# Patient Record
Sex: Female | Born: 1970
Health system: Southern US, Community
[De-identification: ages and names within clinical notes are randomized; demographics above are authoritative.]

## PROBLEM LIST (undated history)

## (undated) DIAGNOSIS — K219 Gastro-esophageal reflux disease without esophagitis: Secondary | ICD-10-CM

## (undated) DIAGNOSIS — Z87898 Personal history of other specified conditions: Secondary | ICD-10-CM

## (undated) DIAGNOSIS — Z973 Presence of spectacles and contact lenses: Secondary | ICD-10-CM

## (undated) DIAGNOSIS — N200 Calculus of kidney: Secondary | ICD-10-CM

## (undated) DIAGNOSIS — N1330 Unspecified hydronephrosis: Secondary | ICD-10-CM

## (undated) DIAGNOSIS — Z87442 Personal history of urinary calculi: Secondary | ICD-10-CM

## (undated) DIAGNOSIS — E785 Hyperlipidemia, unspecified: Secondary | ICD-10-CM

## (undated) DIAGNOSIS — I10 Essential (primary) hypertension: Secondary | ICD-10-CM

## (undated) DIAGNOSIS — D3501 Benign neoplasm of right adrenal gland: Secondary | ICD-10-CM

## (undated) DIAGNOSIS — I700 Atherosclerosis of aorta: Secondary | ICD-10-CM

## (undated) DIAGNOSIS — Z8669 Personal history of other diseases of the nervous system and sense organs: Secondary | ICD-10-CM

## (undated) DIAGNOSIS — R002 Palpitations: Secondary | ICD-10-CM

## (undated) DIAGNOSIS — E7800 Pure hypercholesterolemia, unspecified: Secondary | ICD-10-CM

## (undated) DIAGNOSIS — E119 Type 2 diabetes mellitus without complications: Secondary | ICD-10-CM

## (undated) DIAGNOSIS — K760 Fatty (change of) liver, not elsewhere classified: Secondary | ICD-10-CM

## (undated) DIAGNOSIS — Z8719 Personal history of other diseases of the digestive system: Secondary | ICD-10-CM

## (undated) DIAGNOSIS — I444 Left anterior fascicular block: Secondary | ICD-10-CM

## (undated) DIAGNOSIS — R102 Pelvic and perineal pain: Secondary | ICD-10-CM

## (undated) DIAGNOSIS — N2889 Other specified disorders of kidney and ureter: Secondary | ICD-10-CM

## (undated) DIAGNOSIS — F419 Anxiety disorder, unspecified: Secondary | ICD-10-CM

## (undated) HISTORY — DX: Pelvic and perineal pain: R10.2

## (undated) HISTORY — PX: ABDOMINAL HYSTERECTOMY: SHX81

## (undated) HISTORY — DX: Essential (primary) hypertension: I10

## (undated) HISTORY — DX: Type 2 diabetes mellitus without complications: E11.9

## (undated) HISTORY — PX: WISDOM TOOTH EXTRACTION: SHX21

## (undated) HISTORY — PX: ENDOMETRIAL ABLATION: SHX621

---

## 1999-03-18 ENCOUNTER — Inpatient Hospital Stay (HOSPITAL_COMMUNITY): Admission: AD | Admit: 1999-03-18 | Discharge: 1999-03-18 | Payer: Self-pay | Admitting: Obstetrics and Gynecology

## 1999-03-23 ENCOUNTER — Inpatient Hospital Stay (HOSPITAL_COMMUNITY): Admission: AD | Admit: 1999-03-23 | Discharge: 1999-03-26 | Payer: Self-pay | Admitting: Obstetrics and Gynecology

## 1999-08-05 ENCOUNTER — Other Ambulatory Visit: Admission: RE | Admit: 1999-08-05 | Discharge: 1999-08-05 | Payer: Self-pay | Admitting: Obstetrics and Gynecology

## 2000-08-08 ENCOUNTER — Other Ambulatory Visit: Admission: RE | Admit: 2000-08-08 | Discharge: 2000-08-08 | Payer: Self-pay | Admitting: Obstetrics and Gynecology

## 2000-09-04 ENCOUNTER — Ambulatory Visit (HOSPITAL_COMMUNITY): Admission: RE | Admit: 2000-09-04 | Discharge: 2000-09-04 | Payer: Self-pay | Admitting: Obstetrics and Gynecology

## 2000-09-04 ENCOUNTER — Encounter: Payer: Self-pay | Admitting: Obstetrics and Gynecology

## 2000-09-19 ENCOUNTER — Ambulatory Visit (HOSPITAL_COMMUNITY): Admission: RE | Admit: 2000-09-19 | Discharge: 2000-09-19 | Payer: Self-pay | Admitting: Obstetrics and Gynecology

## 2001-09-04 ENCOUNTER — Other Ambulatory Visit: Admission: RE | Admit: 2001-09-04 | Discharge: 2001-09-04 | Payer: Self-pay | Admitting: Obstetrics and Gynecology

## 2002-10-24 ENCOUNTER — Other Ambulatory Visit: Admission: RE | Admit: 2002-10-24 | Discharge: 2002-10-24 | Payer: Self-pay | Admitting: Obstetrics and Gynecology

## 2003-10-30 ENCOUNTER — Other Ambulatory Visit: Admission: RE | Admit: 2003-10-30 | Discharge: 2003-10-30 | Payer: Self-pay | Admitting: Obstetrics and Gynecology

## 2004-11-02 ENCOUNTER — Other Ambulatory Visit: Admission: RE | Admit: 2004-11-02 | Discharge: 2004-11-02 | Payer: Self-pay | Admitting: Obstetrics and Gynecology

## 2005-09-29 ENCOUNTER — Observation Stay (HOSPITAL_COMMUNITY): Admission: RE | Admit: 2005-09-29 | Discharge: 2005-09-30 | Payer: Self-pay | Admitting: Obstetrics and Gynecology

## 2005-09-29 ENCOUNTER — Encounter (INDEPENDENT_AMBULATORY_CARE_PROVIDER_SITE_OTHER): Payer: Self-pay | Admitting: Specialist

## 2005-10-09 HISTORY — PX: VAGINAL HYSTERECTOMY: SHX2639

## 2007-07-17 ENCOUNTER — Encounter: Admission: RE | Admit: 2007-07-17 | Discharge: 2007-07-17 | Payer: Self-pay | Admitting: Internal Medicine

## 2009-02-22 ENCOUNTER — Encounter: Admission: RE | Admit: 2009-02-22 | Discharge: 2009-02-22 | Payer: Self-pay | Admitting: Obstetrics and Gynecology

## 2009-09-28 ENCOUNTER — Ambulatory Visit (HOSPITAL_COMMUNITY): Admission: RE | Admit: 2009-09-28 | Discharge: 2009-09-29 | Payer: Self-pay | Admitting: Obstetrics and Gynecology

## 2011-01-09 LAB — CBC
HCT: 40.1 % (ref 36.0–46.0)
Hemoglobin: 11.5 g/dL — ABNORMAL LOW (ref 12.0–15.0)
Hemoglobin: 13.9 g/dL (ref 12.0–15.0)
MCHC: 34.7 g/dL (ref 30.0–36.0)
MCHC: 34.8 g/dL (ref 30.0–36.0)
MCV: 92.3 fL (ref 78.0–100.0)
Platelets: 287 10*3/uL (ref 150–400)
RBC: 3.53 MIL/uL — ABNORMAL LOW (ref 3.87–5.11)
RBC: 4.34 MIL/uL (ref 3.87–5.11)
RDW: 12.4 % (ref 11.5–15.5)
WBC: 10.9 10*3/uL — ABNORMAL HIGH (ref 4.0–10.5)
WBC: 22.1 10*3/uL — ABNORMAL HIGH (ref 4.0–10.5)

## 2011-01-09 LAB — APTT: aPTT: 27 seconds (ref 24–37)

## 2011-01-09 LAB — COMPREHENSIVE METABOLIC PANEL
Albumin: 3.7 g/dL (ref 3.5–5.2)
Alkaline Phosphatase: 89 U/L (ref 39–117)
BUN: 17 mg/dL (ref 6–23)
Calcium: 9.1 mg/dL (ref 8.4–10.5)
Creatinine, Ser: 0.9 mg/dL (ref 0.4–1.2)
Glucose, Bld: 81 mg/dL (ref 70–99)
Potassium: 3.5 mEq/L (ref 3.5–5.1)
Total Protein: 6.9 g/dL (ref 6.0–8.3)

## 2011-01-09 LAB — PROTIME-INR
INR: 0.92 (ref 0.00–1.49)
Prothrombin Time: 12.3 seconds (ref 11.6–15.2)

## 2011-02-24 NOTE — Discharge Summary (Signed)
Jane Montes, Jane Montes                 ACCOUNT NO.:  [id number]   MEDICAL RECORD NO.:  [id number]          PATIENT TYPE:  OBV   LOCATION:  9304                          FACILITY:  [facility]   PHYSICIAN:  Miguel Aschoff, M.D.       DATE OF BIRTH:  09-20-1971   DATE OF ADMISSION:  09/29/2005  DATE OF DISCHARGE:  09/30/2005                                 DISCHARGE SUMMARY   ADMISSION DIAGNOSES:  1.  Chronic pelvic pain.  2.  Pelvic endometriosis.   POSTOPERATIVE DIAGNOSES:  1.  Chronic pelvic pain.  2.  Pelvic endometriosis.   OPERATIONS AND PROCEDURES:  Laparoscopically assisted vaginal hysterectomy  and right salpingo-oophorectomy, general anesthesia.   BRIEF HISTORY:  The patient is a 40 year old white female with a history of  persistent chronic pelvic pain especially in the right lower quadrant,  refractory to outpatient therapy.  The patient previously had undergone  diagnostic laparoscopy and was noted to have endometriosis.  At the time of  that surgery, she underwent laser laparoscopic uterosacral nerve ablation  and had temporary relief and improvement of her pain, but the pain has now  reoccurred, and the patient is requesting definitive therapy for this  problem.  Options were discussed with the patient.  She has now elected to  undergo laparoscopically assisted vaginal hysterectomy, as well as possible  bilateral salpingo-oophorectomy in effort to eliminate and control her pain.   HOSPITAL COURSE:  Preoperative studies were obtained.  This revealed  admission hemoglobin of 13.3, white count of 8700.  Chem 7 profile was  within normal limits.  GI labs were within normal limits.  Under general  anesthesia on September 29, 2005, a laparoscopically assisted vaginal  hysterectomy was carried out, as well as a right salpingo-oophorectomy.  At  the time of surgery, there was only minimal endometriosis noted in the  pelvis, previously noted endometriosis involving the peritoneum of the  bladder appeared to have been resolved from the prior surgery.  The left  tube and ovary within normal limits.  There were few if any residual  implants of endometriosis noted in cul-de-sac.  Upper abdominal inspection  was unremarkable.  Surgery proceeded without difficulty.   The patient's postoperative course was essentially uncomplicated.  She  tolerated increased ambulation and diet well.  She had good pain control.  On September 30, 2005, her hemoglobin was 10.1.  White count 11,900.  Being  in satisfactory condition, ambulating well, and tolerating regular diet, the  patient was sent home on September 30, 2005.  She was instructed to do no  heavy lifting, to place nothing in the vagina, to call if there were any  problems such as fever, pain, or heavy bleeding.  The patient will be seen  back in four weeks for follow-up examination.  She was sent home on a  regular diet.   MEDICATIONS FOR HOME:  Tylox one every three hours as needed for pain.   Final pathology report on hysterectomy specimen is pending.      Miguel Aschoff, M.D.  Electronically Signed     AR/MEDQ  D:  10/02/2005  T:  10/02/2005  Job:  [id number]

## 2011-02-24 NOTE — Op Note (Signed)
[facility]  Patient:    Jane Montes, Jane Montes                          MRN: [id number] Proc. Date: 09/19/00 Attending:  Miguel Aschoff, M.D.                           Operative Report  PREOPERATIVE DIAGNOSIS:       Pelvic pain with dysmenorrhea.  POSTOPERATIVE DIAGNOSIS:      Pelvic endometriosis.  OPERATION:                    Diagnostic laparoscopy with laser ablation                               of endometriosis and laser ablation of                               uterosacral nerves.  SURGEON:                      Miguel Aschoff, M.D.  ANESTHESIA:                   General.  COMPLICATIONS:                None.  JUSTIFICATION:                The patient is a 40 year old white female with a history of persistent pelvic pain and worsening dysmenorrhea.  Outpatient evaluation has been unrevealing as to the etiology of this problem.  The patient requested that a diagnosis be made via laparoscopy, and that any treatment possibly via the laparoscope be undertaken.  She presents now for this procedure.  DESCRIPTION OF PROCEDURE:     The patient was taken to the operating room and placed in the supine position.  General anesthesia was administered without difficulty.  She was then placed in the dorsal lithotomy position.  She was prepped and draped in the usual sterile fashion.  The bladder was catheterized.  The Hulka tenaculum was placed through the cervix and held. After this was done, a small infraumbilical incision was made.  A Veress needle was inserted.  Then the abdomen was insufflated with 3 L of CO2 under monitored pressure.  Following this the trocar for the laparoscope was placed, followed by the laparoscope itself.  Then under direct visualization, a 5.0 mm puncture was made suprapubically, and a manipulating probe placed.  A systematic inspection of the pelvic organs showed a small amount of scarring on the anterior bladder peritoneum, consistent with  endometriosis.  The uterus was normal size and shape, and anterior.  The tubes were normal along their course; however, there was some blanching of the tube just adjacent to the cornual portion of the uterus on the left.  The fimbria were fine and delicate.  The ovaries were inspected and were noted to be within normal limits.  Inspection of the cul-de-sac revealed the presence of pale plaque-like lesions consistent with nonpigmented endometriosis.  There were only a few of these lesions present; however, they appeared characteristic for endometriosis.  The appendix was visualized and was noted to be within normal limits.  The liver was unremarkable.  The intestinal surfaces appeared to be within normal limits.  No other abnormalities were noted in the pelvis.  At this point, the Yag laser fiber was placed through the operating channel of the laparoscope, and using a GRP-6 tip, the implants that were visualized were carefully laser-ablated, with care to avoid any underlying structures.  In an effort to control the dysmenorrhea, a laser ablation of the uterosacral nerves was then carried out without difficulty, with the nerves being partially transected.  At this point, with no other abnormalities being noted, the procedure was completed.  There was excellent hemostasis.  The CO2 was allowed to escape.  All instruments were removed.  Then the small incisions were closed using subcuticular #4-0 Vicryl.  Following this, the port sites were injected with 0.25% Marcaine, and the procedure was completed.  The patient was reversed from the anesthetic, and taken to the recovery room in satisfactory condition. DD:  09/19/00 TD:  09/19/00 Job: [id number]

## 2011-02-24 NOTE — Op Note (Signed)
Jane Montes, Jane Montes                 ACCOUNT NO.:  [id number]   MEDICAL RECORD NO.:  [id number]          PATIENT TYPE:  AMB   LOCATION:  SDC                           FACILITY:  [facility]   PHYSICIAN:  Miguel Aschoff, M.D.       DATE OF BIRTH:  07-May-1971   DATE OF PROCEDURE:  09/29/2005  DATE OF DISCHARGE:                                 OPERATIVE REPORT   PREOPERATIVE DIAGNOSIS:  1.  Chronic pelvic pain.  2.  History of endometriosis.   POSTOPERATIVE DIAGNOSIS:  1.  Chronic pelvic pain.  2.  History of endometriosis.   PROCEDURE:  Laparoscopic-assisted vaginal hysterectomy, right salpingo-  oophorectomy.   SURGEON:  Dr. Miguel Aschoff.   ANESTHESIA:  General.   ASSISTANT:  Carrington Clamp, M.D.   COMPLICATIONS:  None.   JUSTIFICATION:  The patient is a 40 year old white female with history of  persistent chronic pelvic pain especially in the right lower quadrant.  The  patient was previously diagnosed with pelvic endometriosis in 2001, however,  now has recurrence of the pain. She presents now to undergo definitive  therapy via total vaginal hysterectomy and possible bilateral salpingo-  oophorectomy.  The risks and benefits of procedure were discussed with the  patient.   PROCEDURE:  The patient was taken to the operating and placed in supine  position. General anesthesia was administered without difficulty. She was  then prepped and draped in the usual sterile fashion. The bladder was then  catheterized. The Hulka tenaculum was placed through the cervix and held.  Attention was then directed to the umbilicus where a small infraumbilical  incision was made. Veress needle was then inserted and the abdomen was  insufflated 3 L of CO2.   Following this, the trocar to laparoscope was placed followed by laparoscope  itself. Inspection revealed uterus to be anterior, normal size and shape.  The anterior bladder, peritoneum at this point was within normal limits. The  ovaries appeared  be within normal limits. Tubes were normal along the  course. There were no adhesions noted in the cul-de-sac. There were no gross  implants of endometriosis noted.  _Peritoneal_________  surfaces appeared be  normal.   At this point, two accessory ports were placed in the right and left lower  quadrants under direct visualization.  These ports were 5 mm in size. Then  using the Gyrus tripolar unit, the right infundibulopelvic ligament was  identified, cauterized, and cut and dissection was continued along the meso-  ovarian ligament until the uterus was reached. The round ligament was  identified, cauterized, and cut and then additional bites were taken of  round ligament structures in the right side.  The left ovary was preserved.   The left utero-ovarian ligament and fallopian tube were grasped, cauterized,  and cut and round ligament was grasped, cauterized, and cut. Additional  bites taken of broad ligament structures with the tripolar unit, cauterized  and cut.  At this point with good hemostasis present, attention was directed  vaginally.   The patient was placed then lithotomy position. Weighted speculum was placed  in the vaginal vault. The cervical mucosa was then circumscribed and  dissected anteriorly and posteriorly until the peritoneal reflections were  found. Peritoneum was then entered anteriorly and posteriorly and using  curved Haney clamps, the uterosacral ligaments were clamped, cut, and suture  ligated using suture ligatures of zero Vicryl. Cardinal ligaments were  clamped, cut, and suture ligated in similar fashion. The uterine vessels  were found, clamped, cut, suture ligated using suture ligatures of zero  Vicryl. Additional bites were taken of the broad ligament structures until  the uterus, right tube, and ovary were freed.  The specimen was then  removed.   At this point all pedicles were inspected and appeared to be hemostatically  secure.  At this point  posterior cuff was run using running interlocking  zero Vicryl suture and peritoneum was closed using pursestring suture of  zero Vicryl and the vaginal mucosa was reapproximated using running  interlocking zero Vicryl suture. Hemostasis vaginally was excellent.   At this point attention was directed back the abdomen. The abdomen was  reinsufflated with CO2. Inspection was made of the operative site. Again  hemostasis appeared to be excellent.  There was no evidence of any active  bleeding or hematoma formation.  At this point all laparoscopic instruments  were removed and the small port sites were closed using subcuticular 3-0  Vicryl. Estimated blood loss was approximately 100 mL. The patient tolerated  the procedure well and went to the recovery in satisfactory condition.      Miguel Aschoff, M.D.  Electronically Signed     AR/MEDQ  D:  09/29/2005  T:  09/30/2005  Job:  [id number]

## 2011-08-28 ENCOUNTER — Other Ambulatory Visit: Payer: Self-pay | Admitting: Obstetrics and Gynecology

## 2011-08-28 DIAGNOSIS — R928 Other abnormal and inconclusive findings on diagnostic imaging of breast: Secondary | ICD-10-CM

## 2011-09-11 ENCOUNTER — Ambulatory Visit
Admission: RE | Admit: 2011-09-11 | Discharge: 2011-09-11 | Disposition: A | Discharge status: Home / Self Care | Payer: 59 | Source: Ambulatory Visit | Attending: Obstetrics and Gynecology | Admitting: Obstetrics and Gynecology

## 2011-09-11 DIAGNOSIS — R928 Other abnormal and inconclusive findings on diagnostic imaging of breast: Secondary | ICD-10-CM

## 2012-03-28 ENCOUNTER — Ambulatory Visit (INDEPENDENT_AMBULATORY_CARE_PROVIDER_SITE_OTHER): Payer: 59 | Admitting: Cardiovascular Disease

## 2012-03-28 ENCOUNTER — Encounter: Payer: Self-pay | Admitting: *Deleted

## 2012-03-28 VITALS — BP 138/88 | HR 66 | Ht 62.0 in | Wt 167.8 lb

## 2012-03-28 DIAGNOSIS — I10 Essential (primary) hypertension: Secondary | ICD-10-CM | POA: Insufficient documentation

## 2012-03-28 DIAGNOSIS — R42 Dizziness and giddiness: Secondary | ICD-10-CM | POA: Insufficient documentation

## 2012-03-28 NOTE — Assessment & Plan Note (Signed)
I Suspect that this weightlifting also may be contributing to her high blood pressure. She also has been under lots of stress with her home situation and this may be causing her blood pressure to be elevated.

## 2012-03-28 NOTE — Progress Notes (Signed)
    Jane Montes Date of Birth  [birth year]       [facility]    [place] [address]  [address] [place], [place]  [address]   [place], [place]  [address] [phone]     [phone]   Fax  [phone]    Fax [phone]  Problem List: 1. Palpitations 2. Hyperlipidemia   History of Present Illness:  Jane Montes is  a 41 year old female who presents today for followup of her palpitations and episodes of presyncope. She also is noted that her blood pressures been elevated. Is been under lots of stress recently. She's going through a separation. She's been exercising quite a bit and has been lifting a lot of weights. She is on a Paleo diet and has lost 28 pounds.  She's noticed lots of episodes of palpitations and lightheadedness. These primarily occur after she's been weight lifting. She lifts 280 pounds in a dead lift and this typically causes her to be dizzy and see "white spots".  She also has had some palpitations. These occurred at times are not necessarily related he any specific activity.  No current outpatient prescriptions on file prior to visit.    No Known Allergies  Past Medical History  Diagnosis Date  . Pelvic pain     Chronic, right lower quadrant    Past Surgical History  Procedure Date  . Endometrial ablation     Pelvic  . Vaginal hysterectomy     History  Smoking status  . Never Smoker   Smokeless tobacco  . Not on file    History  Alcohol Use: Not on file    No family history on file.  Reviw of Systems:  Reviewed in the HPI.  All other systems are negative.  Physical Exam: Blood pressure 138/88, pulse 66, height 5\' 2"  (1.575 m), weight 167 lb 12.8 oz (76.114 kg). General: Well developed, well nourished, in no acute distress.  Head: Normocephalic, atraumatic, sclera non-icteric, mucus membranes are moist,   Neck: Supple. Carotids are 2 + without bruits. No JVD  Lungs: Clear bilaterally to  auscultation.  Heart: regular rate.  normal  S1 S2. No murmurs, gallops or rubs.  Abdomen: Soft, non-tender, non-distended with normal bowel sounds. No hepatomegaly. No rebound/guarding. No masses.  Msk:  Strength and tone are normal  Extremities: No clubbing or cyanosis. No edema.  Distal pedal pulses are 2+ and equal bilaterally.  Neuro: Alert and oriented X 3. Moves all extremities spontaneously.  Psych:  Responds to questions appropriately with a normal affect.  ECG: March 28, 2012-normal sinus rhythm at 66 beats a minute. She has no ST or T wave changes.  Assessment / Plan:

## 2012-03-28 NOTE — Assessment & Plan Note (Signed)
Jane Montes presents today for further evaluation of her dizziness. To the point of being dizzy especially after lifting heavy weights. She's lifting as much as 280 pounds when she does her dead lifts. This certainly is causing temporary reduction of her right atrial filling which is the cause of these  "gray out spells".  I've asked her to limit the amount of weight that she lives. I've asked her to make sure that she start her workouts very hydrated.  I've asked her to work toward lifting much lighter weights and doing more repetitions.

## 2012-03-28 NOTE — Patient Instructions (Addendum)
Your physician recommends that you schedule a follow-up appointment in: 3 MONTHS  Your physician recommends that you continue on your current medications as directed. Please refer to the Current Medication list given to you today.   

## 2012-06-25 ENCOUNTER — Ambulatory Visit: Payer: 59 | Admitting: Cardiovascular Disease

## 2013-01-06 ENCOUNTER — Encounter (HOSPITAL_COMMUNITY): Payer: Self-pay | Admitting: Emergency Medicine

## 2013-01-06 ENCOUNTER — Emergency Department (HOSPITAL_COMMUNITY)
Admission: EM | Admit: 2013-01-06 | Discharge: 2013-01-07 | Disposition: A | Discharge status: Home / Self Care | Payer: 59 | Attending: Emergency Medicine | Admitting: Emergency Medicine

## 2013-01-06 ENCOUNTER — Emergency Department (HOSPITAL_COMMUNITY): Payer: 59

## 2013-01-06 DIAGNOSIS — R42 Dizziness and giddiness: Secondary | ICD-10-CM | POA: Insufficient documentation

## 2013-01-06 DIAGNOSIS — I951 Orthostatic hypotension: Secondary | ICD-10-CM | POA: Insufficient documentation

## 2013-01-06 DIAGNOSIS — R000 Tachycardia, unspecified: Secondary | ICD-10-CM | POA: Insufficient documentation

## 2013-01-06 DIAGNOSIS — Z8742 Personal history of other diseases of the female genital tract: Secondary | ICD-10-CM | POA: Insufficient documentation

## 2013-01-06 DIAGNOSIS — Z3202 Encounter for pregnancy test, result negative: Secondary | ICD-10-CM | POA: Insufficient documentation

## 2013-01-06 LAB — URINALYSIS, ROUTINE W REFLEX MICROSCOPIC
Leukocytes, UA: NEGATIVE
Protein, ur: NEGATIVE mg/dL
Specific Gravity, Urine: 1.02 (ref 1.005–1.030)
Urobilinogen, UA: 0.2 mg/dL (ref 0.0–1.0)

## 2013-01-06 LAB — CBC
Hemoglobin: 15 g/dL (ref 12.0–15.0)
MCH: 31.4 pg (ref 26.0–34.0)
MCV: 86.6 fL (ref 78.0–100.0)
Platelets: 311 10*3/uL (ref 150–400)
RBC: 4.77 MIL/uL (ref 3.87–5.11)
WBC: 12.5 10*3/uL — ABNORMAL HIGH (ref 4.0–10.5)

## 2013-01-06 LAB — BASIC METABOLIC PANEL
CO2: 23 mEq/L (ref 19–32)
Chloride: 102 mEq/L (ref 96–112)
Creatinine, Ser: 1.22 mg/dL — ABNORMAL HIGH (ref 0.50–1.10)
Glucose, Bld: 94 mg/dL (ref 70–99)
Sodium: 138 mEq/L (ref 135–145)

## 2013-01-06 LAB — POCT I-STAT TROPONIN I: Troponin i, poc: 0 ng/mL (ref 0.00–0.08)

## 2013-01-06 LAB — PREGNANCY, URINE: Preg Test, Ur: NEGATIVE

## 2013-01-06 MED ORDER — SODIUM CHLORIDE 0.9 % IV BOLUS (SEPSIS)
1000.0000 mL | Freq: Once | INTRAVENOUS | Status: AC
  Administered 2013-01-06: 1000 mL via INTRAVENOUS

## 2013-01-06 MED ORDER — MECLIZINE HCL 25 MG PO TABS
25.0000 mg | ORAL_TABLET | Freq: Once | ORAL | Status: AC
  Administered 2013-01-06: 25 mg via ORAL
  Filled 2013-01-06: qty 1

## 2013-01-06 MED ORDER — ONDANSETRON HCL 4 MG/2ML IJ SOLN
4.0000 mg | Freq: Once | INTRAMUSCULAR | Status: AC
  Administered 2013-01-06: 4 mg via INTRAVENOUS
  Filled 2013-01-06: qty 2

## 2013-01-06 MED ORDER — LORAZEPAM 2 MG/ML IJ SOLN
1.0000 mg | Freq: Once | INTRAMUSCULAR | Status: AC
  Administered 2013-01-06: 1 mg via INTRAVENOUS
  Filled 2013-01-06: qty 1

## 2013-01-06 NOTE — ED Provider Notes (Signed)
History  This chart was scribed for Glynn Octave, MD by Shari Heritage, ED Scribe. The patient was seen in room A03C/A03C. Patient's care was started at 2134.  CSN: [id number]  Arrival date & time 01/06/13  2004   First MD Initiated Contact with Patient 01/06/13 2134      Chief Complaint  Patient presents with  . Chest Pain    The history is provided by the patient. No language interpreter was used.    HPI Comments: Jane Montes is a 42 y.o. female who presents to the Emergency Department complaining of moderate, episodic, left anterior chest pain with radiation into her right arm and associated shortness of breath onset 2-3 hours ago. She states that chest pains episodes have lasted less than 1 minute. She says that she is not experiencing chest pain or shortness of breath at this time. At current, patient is having nausea and lightheadedness. She describes lightheadedness as faintness - feeling like she might pass out. Patient says that she was at the gym warming up before a workout when she began to feel lightheaded. She says that she lied down for several minutes, but sensation continued. Patient was traveling to Urgent Care for evaluation when she began to have chest pain and shortness of breath. There is no abdominal pain, leg pain or vomiting. Patient says that she usually works out 4-5 times a week. She denies prior history of similar chest pain. She denies history of diabetes, hypertension, anxiety or panic attack. Patient had a stress test several years ago that was normal. She takes only multivitamins daily, no other medicines. She does not smoke or use alcohol.   Past Medical History  Diagnosis Date  . Pelvic pain     Chronic, right lower quadrant    Past Surgical History  Procedure Laterality Date  . Endometrial ablation      Pelvic  . Vaginal hysterectomy      History reviewed. No pertinent family history.  History  Substance Use Topics  . Smoking status: Never  Smoker   . Smokeless tobacco: Not on file  . Alcohol Use: No    OB History   Grav Para Term Preterm Abortions TAB SAB Ect Mult Living                  Review of Systems A complete 10 system review of systems was obtained and all systems are negative except as noted in the HPI and PMH.   Allergies  Review of patient's allergies indicates no known allergies.  Home Medications   Current Outpatient Rx  Name  Route  Sig  Dispense  Refill  . Multiple Vitamins-Minerals (MULTIVITAMIN PO)   Oral   Take 1 tablet by mouth daily.          . meclizine (ANTIVERT) 12.5 MG tablet   Oral   Take 1 tablet (12.5 mg total) by mouth 3 (three) times daily as needed.   30 tablet   0   . ondansetron (ZOFRAN) 4 MG tablet   Oral   Take 1 tablet (4 mg total) by mouth every 6 (six) hours.   12 tablet   0     Triage Vitals: BP 155/101  Pulse 113  Temp(Src) 98.2 F (36.8 C) (Oral)  Resp 22  SpO2 100%  Physical Exam  Constitutional: She is oriented to person, place, and time. She appears well-developed and well-nourished.  HENT:  Head: Normocephalic and atraumatic.  Mouth/Throat: Oropharynx is clear and moist and  mucous membranes are normal. Mucous membranes are not dry.  Eyes: Conjunctivae and EOM are normal. Pupils are equal, round, and reactive to light. Right eye exhibits no nystagmus. Left eye exhibits no nystagmus.  No nystagmus  Neck: Normal range of motion. Neck supple.  No meningismus.  Cardiovascular: Regular rhythm and normal heart sounds.  Tachycardia present.   No murmur heard. Pulmonary/Chest: Effort normal and breath sounds normal. She exhibits no tenderness.  Abdominal: Soft. Bowel sounds are normal. She exhibits no distension. There is no tenderness.  Musculoskeletal: Normal range of motion.  Neurological: She is alert and oriented to person, place, and time.  5/5 strength throughout. No ataxia on finger to nose. CN 2-12 intact.  Head impulse testing negative Test of  skew negative  Skin: Skin is warm and dry. No rash noted.  Psychiatric: Her behavior is normal.  Appears anxious.    ED Course  Procedures (including critical care time) DIAGNOSTIC STUDIES: Oxygen Saturation is 100% on room air, normal by my interpretation.    COORDINATION OF CARE: 9:47 PM- Patient informed of current plan for treatment and evaluation and agrees with plan at this time.    Labs Reviewed  CBC - Abnormal; Notable for the following:    WBC 12.5 (*)    MCHC 36.3 (*)    All other components within normal limits  BASIC METABOLIC PANEL - Abnormal; Notable for the following:    Creatinine, Ser 1.22 (*)    GFR calc non Af Amer 54 (*)    GFR calc Af Amer 63 (*)    All other components within normal limits  URINALYSIS, ROUTINE W REFLEX MICROSCOPIC - Abnormal; Notable for the following:    APPearance HAZY (*)    Ketones, ur 15 (*)    All other components within normal limits  D-DIMER, QUANTITATIVE  PREGNANCY, URINE  TROPONIN I  POCT I-STAT TROPONIN I    Dg Chest 2 View  01/06/2013  *RADIOLOGY REPORT*  Clinical Data: Chest pain.  Nausea.  Dizziness.  CHEST - 2 VIEW  Comparison: None.  Findings: Cardiomediastinal silhouette unremarkable.   Lungs clear. Bronchovascular markings normal.  Pulmonary vascularity normal.  No pneumothorax.  No pleural effusions.  Degenerative changes involving the mid thoracic spine.  IMPRESSION: No acute cardiopulmonary disease.   Original Report Authenticated By: Hulan Saas, M.D.    Ct Head Wo Contrast  01/07/2013  *RADIOLOGY REPORT*  Clinical Data: Dizziness and nausea tonight.  CT HEAD WITHOUT CONTRAST  Technique:  Contiguous axial images were obtained from the base of the skull through the vertex without contrast.  Comparison: None.  Findings: The ventricles and sulci are symmetrical without significant effacement, displacement, or dilatation. No mass effect or midline shift. No abnormal extra-axial fluid collections. The grey-white matter  junction is distinct. Basal cisterns are not effaced. No acute intracranial hemorrhage. No depressed skull fractures.  Visualized paranasal sinuses and mastoid air cells are not opacified.  IMPRESSION: No acute intracranial abnormalities.   Original Report Authenticated By: Burman Nieves, M.D.      1. Dizziness   2. Orthostatic hypotension       MDM  Episode of dizziness and nausea while working out at [employer] Improves with lying down. Intermittent left-sided chest pain started on the way to the hospital lasting a few minutes at a time. No radiation. No shortness of breath, vomiting, cough or fever. Previous cardiology visit for dizziness attributed to weight lifting. Denies headache, neck pain, vision change.   EKG nonischemic. Chest  pain is atypical for ACS or PE. Orthostatics are positive.  Patient given IV hydration, antiemetics, and anxiolytics in the ED. previous cardiology note reviewed. IMprovement of symptoms in ED without vomiting.  Tolerating PO and ambulatory. D-dimer and troponin negative. Doubt significant arrhythmia or CNS pathology.   Date: 01/06/2013  Rate: 107  Rhythm: sinus tachycardia  QRS Axis: left  Intervals: normal  ST/T Wave abnormalities: normal  Conduction Disutrbances:none  Narrative Interpretation:   Old EKG Reviewed: none available   Date: 01/06/2013  Rate: 86  Rhythm: normal sinus rhythm  QRS Axis: normal  Intervals: normal  ST/T Wave abnormalities: normal  Conduction Disutrbances:none  Narrative Interpretation: no brugada, no prolonged QT  Old EKG Reviewed: unchanged       I personally performed the services described in this documentation, which was scribed in my presence. The recorded information has been reviewed and is accurate.      Glynn Octave, MD 01/07/13 1242

## 2013-01-06 NOTE — ED Notes (Signed)
The pt has had dizziness and nausea since she was working out [employer].  When upright she has more dizziness and nausea.  Similar chest pain in the past but not as severe.  Alert orineted skin warm and dry.  Iv nss bolus and nausea med given.  Family at the bedside

## 2013-01-06 NOTE — ED Notes (Signed)
Patient reports taht she was in the gym working out tonight when she became really dizzy.  Patient lied down for a little bit.  Patient reports several episodes of this over the last few weeks, but not as bad as tonight.  Dizziness continued tonight.  Enroute to hospital, patient began to have chest pain and shortness of breath.

## 2013-01-07 ENCOUNTER — Emergency Department (HOSPITAL_COMMUNITY): Payer: 59

## 2013-01-07 MED ORDER — MECLIZINE HCL 12.5 MG PO TABS: 12.5000 mg | ORAL_TABLET | Freq: Three times a day (TID) | ORAL | Status: DC | PRN

## 2013-01-07 MED ORDER — ONDANSETRON HCL 4 MG PO TABS: 4.0000 mg | ORAL_TABLET | Freq: Four times a day (QID) | ORAL | Status: DC

## 2013-01-07 NOTE — ED Notes (Signed)
Pt in CT at this time.

## 2013-01-09 ENCOUNTER — Telehealth: Payer: Self-pay | Admitting: *Deleted

## 2013-01-09 ENCOUNTER — Ambulatory Visit (INDEPENDENT_AMBULATORY_CARE_PROVIDER_SITE_OTHER): Payer: 59 | Admitting: Cardiovascular Disease

## 2013-01-09 ENCOUNTER — Encounter: Payer: Self-pay | Admitting: Cardiovascular Disease

## 2013-01-09 VITALS — BP 132/92 | HR 83 | Ht 62.0 in | Wt 178.0 lb

## 2013-01-09 DIAGNOSIS — R0789 Other chest pain: Secondary | ICD-10-CM | POA: Insufficient documentation

## 2013-01-09 DIAGNOSIS — R42 Dizziness and giddiness: Secondary | ICD-10-CM

## 2013-01-09 LAB — HEPATIC FUNCTION PANEL
ALT: 18 U/L (ref 0–35)
AST: 20 U/L (ref 0–37)
Alkaline Phosphatase: 74 U/L (ref 39–117)
Bilirubin, Direct: 0.1 mg/dL (ref 0.0–0.3)
Total Bilirubin: 0.6 mg/dL (ref 0.3–1.2)
Total Protein: 7.4 g/dL (ref 6.0–8.3)

## 2013-01-09 LAB — BASIC METABOLIC PANEL
BUN: 16 mg/dL (ref 6–23)
Calcium: 8.8 mg/dL (ref 8.4–10.5)
Creatinine, Ser: 1 mg/dL (ref 0.4–1.2)
GFR: 62.55 mL/min (ref >60.00)
Potassium: 3.7 mEq/L (ref 3.5–5.1)

## 2013-01-09 LAB — LIPID PANEL: Cholesterol: 186 mg/dL (ref 0–200)

## 2013-01-09 NOTE — Progress Notes (Signed)
Jane Montes Date of Birth  [birth year]       [facility]    [place] [place] [address]  [address], [address] [place], [place]  [address]   [place], [place]  [address] [phone]     [phone]   Fax  [phone]    Fax [phone]  Problem List: 1. Palpitations 2. Hyperlipidemia 3. Dizziness  History of Present Illness:  Jane Montes is  a 42 year old female who presents today for followup of her palpitations and episodes of presyncope. She also is noted that her blood pressures been elevated. Is been under lots of stress recently. She's going through a separation. She's been exercising quite a bit and has been lifting a lot of weights. She is on a Paleo diet and has lost 28 pounds.  She's noticed lots of episodes of palpitations and lightheadedness. These primarily occur after she's been weight lifting. She lifts 280 pounds in a dead lift and this typically causes her to be dizzy and see "white spots".  She also has had some palpitations. These occurred at times are not necessarily related he any specific activity.  January 09, 2013:  She has continued to have dizziness and " white spots" in her vision.  She has been seen in the ER.   When she was on her way into the ER she had an  episode of CP / pressure.  Radiation to her right arm.  The pain was intermitant for hours.  Troponins were negtive, head CT was normal.  She has these episodes at work - she has hooked herself up to the monitor and has normal rhythm.  Her BP has also been normal during these episodes.    She broke her foot in January, 2014.  She had previously been doing lots of cardio exercise but has had to back off schedule.  She has continued to do her Cross-Fit workouts without chest pain.   We did a stress test years ago  Current Outpatient Prescriptions on File Prior to Visit  Medication Sig Dispense Refill  . meclizine (ANTIVERT) 12.5 MG tablet Take 1 tablet (12.5 mg total) by  mouth 3 (three) times daily as needed.  30 tablet  0  . Multiple Vitamins-Minerals (MULTIVITAMIN PO) Take 1 tablet by mouth daily.       . ondansetron (ZOFRAN) 4 MG tablet Take 1 tablet (4 mg total) by mouth every 6 (six) hours.  12 tablet  0   No current facility-administered medications on file prior to visit.    No Known Allergies  Past Medical History  Diagnosis Date  . Pelvic pain     Chronic, right lower quadrant    Past Surgical History  Procedure Laterality Date  . Endometrial ablation      Pelvic  . Vaginal hysterectomy      History  Smoking status  . Never Smoker   Smokeless tobacco  . Not on file    History  Alcohol Use No    No family history on file.  Reviw of Systems:  Reviewed in the HPI.  All other systems are negative.  Physical Exam: Blood pressure 132/92, pulse 83, height 5\' 2"  (1.575 m), weight 178 lb (80.74 kg), SpO2 99.00%. General: Well developed, well nourished, in no acute distress.  Head: Normocephalic, atraumatic, sclera non-icteric, mucus membranes are moist,   Neck: Supple. Carotids are 2 + without bruits. No JVD  Lungs: Clear bilaterally to auscultation.  Heart: regular rate.  normal  S1 S2. No murmurs, gallops or rubs.  Abdomen: Soft, non-tender, non-distended with normal bowel sounds. No hepatomegaly. No rebound/guarding. No masses.  Msk:  Strength and tone are normal  Extremities: No clubbing or cyanosis. No edema.  Distal pedal pulses are 2+ and equal bilaterally.  Neuro: Alert and oriented X 3. Moves all extremities spontaneously.  Psych:  Responds to questions appropriately with a normal affect.  ECG: January 06, 2013:  Sinus tach, no ST or T wave changes.  Assessment / Plan:

## 2013-01-09 NOTE — Assessment & Plan Note (Signed)
Denetta presents today after having an episode of chest discomfort. The discomfort occurred after she had been working out in [employer] It was associated with some dizziness and "white spots" in front of her eyes. The discomfort lasted intermittently for several hours. There was some radiation to her right arm. She has a family history of coronary artery disease-her mother had coronary disease.  She has a history of hyperlipidemia.  I think it would be important to rule out coronary artery disease. I went to do a stress echocardiogram. This will also allow [place] to view her left ventricular function and get some idea of her volume. I suspect some of her dizziness and visual changes are due to dehydration/volume depletion.  We'll check fasting lipids today. I'll see her back in the office in one to 2 months.

## 2013-01-09 NOTE — Assessment & Plan Note (Signed)
Jane Montes presents with episodes of dizziness. These typically occur after she's been lifting heavy weights but now started occurring at other times.  We will get a stress echo for further evaluation.   I have encouraged her to drink more fluids - including electrolyte containing drinks.   I do not think these episodes are due to an arrhythmia.

## 2013-01-09 NOTE — Patient Instructions (Addendum)
Your physician has requested that you have a stress echocardiogram.  Please follow instruction sheet as given.  Your physician recommends that you return for a  lipid profile: today   Your physician recommends that you schedule a follow-up appointment in: 1-2 months with Dr Elease Hashimoto  Your physician recommends that you continue on your current medications as directed. Please refer to the Current Medication list given to you today.

## 2013-01-09 NOTE — Telephone Encounter (Signed)
Received paperwork for her today, papers taken to MR, Nadean Corwin will send out for processing.

## 2013-01-10 ENCOUNTER — Ambulatory Visit (HOSPITAL_COMMUNITY): Payer: 59 | Attending: Cardiology

## 2013-01-10 ENCOUNTER — Ambulatory Visit (HOSPITAL_COMMUNITY): Payer: 59 | Attending: Cardiology | Admitting: Radiology

## 2013-01-10 ENCOUNTER — Encounter: Payer: Self-pay | Admitting: Cardiovascular Disease

## 2013-01-10 DIAGNOSIS — R0789 Other chest pain: Secondary | ICD-10-CM

## 2013-01-10 DIAGNOSIS — R079 Chest pain, unspecified: Secondary | ICD-10-CM | POA: Insufficient documentation

## 2013-01-10 DIAGNOSIS — R072 Precordial pain: Secondary | ICD-10-CM

## 2013-01-10 DIAGNOSIS — R42 Dizziness and giddiness: Secondary | ICD-10-CM | POA: Insufficient documentation

## 2013-01-10 DIAGNOSIS — I444 Left anterior fascicular block: Secondary | ICD-10-CM

## 2013-01-10 DIAGNOSIS — R0989 Other specified symptoms and signs involving the circulatory and respiratory systems: Secondary | ICD-10-CM

## 2013-01-10 HISTORY — DX: Left anterior fascicular block: I44.4

## 2013-01-10 NOTE — Progress Notes (Signed)
Echocardiogram performed.  

## 2013-01-15 ENCOUNTER — Telehealth: Payer: Self-pay | Admitting: Cardiovascular Disease

## 2013-01-15 NOTE — Telephone Encounter (Signed)
Mailed Pt lab's to Home address  01/15/13/KM

## 2013-03-07 ENCOUNTER — Ambulatory Visit: Payer: 59 | Admitting: Cardiovascular Disease

## 2013-03-13 ENCOUNTER — Other Ambulatory Visit: Payer: Self-pay | Admitting: Obstetrics and Gynecology

## 2014-10-16 ENCOUNTER — Other Ambulatory Visit: Payer: Self-pay | Admitting: Family Medicine

## 2014-10-16 ENCOUNTER — Other Ambulatory Visit (HOSPITAL_COMMUNITY): Payer: Self-pay | Admitting: Family Medicine

## 2014-10-16 DIAGNOSIS — R1011 Right upper quadrant pain: Secondary | ICD-10-CM

## 2014-10-19 ENCOUNTER — Ambulatory Visit
Admission: RE | Admit: 2014-10-19 | Discharge: 2014-10-19 | Disposition: A | Discharge status: Home / Self Care | Payer: 59 | Source: Ambulatory Visit | Attending: Family Medicine | Admitting: Family Medicine

## 2014-10-19 DIAGNOSIS — R1011 Right upper quadrant pain: Secondary | ICD-10-CM

## 2015-09-13 ENCOUNTER — Other Ambulatory Visit: Payer: Self-pay | Admitting: Gastroenterology

## 2015-09-13 ENCOUNTER — Other Ambulatory Visit (HOSPITAL_COMMUNITY): Payer: Self-pay | Admitting: Gastroenterology

## 2015-09-13 DIAGNOSIS — R112 Nausea with vomiting, unspecified: Secondary | ICD-10-CM

## 2015-09-13 DIAGNOSIS — R1111 Vomiting without nausea: Secondary | ICD-10-CM

## 2015-09-16 ENCOUNTER — Ambulatory Visit
Admission: RE | Admit: 2015-09-16 | Discharge: 2015-09-16 | Disposition: A | Discharge status: Home / Self Care | Payer: 59 | Source: Ambulatory Visit | Attending: Gastroenterology | Admitting: Gastroenterology

## 2015-09-16 DIAGNOSIS — R112 Nausea with vomiting, unspecified: Secondary | ICD-10-CM

## 2015-09-16 DIAGNOSIS — Z8719 Personal history of other diseases of the digestive system: Secondary | ICD-10-CM

## 2015-09-16 DIAGNOSIS — K219 Gastro-esophageal reflux disease without esophagitis: Secondary | ICD-10-CM

## 2015-09-16 HISTORY — DX: Personal history of other diseases of the digestive system: Z87.19

## 2015-09-16 HISTORY — DX: Gastro-esophageal reflux disease without esophagitis: K21.9

## 2015-09-20 ENCOUNTER — Ambulatory Visit (HOSPITAL_COMMUNITY)
Admission: RE | Admit: 2015-09-20 | Discharge: 2015-09-20 | Disposition: A | Discharge status: Home / Self Care | Payer: 59 | Source: Ambulatory Visit | Attending: Gastroenterology | Admitting: Gastroenterology

## 2015-09-20 DIAGNOSIS — K219 Gastro-esophageal reflux disease without esophagitis: Secondary | ICD-10-CM | POA: Diagnosis not present

## 2015-09-20 DIAGNOSIS — R140 Abdominal distension (gaseous): Secondary | ICD-10-CM | POA: Diagnosis not present

## 2015-09-20 DIAGNOSIS — R1110 Vomiting, unspecified: Secondary | ICD-10-CM | POA: Diagnosis present

## 2015-09-20 DIAGNOSIS — R1111 Vomiting without nausea: Secondary | ICD-10-CM

## 2015-09-20 MED ORDER — TECHNETIUM TC 99M SULFUR COLLOID
2.0000 | Freq: Once | INTRAVENOUS | Status: AC | PRN
  Administered 2015-09-20: 2 via INTRAVENOUS

## 2017-07-31 ENCOUNTER — Other Ambulatory Visit: Payer: Self-pay | Admitting: Obstetrics and Gynecology

## 2017-07-31 DIAGNOSIS — N6310 Unspecified lump in the right breast, unspecified quadrant: Secondary | ICD-10-CM

## 2017-08-06 ENCOUNTER — Ambulatory Visit
Admission: RE | Admit: 2017-08-06 | Discharge: 2017-08-06 | Disposition: A | Discharge status: Home / Self Care | Payer: 59 | Source: Ambulatory Visit | Attending: Obstetrics and Gynecology | Admitting: Obstetrics and Gynecology

## 2017-08-06 DIAGNOSIS — N6310 Unspecified lump in the right breast, unspecified quadrant: Secondary | ICD-10-CM

## 2017-08-06 IMAGING — US ULTRASOUND RIGHT BREAST LIMITED
1 series · 2 of 2 positions shown · non-contrast
Comparison: Previous exams

CLINICAL DATA: 46-year-old patient with a palpable area of concern
in the 9 o'clock region of the right breast. Due for annual exam.

EXAM:
2D DIGITAL DIAGNOSTIC BILATERAL MAMMOGRAM WITH CAD AND ADJUNCT TOMO
ULTRASOUND RIGHT BREAST

[Series 1: ultrasound right breast limited · 0.07mm/px · 2 of 2 slices shown]
[im 1/2]
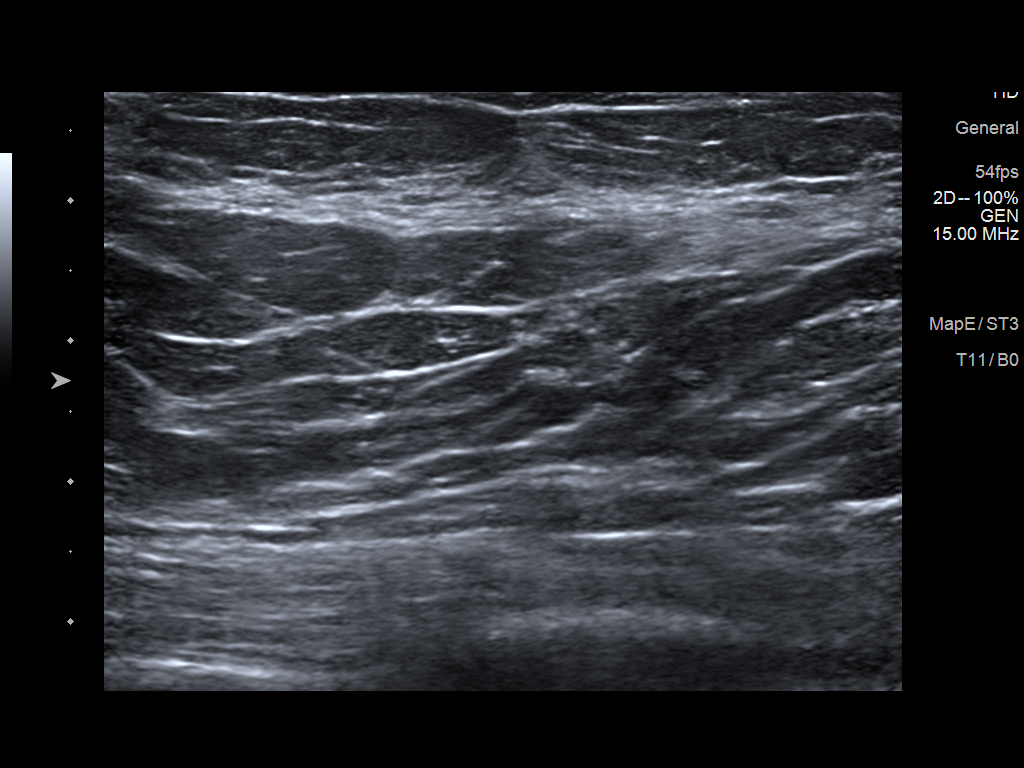
[im 2/2]
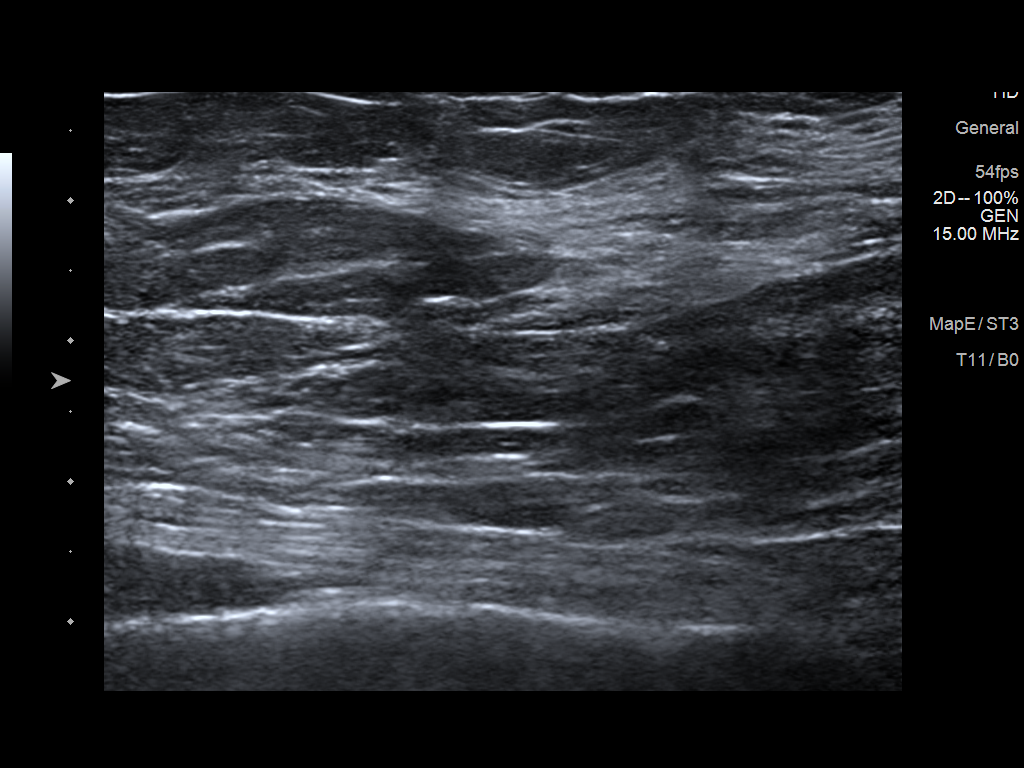

[2 of 2 positions shown; findings below may reference images not displayed]

ACR Breast Density Category b: There are scattered areas of
fibroglandular density.
FINDINGS: No mass, architectural distortion, or suspicious microcalcification
is identified to suggest malignancy in either breast.

Mammographic images were processed with CAD.

Targeted ultrasound is performed, showing normal fibroglandular
tissue. No solid or cystic mass or abnormal shadowing is identified
to suggest malignancy.
IMPRESSION: No evidence of malignancy in either breast. Normal fibroglandular
tissue is seen on mammogram and ultrasound in the region of patient
concern in the outer right breast.

RECOMMENDATION:
Screening mammogram in one year.(Code:[03])

I have discussed the findings and recommendations with the patient.
Results were also provided in writing at the conclusion of the
visit. If applicable, a reminder letter will be sent to the patient
regarding the next appointment.

BI-RADS CATEGORY  1: Negative.

## 2018-04-02 ENCOUNTER — Ambulatory Visit (INDEPENDENT_AMBULATORY_CARE_PROVIDER_SITE_OTHER): Payer: 59 | Admitting: Family

## 2018-04-02 ENCOUNTER — Encounter: Payer: Self-pay | Admitting: Family

## 2018-04-02 VITALS — BP 109/70 | HR 88 | Temp 98.4°F | Resp 16 | Ht 63.0 in | Wt 194.6 lb

## 2018-04-02 DIAGNOSIS — Z0000 Encounter for general adult medical examination without abnormal findings: Secondary | ICD-10-CM | POA: Diagnosis not present

## 2018-04-02 LAB — CBC WITH DIFFERENTIAL/PLATELET
BASOS ABS: 0.1 10*3/uL (ref 0.0–0.1)
Basophils Relative: 1.1 % (ref 0.0–3.0)
Eosinophils Absolute: 0.2 10*3/uL (ref 0.0–0.7)
Eosinophils Relative: 2.4 % (ref 0.0–5.0)
HCT: 40 % (ref 36.0–46.0)
Hemoglobin: 13.8 g/dL (ref 12.0–15.0)
LYMPHS ABS: 2.5 10*3/uL (ref 0.7–4.0)
Lymphocytes Relative: 24.4 % (ref 12.0–46.0)
MCHC: 34.5 g/dL (ref 30.0–36.0)
MCV: 91.8 fl (ref 78.0–100.0)
MONO ABS: 0.9 10*3/uL (ref 0.1–1.0)
Monocytes Relative: 8.3 % (ref 3.0–12.0)
NEUTROS PCT: 63.8 % (ref 43.0–77.0)
Neutro Abs: 6.6 10*3/uL (ref 1.4–7.7)
Platelets: 320 10*3/uL (ref 150.0–400.0)
RBC: 4.36 Mil/uL (ref 3.87–5.11)
RDW: 12.9 % (ref 11.5–15.5)
WBC: 10.3 10*3/uL (ref 4.0–10.5)

## 2018-04-02 LAB — LIPID PANEL
CHOL/HDL RATIO: 7
Cholesterol: 277 mg/dL — ABNORMAL HIGH (ref 0–200)
HDL: 41.2 mg/dL (ref >39.00)
Triglycerides: 737 mg/dL — ABNORMAL HIGH (ref 0.0–149.0)

## 2018-04-02 LAB — BASIC METABOLIC PANEL
BUN: 20 mg/dL (ref 6–23)
CALCIUM: 9.6 mg/dL (ref 8.4–10.5)
CO2: 27 mEq/L (ref 19–32)
Chloride: 102 mEq/L (ref 96–112)
Creatinine, Ser: 0.94 mg/dL (ref 0.40–1.20)
GFR: 67.86 mL/min (ref >60.00)
GLUCOSE: 95 mg/dL (ref 70–99)
Potassium: 3.9 mEq/L (ref 3.5–5.1)
Sodium: 137 mEq/L (ref 135–145)

## 2018-04-02 LAB — URINALYSIS, ROUTINE W REFLEX MICROSCOPIC
Bilirubin Urine: NEGATIVE
KETONES UR: NEGATIVE
LEUKOCYTES UA: NEGATIVE
Nitrite: NEGATIVE
PH: 6 (ref 5.0–8.0)
SPECIFIC GRAVITY, URINE: 1.025 (ref 1.000–1.030)
Total Protein, Urine: NEGATIVE
Urine Glucose: NEGATIVE
Urobilinogen, UA: 0.2 (ref 0.0–1.0)

## 2018-04-02 LAB — HEPATIC FUNCTION PANEL
ALT: 14 U/L (ref 0–35)
AST: 13 U/L (ref 0–37)
Albumin: 4.4 g/dL (ref 3.5–5.2)
Alkaline Phosphatase: 76 U/L (ref 39–117)
BILIRUBIN TOTAL: 0.4 mg/dL (ref 0.2–1.2)
Bilirubin, Direct: 0.1 mg/dL (ref 0.0–0.3)
Total Protein: 7 g/dL (ref 6.0–8.3)

## 2018-04-02 LAB — TSH: TSH: 1.66 u[IU]/mL (ref 0.35–4.50)

## 2018-04-02 LAB — LDL CHOLESTEROL, DIRECT: Direct LDL: 105 mg/dL

## 2018-04-02 MED ORDER — LISINOPRIL 5 MG PO TABS: 5.0000 mg | ORAL_TABLET | Freq: Every day | ORAL | 3 refills | Status: DC

## 2018-04-02 NOTE — Progress Notes (Signed)
Subjective:    Patient ID: Naoma Diener, female    DOB: [birth year], 47 y.o.   MRN: [id number]  HPI  Ms. Kanaan is a 47 yr old female who presents today to establish care.  HTN- She is maintained on lisinopril 5mg  once daily.  BP Readings from Last 3 Encounters:  04/02/18 109/70  01/09/13 (!) 132/92  01/07/13 139/80   Reports that she is separting from her husband.    Hx of endometriosis.    Immunizations: reports last tetanus was about 4 years ago Diet: working on healthy diet Exercise: enjoys cardio/weights at least 3 times a week.  Pap Smear: 2018, sees Dr. Harrington Challenger, normal per patient Mammogram: 7/18- normal per patient Vision: up to date Dental: up to date  Review of Systems  Constitutional:       Has gained  20 pounds in the last 6 months  HENT: Negative for hearing loss and rhinorrhea.   Eyes: Negative for visual disturbance.  Respiratory: Negative for cough.   Cardiovascular:       Some ankle swelling when she is on her feet  Gastrointestinal: Negative for constipation and diarrhea.       Mild chronic constipation  Genitourinary: Negative for dysuria, frequency and hematuria.  Musculoskeletal: Negative for arthralgias and myalgias.  Skin: Negative for rash.  Neurological:       Some "stress related headaches"  Hematological: Negative for adenopathy.  Psychiatric/Behavioral:       Denies depression/anxiety   Past Medical History:  Diagnosis Date  . Hypertension   . Pelvic pain    Chronic, right lower quadrant     Social History   Socioeconomic History  . Marital status: Legally Separated    Spouse name: Not on file  . Number of children: Not on file  . Years of education: Not on file  . Highest education level: Not on file  Occupational History  . Occupation: Optician, dispensing: [employer]  Social Needs  . Financial resource strain: Not hard at all  . Food insecurity:    Worry: Never true    Inability: Never true  . Transportation needs:   Medical: Not on file    Non-medical: Not on file  Tobacco Use  . Smoking status: Never Smoker  . Smokeless tobacco: Never Used  Substance and Sexual Activity  . Alcohol use: No  . Drug use: No  . Sexual activity: Not Currently  Lifestyle  . Physical activity:    Days per week: 3 days    Minutes per session: 60 min  . Stress: Rather much  Relationships  . Social connections:    Talks on phone: More than three times a week    Gets together: Three times a week    Attends religious service: More than 4 times per year    Active member of club or organization: Yes    Attends meetings of clubs or organizations: More than 4 times per year    Relationship status: Separated  . Intimate partner violence:    Fear of current or ex partner: Not on file    Emotionally abused: Not on file    Physically abused: Not on file    Forced sexual activity: Not on file  Other Topics Concern  . 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  Past Surgical History:  Procedure Laterality Date  . ENDOMETRIAL ABLATION     Pelvic  . VAGINAL HYSTERECTOMY      Family History  Problem Relation Age of Onset  . Diabetes Mother   . Heart attack Mother 73  . Heart disease Mother   . Hypertension Father   . COPD Father   . Bipolar disorder Father   . Parkinson's disease Father   . Cervical cancer Sister   . Alcohol abuse Brother   . COPD Brother   . Hyperlipidemia Brother   . Hypertension Brother   . Stroke Brother   . COPD Maternal Grandmother   . Hyperlipidemia Maternal Grandmother   . Heart attack Maternal Grandmother   . Depression Paternal Grandmother   . Heart disease Paternal Grandmother   . Heart attack Paternal Grandmother     No Known Allergies  Current Outpatient Medications on File Prior to Visit  Medication Sig Dispense Refill  . Multiple  Vitamins-Minerals (MULTIVITAMIN PO) Take 1 tablet by mouth daily.      No current facility-administered medications on file prior to visit.     BP 109/70 (BP Location: Left Arm, Patient Position: Sitting, Cuff Size: Large)   Pulse 88   Temp 98.4 F (36.9 C) (Oral)   Resp 16   Ht 5\' 3"  (1.6 m)   Wt 194 lb 9.6 oz (88.3 kg)   SpO2 99%   BMI 34.47 kg/m       Objective:   Physical Exam Physical Exam  Constitutional: She is oriented to person, place, and time. She appears well-developed and well-nourished. No distress.  HENT:  Head: Normocephalic and atraumatic.  Right Ear: Tympanic membrane and ear canal normal.  Left Ear: Tympanic membrane and ear canal normal.  Mouth/Throat: Oropharynx is clear and moist.  Eyes: Pupils are equal, round, and reactive to light. No scleral icterus.  Neck: Normal range of motion. No thyromegaly present.  Cardiovascular: Normal rate and regular rhythm.   No murmur heard. Pulmonary/Chest: Effort normal and breath sounds normal. No respiratory distress. He has no wheezes. She has no rales. She exhibits no tenderness.  Abdominal: Soft. Bowel sounds are normal. She exhibits no distension and no mass. There is no tenderness. There is no rebound and no guarding.  Musculoskeletal: She exhibits no edema.  Lymphadenopathy:    She has no cervical adenopathy.  Neurological: She is alert and oriented to person, place, and time. She has normal patellar reflexes. She exhibits normal muscle tone. Coordination normal.  Skin: Skin is warm and dry.  Psychiatric: She has a normal mood and affect. Her behavior is normal. Judgment and thought content normal.  Breast/pelvic: deferred to [place]:    Preventive care- we discussed healthy diet, exercise, and weight loss.  Immunizations reviewed and up-to-date.  EKG is performed and personally reviewed.  EKG notes normal sinus rhythm.  She does have T wave inversions in the precordial leads.  EKG  appears slightly different from previous EKGs on file.  Will refer to cardiology for further evaluation.  Please see phone note.  Also of note patient was noted to have hyperlipidemia/severe hypertriglyceridemia.  She is being started on medication and will need 51-month follow-up fasting lipid panel.      Assessment & Plan:

## 2018-04-02 NOTE — Patient Instructions (Signed)
Please complete lab work prior to leaving. Continue your work on healthy diet, exercise and weight loss.  Send me your blood pressure readings via mychart and I will let you know if I think you can try coming off of lisinopril.

## 2018-04-03 ENCOUNTER — Telehealth: Payer: Self-pay | Admitting: Family

## 2018-04-03 DIAGNOSIS — R9431 Abnormal electrocardiogram [ECG] [EKG]: Secondary | ICD-10-CM

## 2018-04-03 MED ORDER — ATORVASTATIN CALCIUM 20 MG PO TABS: 20.0000 mg | ORAL_TABLET | Freq: Every day | ORAL | 1 refills | Status: DC

## 2018-04-03 MED ORDER — FISH OIL 1000 MG PO CAPS: 2.0000 | ORAL_CAPSULE | Freq: Two times a day (BID) | ORAL | 0 refills | Status: DC

## 2018-04-03 NOTE — Telephone Encounter (Signed)
Please contact pt and let her know that I reviewed her cholesterol- it is very high. Total cholesterol is 277 and triglycerides are 737 (<150 is normal).  I would recommend that she add atorvastatin 40mg  once daily and fish oil 2000mg  bid. Also work on low fat/low cholesterol diet, exercise, weight loss and avoiding concentrated sweets/white fluffy carbs.  She should avoid becoming pregnant on atorvastatin. Repeat FLP in 3 months.   Her EKG looks slightly different than her previous EKG's. Given her family history of heart disease and high cholesterol- I would like to get her back in with Dr. Acie Fredrickson for an appointment.  Referral placed.

## 2018-04-04 ENCOUNTER — Other Ambulatory Visit: Payer: Self-pay

## 2018-04-04 DIAGNOSIS — E7800 Pure hypercholesterolemia, unspecified: Secondary | ICD-10-CM

## 2018-04-04 MED ORDER — ATORVASTATIN CALCIUM 20 MG PO TABS: 20.0000 mg | ORAL_TABLET | Freq: Every day | ORAL | 1 refills | Status: DC

## 2018-04-04 NOTE — Telephone Encounter (Signed)
Advised patient of results and to work on low cholesterol, low carbs diet. She will pick up rx for atorvastatin and get fish oil. Order entered for lipid panel in 3 months.

## 2018-04-05 ENCOUNTER — Telehealth: Payer: Self-pay

## 2018-04-05 NOTE — Telephone Encounter (Signed)
Copied from [place] [id number] [id number]. Topic: Quick Communication - See Telephone Encounter >> Apr 05, 2018  2:20 PM Hewitt Shorts wrote: Pt is needing to discuss EKG changes -  Best number [phone]

## 2018-04-08 ENCOUNTER — Telehealth: Payer: Self-pay | Admitting: Family

## 2018-04-08 NOTE — Telephone Encounter (Signed)
Jane Montes, pt's cardiology referral is to Dr. Acie Fredrickson, she has not been contacted yet. She says it is ok to refer her to one of the MD's with cardiology upstairs please.

## 2018-04-09 ENCOUNTER — Ambulatory Visit: Payer: 59 | Admitting: Family

## 2018-04-10 ENCOUNTER — Ambulatory Visit: Payer: 59 | Admitting: Family

## 2018-06-21 MED FILL — ATORVASTATIN CALCIUM 20 MG: 20 | 90 days supply | Qty: 90 | Fill #0

## 2018-06-21 MED FILL — LISINOPRIL 5 MG TABLET: 5 | 30 days supply | Qty: 30 | Fill #0

## 2018-06-24 ENCOUNTER — Encounter: Payer: Self-pay | Admitting: Cardiovascular Disease

## 2018-06-24 ENCOUNTER — Ambulatory Visit (INDEPENDENT_AMBULATORY_CARE_PROVIDER_SITE_OTHER): Payer: 59 | Admitting: Cardiovascular Disease

## 2018-06-24 VITALS — BP 106/74 | HR 69 | Ht 62.0 in | Wt 195.0 lb

## 2018-06-24 DIAGNOSIS — E782 Mixed hyperlipidemia: Secondary | ICD-10-CM

## 2018-06-24 DIAGNOSIS — R079 Chest pain, unspecified: Secondary | ICD-10-CM

## 2018-06-24 MED ORDER — METOPROLOL TARTRATE 50 MG PO TABS: ORAL_TABLET | ORAL | 0 refills | Status: DC

## 2018-06-24 MED ORDER — FENOFIBRATE 145 MG PO TABS: 145.0000 mg | ORAL_TABLET | Freq: Every day | ORAL | 3 refills | Status: DC

## 2018-06-24 MED FILL — METOPROLOL TARTRATE 50 MG T: 50 | 1 days supply | Qty: 1 | Fill #0

## 2018-06-24 MED FILL — FENOFIBRATE 145 MG TAB: 145 | 90 days supply | Qty: 90 | Fill #0

## 2018-06-24 NOTE — Patient Instructions (Signed)
Medication Instructions:  Your physician has recommended you make the following change in your medication:   START Fenofibrate (Tricor) 145 mg once daily   Labwork: None Ordered   Testing/Procedures: Please arrive at the [facility] main entrance of [facility] at xx:xx AM (30-45 minutes prior to test start time)  [facility] [place], [place] [address] [phone]  Proceed to the [facility] Radiology Department (First Floor).  Please follow these instructions carefully (unless otherwise directed):  Hold all erectile dysfunction medications at least 48 hours prior to test.  On the Night Before the Test: . Drink plenty of water. . Do not consume any caffeinated/decaffeinated beverages or chocolate 12 hours prior to your test. . Do not take any antihistamines 12 hours prior to your test. . If you take Metformin do not take 24 hours prior to test. . If the patient has contrast allergy: ? Patient will need a prescription for Prednisone and very clear instructions (as follows): 1. Prednisone 50 mg - take 13 hours prior to test 2. Take another Prednisone 50 mg 7 hours prior to test 3. Take another Prednisone 50 mg 1 hour prior to test 4. Take Benadryl 50 mg 1 hour prior to test . Patient must complete all four doses of above prophylactic medications. . Patient will need a ride after test due to Benadryl.  On the Day of the Test: . Drink plenty of water. Do not drink any water within one hour of the test. . Do not eat any food 4 hours prior to the test. . You may take your regular medications prior to the test. . IF NOT ON A BETA BLOCKER - Take 50 mg of lopressor (metoprolol) one hour before the test. . HOLD Furosemide morning of the test.  After the Test: . Drink plenty of water. . After receiving IV contrast, you may experience a mild flushed feeling. This is normal. . On occasion, you may experience a mild rash up to 24 hours after the  test. This is not dangerous. If this occurs, you can take Benadryl 25 mg and increase your fluid intake. . If you experience trouble breathing, this can be serious. If it is severe call 911 IMMEDIATELY. If it is mild, please call our office. . If you take any of these medications: Glipizide/Metformin, Avandament, Glucavance, please do not take 48 hours after completing test.   Follow-Up: Your physician recommends that you schedule a follow-up appointment in: 3 months with Dr. Acie Fredrickson   If you need a refill on your cardiac medications before your next appointment, please call your pharmacy.   Thank you for choosing [facility]! Christen Bame, RN [phone]

## 2018-06-24 NOTE — Progress Notes (Signed)
Jane Montes Date of Birth  1971/03/19       [facility]    [employer] [address], [place], [place]  [address]   [place], [place]  [address] [phone]     [phone]   Fax  [phone]    Fax [phone]  Problem List: 1. Palpitations 2. Hyperlipidemia 3. Dizziness     Jane Montes is  a 47 year old female who presents today for followup of her palpitations and episodes of presyncope. She also is noted that her blood pressures been elevated. Is been under lots of stress recently. She's going through a separation. She's been exercising quite a bit and has been lifting a lot of weights. She is on a Paleo diet and has lost 28 pounds.  She's noticed lots of episodes of palpitations and lightheadedness. These primarily occur after she's been weight lifting. She lifts 280 pounds in a dead lift and this typically causes her to be dizzy and see "white spots".  She also has had some palpitations. These occurred at times are not necessarily related he any specific activity.  January 09, 2013:  She has continued to have dizziness and " white spots" in her vision.  She has been seen in the ER.   When she was on her way into the ER she had an  episode of CP / pressure.  Radiation to her right arm.  The pain was intermitant for hours.  Troponins were negtive, head CT was normal.  She has these episodes at work - she has hooked herself up to the monitor and has normal rhythm.  Her BP has also been normal during these episodes.    She broke her foot in January, 2014.  She had previously been doing lots of cardio exercise but has had to back off schedule.  She has continued to do her Cross-Fit workouts without chest pain.   We did a stress test years ago  Sept. 16, 2019:  Branae is seen back after a 5 year absence Has had some stress - going through a separatin and divorce.  Has heart burn. Has a small hiatal hernia   Has been stress  eating ,  Has gained 25 lbs.    Lifts weights, spin classes,  Kick boxing  3-5 days a week   No CP with exercise.  Has had some vague CP ( occurred after an argument )  Pain was mid sternal ,   Slightly to the left  Lasted 30-40 min No CP or pressured with her work outs .   Mother had her first MI at age 10.      Current Outpatient Medications on File Prior to Visit  Medication Sig Dispense Refill  . atorvastatin (LIPITOR) 20 MG tablet Take 1 tablet (20 mg total) by mouth daily. 90 tablet 1  . lisinopril (PRINIVIL,ZESTRIL) 5 MG tablet Take 1 tablet (5 mg total) by mouth daily. 30 tablet 3  . Multiple Vitamins-Minerals (MULTIVITAMIN PO) Take 1 tablet by mouth daily.     . Omega-3 Fatty Acids (FISH OIL) 1000 MG CAPS Take 2 capsules (2,000 mg total) by mouth 2 (two) times daily.  0   No current facility-administered medications on file prior to visit.     No Known Allergies  Past Medical History:  Diagnosis Date  . Hypertension   . Pelvic pain    Chronic, right lower quadrant    Past Surgical History:  Procedure  Laterality Date  . ENDOMETRIAL ABLATION     Pelvic  . VAGINAL HYSTERECTOMY      Social History   Tobacco Use  Smoking Status Never Smoker  Smokeless Tobacco Never Used    Social History   Substance and Sexual Activity  Alcohol Use No    Family History  Problem Relation Age of Onset  . Diabetes Mother   . Heart attack Mother 96  . Heart disease Mother   . Hypertension Father   . COPD Father   . Bipolar disorder Father   . Parkinson's disease Father   . Cervical cancer Sister   . Alcohol abuse Brother   . COPD Brother   . Hyperlipidemia Brother   . Hypertension Brother   . Stroke Brother   . COPD Maternal Grandmother   . Hyperlipidemia Maternal Grandmother   . Heart attack Maternal Grandmother   . Depression Paternal Grandmother   . Heart disease Paternal Grandmother   . Heart attack Paternal Grandmother     Reviw of Systems:  Reviewed in  the HPI.  All other systems are negative.  Physical Exam: Blood pressure 106/74, pulse 69, height 5\' 2"  (1.575 m), weight 195 lb (88.5 kg), SpO2 98 %.  GEN:  Young female,   HEENT: Normal NECK: No JVD; No carotid bruits LYMPHATICS: No lymphadenopathy CARDIAC:  RR  RESPIRATORY:  Clear to auscultation without rales, wheezing or rhonchi  ABDOMEN: Soft, non-tender, non-distended MUSCULOSKELETAL:  No edema; No deformity  SKIN: Warm and dry NEUROLOGIC:  Alert and oriented x 3   ECG:    Assessment / Plan:   1.  Chest discomfort:  Beatriz  presents today having had 2 episodes of chest discomfort.  Each 1 of these occurred when she was in an argument.  She thinks that some of this was stressed.  Her mother had her first myocardial infarction at age 49.  Afrah has had some high cholesterol and triglyceride levels.  I am concerned that she may have developed some plaque and may have had a small plaque rupture.  We will get a coronary CT angiogram for further evaluation of this episode of chest discomfort.   2.  Hyperlipidemia: Her last triglyceride level is 737.  She is gained about 25 pounds over the past year.  She is been stress eating because of this related to her separation and divorce.  I have encouraged her to get back on a good diet.  I encouraged her to see the folks at [facility] weight and wellness.     Mertie Moores, MD  06/24/2018 3:30 PM    [facility],  [place], [place]  [address] Pager [phone] Phone: [phone]; Fax: [phone]

## 2018-07-23 ENCOUNTER — Encounter: Payer: 59 | Admitting: *Deleted

## 2018-07-23 ENCOUNTER — Ambulatory Visit (HOSPITAL_COMMUNITY)
Admission: RE | Admit: 2018-07-23 | Discharge: 2018-07-23 | Disposition: A | Discharge status: Home / Self Care | Payer: 59 | Source: Ambulatory Visit | Attending: Cardiovascular Disease | Admitting: Cardiovascular Disease

## 2018-07-23 ENCOUNTER — Encounter (HOSPITAL_COMMUNITY): Payer: Self-pay

## 2018-07-23 DIAGNOSIS — E782 Mixed hyperlipidemia: Secondary | ICD-10-CM | POA: Insufficient documentation

## 2018-07-23 DIAGNOSIS — R079 Chest pain, unspecified: Secondary | ICD-10-CM | POA: Insufficient documentation

## 2018-07-23 DIAGNOSIS — Z006 Encounter for examination for normal comparison and control in clinical research program: Secondary | ICD-10-CM

## 2018-07-23 MED ORDER — METOPROLOL TARTRATE 5 MG/5ML IV SOLN
INTRAVENOUS | Status: AC
  Filled 2018-07-23: qty 10

## 2018-07-23 MED ORDER — IOPAMIDOL (ISOVUE-370) INJECTION 76%
100.0000 mL | Freq: Once | INTRAVENOUS | Status: AC | PRN
  Administered 2018-07-23: 90 mL via INTRAVENOUS

## 2018-07-23 MED ORDER — METOPROLOL TARTRATE 5 MG/5ML IV SOLN
5.0000 mg | INTRAVENOUS | Status: DC | PRN
  Administered 2018-07-23: 5 mg via INTRAVENOUS
  Filled 2018-07-23: qty 5

## 2018-07-23 MED ORDER — NITROGLYCERIN 0.4 MG SL SUBL
0.8000 mg | SUBLINGUAL_TABLET | Freq: Once | SUBLINGUAL | Status: AC
  Administered 2018-07-23: 0.8 mg via SUBLINGUAL
  Filled 2018-07-23: qty 25

## 2018-07-23 MED ORDER — NITROGLYCERIN 0.4 MG SL SUBL
SUBLINGUAL_TABLET | SUBLINGUAL | Status: AC
  Filled 2018-07-23: qty 1

## 2018-07-23 NOTE — Research (Signed)
CADFEM Informed Consent           Subject Name:  Jane Montes   Subject met inclusion and exclusion criteria.  The informed consent form, study requirements and expectations were reviewed with the subject and questions and concerns were addressed prior to the signing of the consent form.  The subject verbalized understanding of the trial requirements.  The subject agreed to participate in the CADFEM trial and signed the informed consent.  The informed consent was obtained prior to performance of any protocol-specific procedures for the subject.  A copy of the signed informed consent was given to the subject and a copy was placed in the subject's medical record.   [place] Taegan Standage, Research Assistant 07/23/2018   10:36 a.m.

## 2018-09-19 MED FILL — FENOFIBRATE 145 MG TABLET: 145 | 90 days supply | Qty: 90 | Fill #1

## 2018-09-19 MED FILL — ATORVASTATIN CALCIUM 20 MG: 20 | 30 days supply | Qty: 30 | Fill #1

## 2018-09-23 ENCOUNTER — Ambulatory Visit: Payer: 59 | Admitting: Cardiovascular Disease

## 2018-10-04 ENCOUNTER — Ambulatory Visit: Payer: 59 | Admitting: Family

## 2018-10-07 ENCOUNTER — Ambulatory Visit (INDEPENDENT_AMBULATORY_CARE_PROVIDER_SITE_OTHER): Payer: 59 | Admitting: Family

## 2018-10-07 ENCOUNTER — Encounter: Payer: Self-pay | Admitting: Family

## 2018-10-07 VITALS — BP 141/81 | HR 82 | Temp 98.5°F | Resp 16 | Ht 62.0 in | Wt 199.0 lb

## 2018-10-07 DIAGNOSIS — I10 Essential (primary) hypertension: Secondary | ICD-10-CM | POA: Diagnosis not present

## 2018-10-07 DIAGNOSIS — R1011 Right upper quadrant pain: Secondary | ICD-10-CM | POA: Diagnosis not present

## 2018-10-07 DIAGNOSIS — E785 Hyperlipidemia, unspecified: Secondary | ICD-10-CM

## 2018-10-07 MED ORDER — ATORVASTATIN CALCIUM 20 MG PO TABS: 20.0000 mg | ORAL_TABLET | Freq: Every day | ORAL | 1 refills | Status: DC

## 2018-10-07 NOTE — Patient Instructions (Signed)
Please schedule a lab visit prior to leaving.

## 2018-10-07 NOTE — Progress Notes (Signed)
Subjective:    Patient ID: Jane Montes, female    DOB: 11/07/1970, 47 y.o.   MRN: [id number]  HPI  Patient is a 47 yr old female who presents today for follow up.  HTN- maintained on lopressor.  BP Readings from Last 3 Encounters:  10/07/18 (!) 141/81  07/23/18 127/82  07/23/18 121/74   Reports right sided shoulder pain after eating.  Reports that she sometimes has pain up under the ribs with bending.   Wt Readings from Last 3 Encounters:  10/07/18 199 lb (90.3 kg)  06/24/18 195 lb (88.5 kg)  04/02/18 194 lb 9.6 oz (88.3 kg)    Lab Results  Component Value Date   CHOL 277 (H) 04/02/2018   HDL 41.20 04/02/2018   LDLCALC 125 (H) 01/09/2013   LDLDIRECT 105.0 04/02/2018   TRIG (H) 04/02/2018    737.0 Triglyceride is over 400; calculations on Lipids are invalid.   CHOLHDL 7 04/02/2018    Review of Systems See HPI  Past Medical History:  Diagnosis Date  . Hypertension   . Pelvic pain    Chronic, right lower quadrant     Social History   Socioeconomic History  . Marital status: Legally Separated    Spouse name: Not on file  . Number of children: Not on file  . Years of education: Not on file  . Highest education level: Not on file  Occupational History  . Occupation: Optician, dispensing: [employer]  Social Needs  . Financial resource strain: Not hard at all  . Food insecurity:    Worry: Never true    Inability: Never true  . Transportation needs:    Medical: Not on file    Non-medical: Not on file  Tobacco Use  . Smoking status: Never Smoker  . Smokeless tobacco: Never Used  Substance and Sexual Activity  . Alcohol use: No  . Drug use: No  . Sexual activity: Not Currently  Lifestyle  . Physical activity:    Days per week: 3 days    Minutes per session: 60 min  . Stress: Rather much  Relationships  . Social connections:    Talks on phone: More than three times a week    Gets together: Three times a week    Attends religious service: More than 4  times per year    Active member of club or organization: Yes    Attends meetings of clubs or organizations: More than 4 times per year    Relationship status: Separated  . Intimate partner violence:    Fear of current or ex partner: Not on file    Emotionally abused: Not on file    Physically abused: Not on file    Forced sexual activity: Not on file  Other Topics Concern  . 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      Past Surgical History:  Procedure Laterality Date  . ENDOMETRIAL ABLATION     Pelvic  . VAGINAL HYSTERECTOMY      Family History  Problem Relation Age of Onset  . Diabetes Mother   . Heart attack Mother 77  . Heart disease Mother   . Hypertension Father   . COPD Father   . Bipolar disorder Father   . Parkinson's disease Father   . Cervical cancer Sister   . Alcohol  abuse Brother   . COPD Brother   . Hyperlipidemia Brother   . Hypertension Brother   . Stroke Brother   . COPD Maternal Grandmother   . Hyperlipidemia Maternal Grandmother   . Heart attack Maternal Grandmother   . Depression Paternal Grandmother   . Heart disease Paternal Grandmother   . Heart attack Paternal Grandmother     No Known Allergies  Current Outpatient Medications on File Prior to Visit  Medication Sig Dispense Refill  . atorvastatin (LIPITOR) 20 MG tablet Take 1 tablet (20 mg total) by mouth daily. 90 tablet 1  . fenofibrate (TRICOR) 145 MG tablet Take 1 tablet (145 mg total) by mouth daily. 90 tablet 3  . Multiple Vitamins-Minerals (MULTIVITAMIN PO) Take 1 tablet by mouth daily.     . Omega-3 Fatty Acids (FISH OIL) 1000 MG CAPS Take 2 capsules (2,000 mg total) by mouth 2 (two) times daily.  0  . metoprolol tartrate (LOPRESSOR) 50 MG tablet Take 1 hour before your coronary CT (Patient not taking: Reported on 10/07/2018) 1 tablet 0   No current  facility-administered medications on file prior to visit.     BP (!) 141/81 (BP Location: Left Arm, Patient Position: Sitting, Cuff Size: Small)   Pulse 82   Temp 98.5 F (36.9 C) (Oral)   Resp 16   Ht 5\' 2"  (1.575 m)   Wt 199 lb (90.3 kg)   SpO2 100%   BMI 36.40 kg/m       Objective:   Physical Exam Constitutional:      Appearance: She is well-developed.  Neck:     Musculoskeletal: Neck supple.     Thyroid: No thyromegaly.  Cardiovascular:     Rate and Rhythm: Normal rate and regular rhythm.     Heart sounds: Normal heart sounds. No murmur.  Pulmonary:     Effort: Pulmonary effort is normal. No respiratory distress.     Breath sounds: Normal breath sounds. No wheezing.  Abdominal:     General: Bowel sounds are normal. There is no distension.     Palpations: Abdomen is soft.     Tenderness: There is no abdominal tenderness.  Skin:    General: Skin is warm and dry.  Neurological:     Mental Status: She is alert and oriented to person, place, and time.  Psychiatric:        Behavior: Behavior normal.        Thought Content: Thought content normal.        Judgment: Judgment normal.           Assessment & Plan:  RUQ pain- obtain abd [place] to assess gallbladder.  Hyperipidemia- obtain follow up lipid panel, continue statin.   HTN- fair bp on beta blocker. Continue same.

## 2018-10-08 ENCOUNTER — Other Ambulatory Visit (INDEPENDENT_AMBULATORY_CARE_PROVIDER_SITE_OTHER): Payer: 59

## 2018-10-08 ENCOUNTER — Telehealth: Payer: Self-pay | Admitting: Family

## 2018-10-08 DIAGNOSIS — E785 Hyperlipidemia, unspecified: Secondary | ICD-10-CM | POA: Diagnosis not present

## 2018-10-08 DIAGNOSIS — N2889 Other specified disorders of kidney and ureter: Secondary | ICD-10-CM

## 2018-10-08 LAB — LIPID PANEL
Cholesterol: 141 mg/dL (ref 0–200)
HDL: 45.4 mg/dL (ref >39.00)
LDL Cholesterol: 66 mg/dL (ref 0–99)
NonHDL: 95.53
Total CHOL/HDL Ratio: 3
Triglycerides: 146 mg/dL (ref 0.0–149.0)
VLDL: 29.2 mg/dL (ref 0.0–40.0)

## 2018-10-08 NOTE — Telephone Encounter (Signed)
Caller name: Relation to pt: Call back [phone] Pharmacy:  Reason for call: pt would like for you to call her regarding her appt yesterday concerning GI issues.

## 2018-10-10 ENCOUNTER — Other Ambulatory Visit: Payer: Self-pay

## 2018-10-10 ENCOUNTER — Other Ambulatory Visit (INDEPENDENT_AMBULATORY_CARE_PROVIDER_SITE_OTHER): Payer: 59

## 2018-10-10 ENCOUNTER — Telehealth: Payer: Self-pay | Admitting: Family

## 2018-10-10 ENCOUNTER — Ambulatory Visit (HOSPITAL_BASED_OUTPATIENT_CLINIC_OR_DEPARTMENT_OTHER)
Admission: RE | Admit: 2018-10-10 | Discharge: 2018-10-10 | Disposition: A | Discharge status: Home / Self Care | Payer: 59 | Source: Ambulatory Visit | Attending: Family | Admitting: Family

## 2018-10-10 DIAGNOSIS — I10 Essential (primary) hypertension: Secondary | ICD-10-CM

## 2018-10-10 DIAGNOSIS — K760 Fatty (change of) liver, not elsewhere classified: Secondary | ICD-10-CM | POA: Diagnosis not present

## 2018-10-10 DIAGNOSIS — N200 Calculus of kidney: Secondary | ICD-10-CM | POA: Diagnosis not present

## 2018-10-10 DIAGNOSIS — I700 Atherosclerosis of aorta: Secondary | ICD-10-CM

## 2018-10-10 DIAGNOSIS — R1011 Right upper quadrant pain: Secondary | ICD-10-CM | POA: Insufficient documentation

## 2018-10-10 HISTORY — DX: Atherosclerosis of aorta: I70.0

## 2018-10-10 LAB — BASIC METABOLIC PANEL
BUN: 21 mg/dL (ref 6–23)
CO2: 24 mEq/L (ref 19–32)
Calcium: 9.2 mg/dL (ref 8.4–10.5)
Chloride: 104 mEq/L (ref 96–112)
Creatinine, Ser: 1.16 mg/dL (ref 0.40–1.20)
GFR: 53.12 mL/min — ABNORMAL LOW (ref >60.00)
Glucose, Bld: 97 mg/dL (ref 70–99)
POTASSIUM: 3.6 meq/L (ref 3.5–5.1)
Sodium: 137 mEq/L (ref 135–145)

## 2018-10-10 NOTE — Telephone Encounter (Signed)
I spoke to patient and reviewed results including abnormal findings on abdominal US and need to proceed with MRI. Pt verbalizes understanding and agrees to proceed with MRI.

## 2018-10-10 NOTE — Telephone Encounter (Signed)
Attempted to reach patient at number listed.  No answer.  Left message.   Could you please contact the patient again later  and let her know that her cholesterol is significantly improved and both cholesterol and triglycerides are now in the normal range.  Also could you ask her what specific questions or concerns she has?

## 2018-10-10 NOTE — Telephone Encounter (Signed)
Patient will be here at 1:15 for bmet.

## 2018-10-10 NOTE — Telephone Encounter (Signed)
Please contact pt to schedule a bmet prior to MRI.

## 2018-10-12 ENCOUNTER — Ambulatory Visit (HOSPITAL_BASED_OUTPATIENT_CLINIC_OR_DEPARTMENT_OTHER)
Admission: RE | Admit: 2018-10-12 | Discharge: 2018-10-12 | Disposition: A | Discharge status: Home / Self Care | Payer: 59 | Source: Ambulatory Visit | Attending: Family | Admitting: Family

## 2018-10-12 DIAGNOSIS — N132 Hydronephrosis with renal and ureteral calculous obstruction: Secondary | ICD-10-CM | POA: Diagnosis not present

## 2018-10-12 DIAGNOSIS — N1330 Unspecified hydronephrosis: Secondary | ICD-10-CM

## 2018-10-12 DIAGNOSIS — N2889 Other specified disorders of kidney and ureter: Secondary | ICD-10-CM | POA: Insufficient documentation

## 2018-10-12 DIAGNOSIS — N200 Calculus of kidney: Secondary | ICD-10-CM

## 2018-10-12 DIAGNOSIS — D3501 Benign neoplasm of right adrenal gland: Secondary | ICD-10-CM

## 2018-10-12 DIAGNOSIS — K760 Fatty (change of) liver, not elsewhere classified: Secondary | ICD-10-CM

## 2018-10-12 HISTORY — DX: Benign neoplasm of right adrenal gland: D35.01

## 2018-10-12 HISTORY — DX: Fatty (change of) liver, not elsewhere classified: K76.0

## 2018-10-12 HISTORY — DX: Other specified disorders of kidney and ureter: N28.89

## 2018-10-12 HISTORY — DX: Calculus of kidney: N20.0

## 2018-10-12 HISTORY — DX: Unspecified hydronephrosis: N13.30

## 2018-10-12 MED ORDER — GADOBUTROL 1 MMOL/ML IV SOLN
10.0000 mL | Freq: Once | INTRAVENOUS | Status: AC | PRN
  Administered 2018-10-12: 10 mL via INTRAVENOUS

## 2018-10-13 ENCOUNTER — Encounter: Payer: Self-pay | Admitting: Family

## 2018-10-13 DIAGNOSIS — N1330 Unspecified hydronephrosis: Secondary | ICD-10-CM

## 2018-10-14 ENCOUNTER — Encounter: Payer: Self-pay | Admitting: Family

## 2018-10-14 MED FILL — ATORVASTATIN CALCIUM 20 MG: 20 | 90 days supply | Qty: 90 | Fill #0

## 2018-10-14 NOTE — Telephone Encounter (Signed)
Patient contacted via phone. Question answered.  She is agreeable to see [facility].

## 2018-11-13 DIAGNOSIS — N1339 Other hydronephrosis: Secondary | ICD-10-CM | POA: Diagnosis not present

## 2018-11-13 DIAGNOSIS — D3501 Benign neoplasm of right adrenal gland: Secondary | ICD-10-CM | POA: Diagnosis not present

## 2018-11-13 DIAGNOSIS — R1084 Generalized abdominal pain: Secondary | ICD-10-CM | POA: Diagnosis not present

## 2018-11-13 DIAGNOSIS — N132 Hydronephrosis with renal and ureteral calculous obstruction: Secondary | ICD-10-CM | POA: Diagnosis not present

## 2018-11-13 DIAGNOSIS — R3121 Asymptomatic microscopic hematuria: Secondary | ICD-10-CM | POA: Diagnosis not present

## 2018-11-14 ENCOUNTER — Other Ambulatory Visit: Payer: Self-pay

## 2018-11-14 ENCOUNTER — Encounter (HOSPITAL_BASED_OUTPATIENT_CLINIC_OR_DEPARTMENT_OTHER): Payer: Self-pay

## 2018-11-14 ENCOUNTER — Other Ambulatory Visit: Payer: Self-pay | Admitting: Urology

## 2018-11-14 NOTE — Anesthesia Preprocedure Evaluation (Addendum)
Anesthesia Evaluation  Patient identified by MRN, date of birth, ID band Patient awake    Reviewed: Allergy & Precautions, NPO status , Patient's Chart, lab work & pertinent test results  Airway Mallampati: II  TM Distance: >3 FB Neck ROM: Full    Dental no notable dental hx. (+) Teeth Intact, Dental Advisory Given   Pulmonary neg pulmonary ROS,    Pulmonary exam normal breath sounds clear to auscultation       Cardiovascular Exercise Tolerance: Good hypertension, negative cardio ROS Normal cardiovascular exam Rhythm:Regular Rate:Normal  Bp been Normal for 3 month and on no meds   Neuro/Psych negative neurological ROS  negative psych ROS   GI/Hepatic GERD  ,  Endo/Other    Renal/GU Renal InsufficiencyRenal disease     Musculoskeletal   Abdominal (+) + obese,   Peds  Hematology   Anesthesia Other Findings   Reproductive/Obstetrics negative OB ROS                            Anesthesia Physical Anesthesia Plan  ASA: II  Anesthesia Plan: General   Post-op Pain Management:    Induction: Intravenous  PONV Risk Score and Plan: 4 or greater and Treatment may vary due to age or medical condition, Ondansetron, Dexamethasone and Midazolam  Airway Management Planned: LMA  Additional Equipment:   Intra-op Plan:   Post-operative Plan:   Informed Consent: I have reviewed the patients History and Physical, chart, labs and discussed the procedure including the risks, benefits and alternatives for the proposed anesthesia with the patient or authorized representative who has indicated his/her understanding and acceptance.     Dental advisory given  Plan Discussed with: CRNA  Anesthesia Plan Comments: (Ureteroscopy w holmium laser Lithotripsy and Stent)        Anesthesia Quick Evaluation

## 2018-11-14 NOTE — H&P (Signed)
Office Visit Report     11/13/2018   --------------------------------------------------------------------------------   Jane Montes. Brandstetter  MRN: [id number]  PRIMARY CARE:  Jaymes Graff. Maceo Pro, MD  DOB: 1970/10/19, 48 year old Female  REFERRING:  Earlie Counts  SSN: -[id number]  PROVIDER:  Festus Aloe, M.D.    LOCATION:  [facility] [phone]   --------------------------------------------------------------------------------   CC: I have hydronephrosis.  HPI: Jane Montes is a 48 year-old female patient who was referred by Earlie Counts who is here for hydronephrosis.  Her problem was diagnosed 10/12/2018. The problem is on the right side. She had the following x-rays done: MRI Scan.   She is currently having flank pain. She denies having back pain, groin pain, nausea, vomiting, fever, and chills.   The patient underwent US October 10, 2018 for abdominal pain. This revealed a right adrenal mass. An MRI of the abdomen October 12, 2018 which revealed right hydronephrosis. No obvious stone or filling defect. Over past 2 weeks she's had right flank pain. She is a cardiac nurse. She drinks a lot of water. She has urgency to void which is new.   She does have a history of stones within a 10 mm stone was noted in the right lower pole on a KUB done in conjunction with an upper GI study. The stone was also seen on ultrasound.   CT today with a 4 mm right UPJ stone and a 10 mm RLP stone - unofficial.     CC: I have an adrenal mass.  HPI: Her problem was diagnosed 10/10/2018. The problem is on the right side. She had the following x-rays done: Renal Ultrasound and MRI Scan. She has an adrenal adenoma.   The patient underwent abdominal ultrasound October 10, 2018 for abdominal pain. This revealed a 3 cm right adrenal mass. Follow-up MRI October 12, 2018 noted the right adrenal mass to be consistent with a benign adenoma and 2.9 cm.   She has a h/o HTN but was on one BP medicine but now  on no BP meds.     ALLERGIES: No Allergies    MEDICATIONS: Atorvastatin Calcium 20 mg tablet  Fenofibrate 145 mg tablet  Fish Oil  Multiple Vitamin     GU PSH: Hysterectomy Unilat SO - 2016      PSH Notes: Hysterectomy   NON-GU PSH: None   GU PMH: Renal calculus, Nephrolithiasis - 2016    NON-GU PMH: Encounter for general adult medical examination without abnormal findings, Encounter for preventive health examination - 2016 Hypercholesterolemia Hypertension    FAMILY HISTORY: Coronary Artery Disease - Runs In Family Death of family member - Father Diabetes - Mother Heart Disease - Mother Kidney Stones - Father    Notes: 2 daughters   SOCIAL HISTORY: Marital Status: Single Preferred Language: English; Ethnicity: Not Hispanic Or Latino; Race: White Current Smoking Status: Patient has never smoked.   Tobacco Use Assessment Completed: Used Tobacco in last 30 days? Does not use smokeless tobacco. Drinks 1 drink per week. Social Drinker.  Drinks 2 caffeinated drinks per day. 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     Notes: Married, Occupation, Never a smoker, Number of children, Alcohol use   REVIEW OF SYSTEMS:    GU Review Female:   Patient reports frequent urination, get up at night to urinate, and leakage of urine. Patient denies hard to postpone urination, burning /pain with urination, stream starts and stops, trouble starting your stream, have to strain to urinate, and being  pregnant.  Gastrointestinal (Upper):   Patient reports nausea and indigestion/ heartburn. Patient denies vomiting.  Gastrointestinal (Lower):   Patient denies diarrhea and constipation.  Constitutional:   Patient reports fatigue. Patient denies fever, night sweats, and weight loss.  Skin:   Patient denies skin rash/ lesion and itching.  Eyes:   Patient denies blurred vision and double vision.  Ears/ Nose/ Throat:   Patient denies sore throat and sinus problems.   Hematologic/Lymphatic:   Patient denies swollen glands and easy bruising.  Cardiovascular:   Patient denies leg swelling and chest pains.  Respiratory:   Patient denies cough and shortness of breath.  Endocrine:   Patient denies excessive thirst.  Musculoskeletal:   Patient denies back pain and joint pain.  Neurological:   Patient denies dizziness and headaches.  Psychologic:   Patient denies depression and anxiety.   VITAL SIGNS:      11/13/2018 01:20 PM  Weight 194.3 lb / 88.13 kg  Height 62 in / 157.48 cm  BP 146/97 mmHg  Pulse 94 /min  Temperature 98.6 F / 37 C  BMI 35.5 kg/m   MULTI-SYSTEM PHYSICAL EXAMINATION:    Constitutional: Well-nourished. No physical deformities. Normally developed. Good grooming.  Neck: Neck symmetrical, not swollen. Normal tracheal position.  Respiratory: No labored breathing, no use of accessory muscles.   Cardiovascular: Normal temperature, normal extremity pulses, no swelling, no varicosities.  Skin: No paleness, no jaundice, no cyanosis. No lesion, no ulcer, no rash.  Neurologic / Psychiatric: Oriented to time, oriented to place, oriented to person. No depression, no anxiety, no agitation.  Gastrointestinal: No mass, no tenderness, no rigidity, non obese abdomen.  Eyes: Normal conjunctivae. Normal eyelids.  Ears, Nose, Mouth, and Throat: Left ear no scars, no lesions, no masses. Right ear no scars, no lesions, no masses. Nose no scars, no lesions, no masses. Normal hearing. Normal lips.  Musculoskeletal: Normal gait and station of head and neck.     PAST DATA REVIEWED:  Source Of History:  Patient  X-Ray Review: KUB: Reviewed Films. 2016 Renal Ultrasound: Reviewed Films. abd - 2020, 2016  MRI Abdomen: Reviewed Films. 10/2018    PROCEDURES:         C.T. Urogram - [id number]               Urinalysis w/Scope Dipstick Dipstick Cont'd Micro  Color: Yellow Bilirubin: Neg mg/dL WBC/hpf: NS (Not Seen)  Appearance: Clear Ketones: Neg mg/dL  RBC/hpf: 10 - 20/hpf  Specific Gravity: 1.020 Blood: 3+ ery/uL Bacteria: Rare (0-9/hpf)  pH: 6.5 Protein: Trace mg/dL Cystals: NS (Not Seen)  Glucose: Neg mg/dL Urobilinogen: 0.2 mg/dL Casts: NS (Not Seen)    Nitrites: Neg Trichomonas: Not Present    Leukocyte Esterase: Trace leu/uL Mucous: Not Present      Epithelial Cells: 0 - 5/hpf      Yeast: NS (Not Seen)      Sperm: Not Present    ASSESSMENT:      ICD-10 Details  1 GU:   Oth hydronephrosis - N13.39   2   Flank Pain - R10.84   3   Microscopic hematuria - R31.21   4   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 Stone Protocol Without Contrast  X-Ray Notes: History:  Hematuria: Yes/No  Patient to see MD after exam: Yes/No  Previous exam: CT / IVP/ US/ KUB/ None  When:  Where:  Diabetic: Yes/ No  BUN/ Creatinine:  Date of last BUN Creatinine:  Weight in pounds:  Allergy- IV Contrast: Yes/ No  Conflicting diabetic meds: Yes/ No  Diabetic Meds:  Prior Authorization #: No auth needed per Elta [place] W.w/[facility], call ref#[id number]           Schedule Return Visit/Planned Activity: Next Available Appointment - Schedule Surgery          Document Letter(s):  Created for Patient: Clinical Summary         Notes:   right UPJ stone and RLP stone - I discussed with the patient the nature risks and benefits of continued stone passage, off label use of alpha blockers, shockwave lithotripsy or ureteroscopy. Discussed expected need for staged procedures and complications. All questions answered. She will proceed with URS given the two stones and discussed she may need staged or combo with ESWL if access is tough. I drew her a picture of the anatomy.   adrenal adena - will follow   cc: Dr. Maceo Pro      * Signed by Festus Aloe, M.D. on 11/14/18 at 8:55 AM (EST)*     The information contained in this medical record document is considered private and  confidential patient information. This information can only be used for the medical diagnosis and/or medical services that are being provided by the patient's selected caregivers. This information can only be distributed outside of the patient's care if the patient agrees and signs waivers of authorization for this information to be sent to an outside source or route.

## 2018-11-14 NOTE — Progress Notes (Signed)
Spoke with: Shaniyah NPO:  After Midnight, no gum, candy, or mints   Arrival time: 0930AM Labs: Istat 4 (ekg 06/24/2018 chart/epic) AM medications:  None Pre op orders: no Ride home:  Aldona Bar (daughter) [phone] or Darnell Level (husband) [phone]

## 2018-11-15 ENCOUNTER — Encounter (HOSPITAL_BASED_OUTPATIENT_CLINIC_OR_DEPARTMENT_OTHER)
Admission: RE | Disposition: A | Discharge status: Home / Self Care | Payer: Self-pay | Source: Home / Self Care | Attending: Urology

## 2018-11-15 ENCOUNTER — Ambulatory Visit (HOSPITAL_BASED_OUTPATIENT_CLINIC_OR_DEPARTMENT_OTHER): Payer: 59 | Admitting: Anesthesiology

## 2018-11-15 ENCOUNTER — Ambulatory Visit (HOSPITAL_BASED_OUTPATIENT_CLINIC_OR_DEPARTMENT_OTHER)
Admission: RE | Admit: 2018-11-15 | Discharge: 2018-11-15 | Disposition: A | Discharge status: Home / Self Care | Payer: 59 | Attending: Urology | Admitting: Urology

## 2018-11-15 ENCOUNTER — Encounter (HOSPITAL_BASED_OUTPATIENT_CLINIC_OR_DEPARTMENT_OTHER): Payer: Self-pay | Admitting: Anesthesiology

## 2018-11-15 DIAGNOSIS — E279 Disorder of adrenal gland, unspecified: Secondary | ICD-10-CM | POA: Diagnosis not present

## 2018-11-15 DIAGNOSIS — Z79899 Other long term (current) drug therapy: Secondary | ICD-10-CM | POA: Insufficient documentation

## 2018-11-15 DIAGNOSIS — K219 Gastro-esophageal reflux disease without esophagitis: Secondary | ICD-10-CM | POA: Diagnosis not present

## 2018-11-15 DIAGNOSIS — N200 Calculus of kidney: Secondary | ICD-10-CM

## 2018-11-15 DIAGNOSIS — E669 Obesity, unspecified: Secondary | ICD-10-CM | POA: Diagnosis not present

## 2018-11-15 DIAGNOSIS — N132 Hydronephrosis with renal and ureteral calculous obstruction: Secondary | ICD-10-CM | POA: Insufficient documentation

## 2018-11-15 DIAGNOSIS — Z6836 Body mass index (BMI) 36.0-36.9, adult: Secondary | ICD-10-CM | POA: Insufficient documentation

## 2018-11-15 DIAGNOSIS — Z8249 Family history of ischemic heart disease and other diseases of the circulatory system: Secondary | ICD-10-CM | POA: Diagnosis not present

## 2018-11-15 DIAGNOSIS — Z841 Family history of disorders of kidney and ureter: Secondary | ICD-10-CM | POA: Diagnosis not present

## 2018-11-15 DIAGNOSIS — Z87442 Personal history of urinary calculi: Secondary | ICD-10-CM | POA: Diagnosis not present

## 2018-11-15 DIAGNOSIS — Z90710 Acquired absence of both cervix and uterus: Secondary | ICD-10-CM | POA: Diagnosis not present

## 2018-11-15 DIAGNOSIS — I10 Essential (primary) hypertension: Secondary | ICD-10-CM | POA: Insufficient documentation

## 2018-11-15 DIAGNOSIS — N201 Calculus of ureter: Secondary | ICD-10-CM | POA: Diagnosis not present

## 2018-11-15 DIAGNOSIS — Z833 Family history of diabetes mellitus: Secondary | ICD-10-CM | POA: Insufficient documentation

## 2018-11-15 HISTORY — DX: Gastro-esophageal reflux disease without esophagitis: K21.9

## 2018-11-15 HISTORY — PX: URETEROSCOPY WITH HOLMIUM LASER LITHOTRIPSY: SHX6645

## 2018-11-15 HISTORY — DX: Presence of spectacles and contact lenses: Z97.3

## 2018-11-15 HISTORY — DX: Fatty (change of) liver, not elsewhere classified: K76.0

## 2018-11-15 HISTORY — DX: Benign neoplasm of right adrenal gland: D35.01

## 2018-11-15 HISTORY — DX: Left anterior fascicular block: I44.4

## 2018-11-15 HISTORY — PX: HOLMIUM LASER APPLICATION: SHX5852

## 2018-11-15 HISTORY — DX: Unspecified hydronephrosis: N13.30

## 2018-11-15 HISTORY — DX: Hyperlipidemia, unspecified: E78.5

## 2018-11-15 HISTORY — DX: Personal history of other specified conditions: Z87.898

## 2018-11-15 HISTORY — DX: Other specified disorders of kidney and ureter: N28.89

## 2018-11-15 HISTORY — DX: Calculus of kidney: N20.0

## 2018-11-15 HISTORY — DX: Personal history of other diseases of the digestive system: Z87.19

## 2018-11-15 HISTORY — DX: Palpitations: R00.2

## 2018-11-15 HISTORY — DX: Atherosclerosis of aorta: I70.0

## 2018-11-15 HISTORY — DX: Personal history of other diseases of the nervous system and sense organs: Z86.69

## 2018-11-15 LAB — POCT I-STAT 4, (NA,K, GLUC, HGB,HCT)
Glucose, Bld: 75 mg/dL (ref 70–99)
HEMATOCRIT: 36 % (ref 36.0–46.0)
HEMOGLOBIN: 12.2 g/dL (ref 12.0–15.0)
Potassium: 4.9 mmol/L (ref 3.5–5.1)
Sodium: 139 mmol/L (ref 135–145)

## 2018-11-15 SURGERY — URETEROSCOPY, WITH LITHOTRIPSY USING HOLMIUM LASER
Anesthesia: General | Site: Renal | Laterality: Right

## 2018-11-15 MED ORDER — DEXAMETHASONE SODIUM PHOSPHATE 10 MG/ML IJ SOLN
INTRAMUSCULAR | Status: AC
  Filled 2018-11-15: qty 1

## 2018-11-15 MED ORDER — GABAPENTIN 300 MG PO CAPS
ORAL_CAPSULE | ORAL | Status: AC
  Filled 2018-11-15: qty 1

## 2018-11-15 MED ORDER — ONDANSETRON HCL 4 MG/2ML IJ SOLN
INTRAMUSCULAR | Status: AC
  Filled 2018-11-15: qty 2

## 2018-11-15 MED ORDER — CEFAZOLIN SODIUM-DEXTROSE 2-4 GM/100ML-% IV SOLN
2.0000 g | Freq: Once | INTRAVENOUS | Status: AC
  Administered 2018-11-15: 2 g via INTRAVENOUS
  Filled 2018-11-15: qty 100

## 2018-11-15 MED ORDER — KETOROLAC TROMETHAMINE 30 MG/ML IJ SOLN
30.0000 mg | Freq: Once | INTRAMUSCULAR | Status: DC | PRN
  Filled 2018-11-15: qty 1

## 2018-11-15 MED ORDER — SODIUM CHLORIDE 0.9 % IR SOLN
Status: DC | PRN
  Administered 2018-11-15: 3000 mL

## 2018-11-15 MED ORDER — PROPOFOL 10 MG/ML IV BOLUS
INTRAVENOUS | Status: DC | PRN
  Administered 2018-11-15: 150 mg via INTRAVENOUS
  Administered 2018-11-15: 20 mg via INTRAVENOUS

## 2018-11-15 MED ORDER — HYDROCODONE-ACETAMINOPHEN 7.5-325 MG PO TABS
1.0000 | ORAL_TABLET | Freq: Once | ORAL | Status: DC | PRN
  Filled 2018-11-15: qty 1

## 2018-11-15 MED ORDER — LIDOCAINE 2% (20 MG/ML) 5 ML SYRINGE
INTRAMUSCULAR | Status: DC | PRN
  Administered 2018-11-15: 80 mg via INTRAVENOUS

## 2018-11-15 MED ORDER — IOHEXOL 300 MG/ML  SOLN
INTRAMUSCULAR | Status: DC | PRN
  Administered 2018-11-15: 10 mL

## 2018-11-15 MED ORDER — ACETAMINOPHEN 500 MG PO TABS
ORAL_TABLET | ORAL | Status: AC
  Filled 2018-11-15: qty 2

## 2018-11-15 MED ORDER — KETOROLAC TROMETHAMINE 30 MG/ML IJ SOLN
INTRAMUSCULAR | Status: DC | PRN
  Administered 2018-11-15: 30 mg via INTRAVENOUS

## 2018-11-15 MED ORDER — ACETAMINOPHEN 500 MG PO TABS
1000.0000 mg | ORAL_TABLET | Freq: Once | ORAL | Status: AC
  Administered 2018-11-15: 1000 mg via ORAL
  Filled 2018-11-15: qty 2

## 2018-11-15 MED ORDER — ONDANSETRON HCL 4 MG/2ML IJ SOLN
4.0000 mg | Freq: Once | INTRAMUSCULAR | Status: DC | PRN
  Filled 2018-11-15: qty 2

## 2018-11-15 MED ORDER — CEFAZOLIN SODIUM-DEXTROSE 2-4 GM/100ML-% IV SOLN
INTRAVENOUS | Status: AC
  Filled 2018-11-15: qty 100

## 2018-11-15 MED ORDER — KETOROLAC TROMETHAMINE 30 MG/ML IJ SOLN
INTRAMUSCULAR | Status: AC
  Filled 2018-11-15: qty 1

## 2018-11-15 MED ORDER — DEXAMETHASONE SODIUM PHOSPHATE 4 MG/ML IJ SOLN
INTRAMUSCULAR | Status: DC | PRN
  Administered 2018-11-15: 10 mg via INTRAVENOUS

## 2018-11-15 MED ORDER — FENTANYL CITRATE (PF) 100 MCG/2ML IJ SOLN
INTRAMUSCULAR | Status: DC | PRN
  Administered 2018-11-15: 25 ug via INTRAVENOUS
  Administered 2018-11-15: 50 ug via INTRAVENOUS
  Administered 2018-11-15: 25 ug via INTRAVENOUS

## 2018-11-15 MED ORDER — HYDROMORPHONE HCL 1 MG/ML IJ SOLN
0.2500 mg | INTRAMUSCULAR | Status: DC | PRN
  Filled 2018-11-15: qty 0.5

## 2018-11-15 MED ORDER — MEPERIDINE HCL 25 MG/ML IJ SOLN
6.2500 mg | INTRAMUSCULAR | Status: DC | PRN
  Filled 2018-11-15: qty 1

## 2018-11-15 MED ORDER — PROPOFOL 10 MG/ML IV BOLUS
INTRAVENOUS | Status: AC
  Filled 2018-11-15: qty 20

## 2018-11-15 MED ORDER — MIDAZOLAM HCL 5 MG/5ML IJ SOLN
INTRAMUSCULAR | Status: DC | PRN
  Administered 2018-11-15: 2 mg via INTRAVENOUS

## 2018-11-15 MED ORDER — GABAPENTIN 300 MG PO CAPS
300.0000 mg | ORAL_CAPSULE | Freq: Once | ORAL | Status: AC
  Administered 2018-11-15: 300 mg via ORAL
  Filled 2018-11-15: qty 1

## 2018-11-15 MED ORDER — LIDOCAINE 2% (20 MG/ML) 5 ML SYRINGE
INTRAMUSCULAR | Status: AC
  Filled 2018-11-15: qty 5

## 2018-11-15 MED ORDER — FENTANYL CITRATE (PF) 100 MCG/2ML IJ SOLN
INTRAMUSCULAR | Status: AC
  Filled 2018-11-15: qty 2

## 2018-11-15 MED ORDER — ONDANSETRON HCL 4 MG/2ML IJ SOLN
INTRAMUSCULAR | Status: DC | PRN
  Administered 2018-11-15: 4 mg via INTRAVENOUS

## 2018-11-15 MED ORDER — LACTATED RINGERS IV SOLN
INTRAVENOUS | Status: DC
  Administered 2018-11-15 (×2): via INTRAVENOUS
  Filled 2018-11-15: qty 1000

## 2018-11-15 MED ORDER — MIDAZOLAM HCL 2 MG/2ML IJ SOLN
INTRAMUSCULAR | Status: AC
  Filled 2018-11-15: qty 2

## 2018-11-15 SURGICAL SUPPLY — 29 items
BAG DRAIN URO-CYSTO SKYTR STRL (DRAIN) ×3 IMPLANT
BAG DRN UROCATH (DRAIN) ×1
BASKET ZERO TIP NITINOL 2.4FR (BASKET) ×2 IMPLANT
BSKT STON RTRVL ZERO TP 2.4FR (BASKET) ×1
CATH URET 5FR 28IN CONE TIP (BALLOONS)
CATH URET 5FR 28IN OPEN ENDED (CATHETERS) IMPLANT
CATH URET 5FR 70CM CONE TIP (BALLOONS) IMPLANT
CATH URET DUAL LUMEN 6-10FR 50 (CATHETERS) IMPLANT
CLOTH BEACON ORANGE TIMEOUT ST (SAFETY) ×3 IMPLANT
FIBER LASER TRAC TIP (UROLOGICAL SUPPLIES) ×2 IMPLANT
GLOVE BIO SURGEON STRL SZ7.5 (GLOVE) ×3 IMPLANT
GLOVE BIOGEL PI IND STRL 8.5 (GLOVE) IMPLANT
GLOVE BIOGEL PI INDICATOR 8.5 (GLOVE) ×4
GLOVE INDICATOR 8.5 STRL (GLOVE) ×2 IMPLANT
GOWN STRL REUS W/TWL LRG LVL3 (GOWN DISPOSABLE) ×5 IMPLANT
GOWN STRL REUS W/TWL XL LVL3 (GOWN DISPOSABLE) ×3 IMPLANT
GUIDEWIRE ANG ZIPWIRE 038X150 (WIRE) ×2 IMPLANT
GUIDEWIRE STR DUAL SENSOR (WIRE) ×3 IMPLANT
INFUSOR MANOMETER BAG 3000ML (MISCELLANEOUS) ×1 IMPLANT
IV NS IRRIG 3000ML ARTHROMATIC (IV SOLUTION) ×4 IMPLANT
KIT TURNOVER CYSTO (KITS) ×3 IMPLANT
MANIFOLD NEPTUNE II (INSTRUMENTS) ×2 IMPLANT
NS IRRIG 500ML POUR BTL (IV SOLUTION) ×3 IMPLANT
PACK CYSTO (CUSTOM PROCEDURE TRAY) ×3 IMPLANT
SHEATH URET ACCESS 12FR/35CM (UROLOGICAL SUPPLIES) ×2 IMPLANT
STENT URET 6FRX24 CONTOUR (STENTS) ×2 IMPLANT
TUBE CONNECTING 12'X1/4 (SUCTIONS)
TUBE CONNECTING 12X1/4 (SUCTIONS) IMPLANT
TUBING UROLOGY SET (TUBING) ×2 IMPLANT

## 2018-11-15 NOTE — Anesthesia Procedure Notes (Signed)
Procedure Name: LMA Insertion Date/Time: 11/15/2018 11:47 AM Performed by: Barnet Glasgow, MD Pre-anesthesia Checklist: Patient identified, Emergency Drugs available, Suction available and Patient being monitored Patient Re-evaluated:Patient Re-evaluated prior to induction Oxygen Delivery Method: Circle system utilized Preoxygenation: Pre-oxygenation with 100% oxygen Induction Type: IV induction Ventilation: Mask ventilation without difficulty LMA: LMA inserted LMA Size: 4.0 Number of attempts: 1 Airway Equipment and Method: Bite block Placement Confirmation: positive ETCO2 Tube secured with: Tape Dental Injury: Teeth and Oropharynx as per pre-operative assessment

## 2018-11-15 NOTE — Interval H&P Note (Signed)
History and Physical Interval Note:  11/15/2018 11:34 AM  [place]  has presented today for surgery, with the diagnosis of RIGHT URETEROPELVIC JUNCTION STONE, RIGHT LOWER POLE STONE  The various methods of treatment have been discussed with the patient and family. After consideration of risks, benefits and other options for treatment, the patient has consented to  Procedure(s): URETEROSCOPY WITH HOLMIUM LASER LITHOTRIPSY/STENT PLACEMENT (Right) as a surgical intervention .  The patient's history has been reviewed, patient examined, no change in status, stable for surgery.  I have reviewed the patient's chart and labs.She hasnt passed the stone, no dysuria or fever. We went through all the scenario's of staged procedures - stent, f/u eswl. Etc. Also stone prevention, if there will be any fragments leftover. Questions were answered to the patient's, husband, and daughter's satisfaction.  She elects to proceed.    Jane Montes

## 2018-11-15 NOTE — Discharge Instructions (Signed)
Ureteral Stent Implantation, Care After Refer to this sheet in the next few weeks. These instructions provide you with information about caring for yourself after your procedure. Your health care provider may also give you more specific instructions. Your treatment has been planned according to current medical practices, but problems sometimes occur. Call your health care provider if you have any problems or questions after your procedure.  Removal of the stent: Remove the stent by pulling the string on Monday morning November 18, 2018  What can I expect after the procedure? After the procedure, it is common to have:  Nausea.  Mild pain when you urinate. You may feel this pain in your lower back or lower abdomen. Pain should stop within a few minutes after you urinate. This may last for up to 1 week.  A small amount of blood in your urine for several days. Follow these instructions at home:  Medicines  Take over-the-counter and prescription medicines only as told by your health care provider.  If you were prescribed an antibiotic medicine, take it as told by your health care provider. Do not stop taking the antibiotic even if you start to feel better.  Do not drive for 24 hours if you received a sedative.  Do not drive or operate heavy machinery while taking prescription pain medicines. Activity  Return to your normal activities as told by your health care provider. Ask your health care provider what activities are safe for you.  Do not lift anything that is heavier than 10 lb (4.5 kg). Follow this limit for 1 week after your procedure, or for as long as told by your health care provider. General instructions  Watch for any blood in your urine. Call your health care provider if the amount of blood in your urine increases.  If you have a catheter: ? Follow instructions from your health care provider about taking care of your catheter and collection bag. ? Do not take baths, swim, or  use a hot tub until your health care provider approves.  Drink enough fluid to keep your urine clear or pale yellow.  Keep all follow-up visits as told by your health care provider. This is important. Contact a health care provider if:  You have pain that gets worse or does not get better with medicine, especially pain when you urinate.  You have difficulty urinating.  You feel nauseous or you vomit repeatedly during a period of more than 2 days after the procedure. Get help right away if:  Your urine is dark red or has blood clots in it.  You are leaking urine (have incontinence).  The end of the stent comes out of your urethra.  You cannot urinate.  You have sudden, sharp, or severe pain in your abdomen or lower back.  You have a fever. This information is not intended to replace advice given to you by your health care provider. Make sure you discuss any questions you have with your health care provider. Document Released: 05/28/2013 Document Revised: 03/02/2016 Document Reviewed: 04/09/2015 Elsevier Interactive Patient Education  2019 [place] [facility] Instructions  Activity: Get plenty of rest for the remainder of the day. A responsible individual must stay with you for 24 hours following the procedure.  For the next 24 hours, DO NOT: -Drive a car -Paediatric nurse -Drink alcoholic beverages -Take any medication unless instructed by your physician -Make any legal decisions or sign important papers.  Meals: Start with liquid  foods such as gelatin or soup. Progress to regular foods as tolerated. Avoid greasy, spicy, heavy foods. If nausea and/or vomiting occur, drink only clear liquids until the nausea and/or vomiting subsides. Call your physician if vomiting continues.  Special Instructions/Symptoms: Your throat may feel dry or sore from the anesthesia or the breathing tube placed in your throat during surgery. If this causes discomfort,  gargle with warm salt water. The discomfort should disappear within 24 hours.  If you had a scopolamine patch placed behind your ear for the management of post- operative nausea and/or vomiting:  1. The medication in the patch is effective for 72 hours, after which it should be removed.  Wrap patch in a tissue and discard in the trash. Wash hands thoroughly with soap and water. 2. You may remove the patch earlier than 72 hours if you experience unpleasant side effects which may include dry mouth, dizziness or visual disturbances. 3. Avoid touching the patch. Wash your hands with soap and water after contact with the patch.

## 2018-11-15 NOTE — Transfer of Care (Signed)
Last Vitals:  Vitals Value Taken Time  BP 147/86 11/15/2018 12:58 PM  Temp    Pulse 77 11/15/2018 12:59 PM  Resp 17 11/15/2018 12:59 PM  SpO2 97 % 11/15/2018 12:59 PM  Vitals shown include unvalidated device data.  Last Pain:  Vitals:   11/15/18 0930  TempSrc: Oral     Immediate Anesthesia Transfer of Care Note  Patient: Jane Montes  Procedure(s) Performed: Procedure(s) (LRB): CYSTOSCOPY, RETROGRADE, URETEROSCOPY WITH HOLMIUM LASER LITHOTRIPSY/STENT PLACEMENT (Right) HOLMIUM LASER APPLICATION (Right)  Patient Location: PACU  Anesthesia Type: General  Level of Consciousness: awake, alert  and oriented  Airway & Oxygen Therapy: Patient Spontanous Breathing and Patient connected to nasal cannula oxygen  Post-op Assessment: Report given to PACU RN and Post -op Vital signs reviewed and stable  Post vital signs: Reviewed and stable  Complications: No apparent anesthesia complications

## 2018-11-15 NOTE — Op Note (Signed)
Preoperative diagnosis: Right UPJ stone, right lower pole stone Postoperative diagnosis: Right midpole stone, right lower pole stone  Procedure: Cystoscopy, right retrograde pyelogram, right ureteroscopy with holmium laser lithotripsy, stone basket extraction and right ureteral stent placement  Surgeon: Junious Silk  Anesthesia: General  Indication for procedure: 48 year old white female with symptomatic right UPJ stone.  She also had a right lower pole stone.  Findings: On cystoscopy the urethra and the bladder were unremarkable.  There was no stone or foreign body in the bladder.  Right retrograde pyelogram-this outlined a single ureter single collecting system unit that appeared normal apart from small filling defect at the right UPJ and some mild dilation of the renal pelvis and collecting system.  The right lower pole stone was visible on the scout image.  On right ureteroscopy the wires had not the UPJ stone into the right midpole.  The right UPJ was somewhat narrow and edematous with some erythema from the stone impaction. The right lower pole stone was noted.  It was quite dense.  Description of procedure: After consent was obtained the patient brought to the operating room.  After adequate anesthesia she was placed lithotomy position prepped and draped in usual sterile fashion.  A timeout was performed to confirm the patient and procedure.  The cystoscope was passed per urethra.  The right ureteral orifice was cannulated with a 6 [place] open-ended catheter and retrograde injection of contrast was performed.  A sensor wire was advanced and coiled in the collecting system.  The bladder was drained and scope removed.  I used the access sheath to get 2 wires and went beside the Glidewire leaving it as the safety wire.  The access sheath passed with minimal resistance.  The dual-lumen ureteroscope, digital, was advanced into the right collecting system and the right midpole stone was found  consistent with the stone that had been at the UPJ.  It was grasped but still too big to simply pull through the UPJ so I dropped it in the upper pole and used a 200 m laser fiber at 0.8 and 8 to break it into 2 pieces.  The 2 pieces were removed sequentially with a 0 tip basket.  I then turned my attention to the right lower pole stone and it was fragmented at a combination of 0.4 and 50 and 1 and 8.  It was quite dense.  After about a third of it was dusted I grabbed it with the 0 tip basket and dropped it in the upper pole for ease of fragmentation.  It was finished off here and the largest fragments were removed.  The collecting system was carefully inspected and one 2 to 3 mm fragment was found in the lower pole and removed but no other significant fragments were noted.  The renal pelvis and ureter appeared normal on the way out as the ureteroscope and the access sheath were backed out together.  There was no stone fragment or ureteral injury.  The wire was backloaded on the cystoscope and a 6 x 24 cm stent advanced.  The wire was removed with a good coil seen in the kidney and a good coil in the bladder.  She was awakened and taken to the recovery room in stable condition.  Complications: None  Blood loss: Minimal  Specimens: Stone fragments to patient/office lab  Drains: 6 x 24 cm right ureteral stent with tether/string  Disposition: Patient stable to PACU

## 2018-11-17 NOTE — Anesthesia Postprocedure Evaluation (Signed)
Anesthesia Post Note  Patient: Jane Montes  Procedure(s) Performed: CYSTOSCOPY, RETROGRADE, URETEROSCOPY WITH HOLMIUM LASER LITHOTRIPSY/STENT PLACEMENT (Right Renal) HOLMIUM LASER APPLICATION (Right Renal)     Patient location during evaluation: PACU Anesthesia Type: General Level of consciousness: awake and alert Pain management: pain level controlled Vital Signs Assessment: post-procedure vital signs reviewed and stable Respiratory status: spontaneous breathing, nonlabored ventilation, respiratory function stable and patient connected to nasal cannula oxygen Cardiovascular status: blood pressure returned to baseline and stable Postop Assessment: no apparent nausea or vomiting Anesthetic complications: no    Last Vitals:  Vitals:   11/15/18 1315 11/15/18 1450  BP: (!) 142/84 (!) 144/77  Pulse: 70 71  Resp: 16 17  Temp:  36.8 C  SpO2: 99% 100%    Last Pain:  Vitals:   11/15/18 1450  TempSrc:   PainSc: 4                  Barnet Glasgow

## 2018-11-18 ENCOUNTER — Encounter (HOSPITAL_BASED_OUTPATIENT_CLINIC_OR_DEPARTMENT_OTHER): Payer: Self-pay | Admitting: Urology

## 2018-11-19 ENCOUNTER — Ambulatory Visit: Payer: 59 | Admitting: Cardiovascular Disease

## 2018-11-26 DIAGNOSIS — R3915 Urgency of urination: Secondary | ICD-10-CM | POA: Diagnosis not present

## 2018-11-26 DIAGNOSIS — R8271 Bacteriuria: Secondary | ICD-10-CM | POA: Diagnosis not present

## 2018-11-26 DIAGNOSIS — R1084 Generalized abdominal pain: Secondary | ICD-10-CM | POA: Diagnosis not present

## 2018-11-26 MED FILL — OXYBUTYNIN 5 MG TABLET: 5 | 10 days supply | Qty: 30 | Fill #0

## 2018-11-26 MED FILL — TAMSULOSIN HCL 0.4 MG CAP: 0.4 | 30 days supply | Qty: 30 | Fill #0

## 2018-11-28 MED FILL — CEPHALEXIN 500 MG CAPSULE: 500 | 7 days supply | Qty: 21 | Fill #0

## 2018-11-29 MED FILL — NITROFURANTOIN MCR 100 MG C: 100 | 5 days supply | Qty: 10 | Fill #0

## 2018-12-24 MED FILL — FENOFIBRATE 145 MG TABLET: 145 | 90 days supply | Qty: 90 | Fill #2

## 2018-12-24 MED FILL — ATORVASTATIN 20 MG TABLET: 20 | 90 days supply | Qty: 90 | Fill #1

## 2018-12-27 DIAGNOSIS — N200 Calculus of kidney: Secondary | ICD-10-CM | POA: Diagnosis not present

## 2019-01-06 ENCOUNTER — Other Ambulatory Visit: Payer: Self-pay

## 2019-01-06 ENCOUNTER — Ambulatory Visit (INDEPENDENT_AMBULATORY_CARE_PROVIDER_SITE_OTHER): Payer: 59 | Admitting: Family

## 2019-01-06 ENCOUNTER — Encounter: Payer: Self-pay | Admitting: Family

## 2019-01-06 DIAGNOSIS — E785 Hyperlipidemia, unspecified: Secondary | ICD-10-CM | POA: Diagnosis not present

## 2019-01-06 DIAGNOSIS — N200 Calculus of kidney: Secondary | ICD-10-CM

## 2019-01-06 DIAGNOSIS — I10 Essential (primary) hypertension: Secondary | ICD-10-CM | POA: Diagnosis not present

## 2019-01-06 MED ORDER — ATORVASTATIN CALCIUM 20 MG PO TABS: 20.0000 mg | ORAL_TABLET | Freq: Every evening | ORAL | 1 refills | Status: DC

## 2019-01-06 NOTE — Progress Notes (Signed)
Virtual Visit via Video Note  I connected with Jane Montes on 01/06/19 at  7:20 AM EDT by a video enabled telemedicine application and verified that I am speaking with the correct person using two identifiers.   I discussed the limitations of evaluation and management by telemedicine and the availability of in person appointments. The patient expressed understanding and agreed to proceed.  History of Present Illness:  HTN-Patient reports that she has been checking her blood pressure regularly. She reports sbp is back into the one- teens. Reports weight was 182.4 this AM.  Wt Readings from Last 3 Encounters:  11/15/18 199 lb 12.8 oz (90.6 kg)  10/07/18 199 lb (90.3 kg)  06/24/18 195 lb (88.5 kg)    BP Readings from Last 3 Encounters:  11/15/18 (!) 144/77  10/07/18 (!) 141/81  07/23/18 127/82   Hyperlipidemia- maintained on fenofibrate and lipitor.   Lab Results  Component Value Date   CHOL 141 10/08/2018   HDL 45.40 10/08/2018   LDLCALC 66 10/08/2018   LDLDIRECT 105.0 04/02/2018   TRIG 146.0 10/08/2018   CHOLHDL 3 10/08/2018   Since her last visit with [place] she underwent cystoscopy/ureteroscopy/stent placement with Dr. Festus Aloe.  She reports feeling better since that time and has since had a follow-up visit with him in the office.   She also reports that since her last visit with [place] her father unfortunately passed away secondary to suicide.  He had some Parkinson's disease as well as some early dementia and history of bipolar disorder.  He was having some issues with dehydration at the time that he committed suicide.  Observations/Objective: General: Awake, alert, no acute distress Respiratory: Breathing is even and nonlabored. Psychiatric: Patient is calm, with pleasant affect. Neurology: Positive facial symmetry is noted, patient is awake and oriented x 3  Assessment and Plan: Hypertension-blood pressure is stable per patient readings. She has been working hard on  weight loss and I encouraged her to continue the good work.   History of kidney stone- resolved following cystoscopy/lithotripsy.  Defer management to urology.  Hyperlipidemia-stable on current medications.  I suspect that with her recent weight loss that her lipids are probably even better at this point.  Plan to repeat lipid panel next visit.  Would consider decreasing her lipid medication at that time.    Follow Up Instructions:    I discussed the assessment and treatment plan with the patient. The patient was provided an opportunity to ask questions and all were answered. The patient agreed with the plan and demonstrated an understanding of the instructions.   The patient was advised to call back or seek an in-person evaluation if the symptoms worsen or if the condition fails to improve as anticipated.  I provided 10 minutes of non-face-to-face time during this encounter.   Nance Pear, NP

## 2019-01-11 DIAGNOSIS — N200 Calculus of kidney: Secondary | ICD-10-CM | POA: Diagnosis not present

## 2019-02-12 DIAGNOSIS — N200 Calculus of kidney: Secondary | ICD-10-CM | POA: Diagnosis not present

## 2019-03-31 MED FILL — FENOFIBRATE 145 MG TABLET: 145 | 90 days supply | Qty: 90 | Fill #3

## 2019-03-31 MED FILL — LISINOPRIL 5 MG TABLET: 5 | 30 days supply | Qty: 30 | Fill #1

## 2019-03-31 MED FILL — ATORVASTATIN 20 MG TABLET: 20 | 90 days supply | Qty: 90 | Fill #0

## 2019-04-28 DIAGNOSIS — R1084 Generalized abdominal pain: Secondary | ICD-10-CM | POA: Diagnosis not present

## 2019-04-28 DIAGNOSIS — N200 Calculus of kidney: Secondary | ICD-10-CM | POA: Diagnosis not present

## 2019-04-28 DIAGNOSIS — D3501 Benign neoplasm of right adrenal gland: Secondary | ICD-10-CM | POA: Diagnosis not present

## 2019-06-27 ENCOUNTER — Other Ambulatory Visit: Payer: Self-pay | Admitting: Cardiovascular Disease

## 2019-06-27 MED FILL — FENOFIBRATE 145 MG TABS: 145 | 30 days supply | Qty: 30 | Fill #0

## 2019-06-27 MED FILL — ATORVASTATIN 20 MG TABLET: 20 | 90 days supply | Qty: 90 | Fill #1

## 2019-07-01 DIAGNOSIS — R1084 Generalized abdominal pain: Secondary | ICD-10-CM | POA: Diagnosis not present

## 2019-07-01 DIAGNOSIS — N132 Hydronephrosis with renal and ureteral calculous obstruction: Secondary | ICD-10-CM | POA: Diagnosis not present

## 2019-07-01 DIAGNOSIS — N200 Calculus of kidney: Secondary | ICD-10-CM | POA: Diagnosis not present

## 2019-07-01 MED FILL — KETOROLAC 10 MG TABLET: 10 | 5 days supply | Qty: 20 | Fill #0

## 2019-07-01 MED FILL — ONDANSETRON ODT 8 MG TABLET: 8 | 4 days supply | Qty: 10 | Fill #0

## 2019-07-01 MED FILL — OXYCODONE-ACETAMINOPHEN 5-3: 5-325 | 3 days supply | Qty: 20 | Fill #0

## 2019-07-16 DIAGNOSIS — R82994 Hypercalciuria: Secondary | ICD-10-CM | POA: Diagnosis not present

## 2019-07-16 DIAGNOSIS — N201 Calculus of ureter: Secondary | ICD-10-CM | POA: Diagnosis not present

## 2019-07-25 DIAGNOSIS — Z1231 Encounter for screening mammogram for malignant neoplasm of breast: Secondary | ICD-10-CM | POA: Diagnosis not present

## 2019-07-25 DIAGNOSIS — Z1272 Encounter for screening for malignant neoplasm of vagina: Secondary | ICD-10-CM | POA: Diagnosis not present

## 2019-07-25 DIAGNOSIS — Z124 Encounter for screening for malignant neoplasm of cervix: Secondary | ICD-10-CM | POA: Diagnosis not present

## 2019-07-25 DIAGNOSIS — R87612 Low grade squamous intraepithelial lesion on cytologic smear of cervix (LGSIL): Secondary | ICD-10-CM | POA: Diagnosis not present

## 2019-07-25 DIAGNOSIS — Z01419 Encounter for gynecological examination (general) (routine) without abnormal findings: Secondary | ICD-10-CM | POA: Diagnosis not present

## 2019-07-25 LAB — HM PAP SMEAR

## 2019-07-25 MED FILL — ALPRAZolam 0.5 MG TABS: 0.5 | 10 days supply | Qty: 30 | Fill #0

## 2019-07-28 LAB — HM MAMMOGRAPHY

## 2019-07-28 MED FILL — INDAPAMIDE 1.25 MG TABS: 1.25 | 90 days supply | Qty: 90 | Fill #0

## 2019-08-04 LAB — HM PAP SMEAR: HM Pap smear: ABNORMAL

## 2019-08-27 DIAGNOSIS — N890 Mild vaginal dysplasia: Secondary | ICD-10-CM | POA: Diagnosis not present

## 2019-08-27 DIAGNOSIS — R87612 Low grade squamous intraepithelial lesion on cytologic smear of cervix (LGSIL): Secondary | ICD-10-CM | POA: Diagnosis not present

## 2019-08-27 DIAGNOSIS — R87622 Low grade squamous intraepithelial lesion on cytologic smear of vagina (LGSIL): Secondary | ICD-10-CM | POA: Diagnosis not present

## 2019-08-29 MED FILL — ALPRAZolam 0.5 MG TABS: 0.5 | 10 days supply | Qty: 30 | Fill #1

## 2019-11-03 ENCOUNTER — Other Ambulatory Visit: Payer: Self-pay | Admitting: Family

## 2019-11-03 MED FILL — INDAPAMIDE 1.25 MG TABS: 1.25 | 30 days supply | Qty: 30 | Fill #1

## 2019-11-04 MED FILL — ATORVASTATIN 20 MG TABLET: 20 | 30 days supply | Qty: 30 | Fill #0

## 2019-11-04 NOTE — Telephone Encounter (Signed)
Pt overdue for follow up. Please contact pt to schedule OV with me.

## 2019-11-04 NOTE — Telephone Encounter (Signed)
Done

## 2019-11-20 ENCOUNTER — Encounter: Payer: Self-pay | Admitting: Family

## 2019-11-21 ENCOUNTER — Ambulatory Visit (HOSPITAL_BASED_OUTPATIENT_CLINIC_OR_DEPARTMENT_OTHER)
Admission: RE | Admit: 2019-11-21 | Discharge: 2019-11-21 | Disposition: A | Discharge status: Home / Self Care | Payer: 59 | Source: Ambulatory Visit | Attending: Family Medicine | Admitting: Family Medicine

## 2019-11-21 ENCOUNTER — Other Ambulatory Visit: Payer: Self-pay

## 2019-11-21 ENCOUNTER — Ambulatory Visit: Payer: 59 | Admitting: Family Medicine

## 2019-11-21 ENCOUNTER — Other Ambulatory Visit: Payer: Self-pay | Admitting: Family Medicine

## 2019-11-21 ENCOUNTER — Encounter: Payer: Self-pay | Admitting: Family Medicine

## 2019-11-21 VITALS — BP 130/100 | HR 82 | Temp 97.3°F | Resp 18 | Ht 62.0 in | Wt 203.8 lb

## 2019-11-21 DIAGNOSIS — R1011 Right upper quadrant pain: Secondary | ICD-10-CM | POA: Diagnosis present

## 2019-11-21 DIAGNOSIS — R140 Abdominal distension (gaseous): Secondary | ICD-10-CM

## 2019-11-21 LAB — POC URINALSYSI DIPSTICK (AUTOMATED)
Bilirubin, UA: NEGATIVE
Blood, UA: NEGATIVE
Glucose, UA: NEGATIVE
Ketones, UA: NEGATIVE
Leukocytes, UA: NEGATIVE
Nitrite, UA: NEGATIVE
Protein, UA: NEGATIVE
Spec Grav, UA: 1.015 (ref 1.010–1.025)
Urobilinogen, UA: 0.2 E.U./dL
pH, UA: 6 (ref 5.0–8.0)

## 2019-11-21 LAB — POCT URINE PREGNANCY: Preg Test, Ur: NEGATIVE

## 2019-11-21 NOTE — Progress Notes (Signed)
Patient ID: Jane Montes, female    DOB: [birth year]  Age: 49 y.o. MRN: [id number]    Subjective:  Subjective  HPI Jane Montes presents for RUQ pain x 3 days , + nausea , no vomiting , no diarrhea no fever    Pt is not eating much.  It is not keeping her from working   Review of Systems  Constitutional: Negative for appetite change, chills, diaphoresis, fatigue, fever and unexpected weight change.  Eyes: Negative for pain, redness and visual disturbance.  Respiratory: Negative for cough, chest tightness, shortness of breath and wheezing.   Cardiovascular: Negative for chest pain, palpitations and leg swelling.  Gastrointestinal: Positive for abdominal pain. Negative for abdominal distention, blood in stool, constipation and diarrhea.  Endocrine: Negative for cold intolerance, heat intolerance, polydipsia, polyphagia and polyuria.  Genitourinary: Negative for difficulty urinating, dysuria and frequency.  Neurological: Negative for dizziness, light-headedness, numbness and headaches.    History Past Medical History:  Diagnosis Date  . Adenoma of right adrenal gland 10/12/2018   Noted on MRI Abd, measuring 2.2 x 2.4 x 2.7 cm  . Aortic atherosclerosis ([place]) 10/10/2018   Noted on [place]  . Fatty liver 10/12/2018   Noted on MRI Abd  . GERD (gastroesophageal reflux disease) 09/16/2015   Mod. noted on KUB  . Heart palpitations   . History of chest pain   . History of dizziness   . History of hiatal hernia 09/16/2015   Small, noted on KUB  . History of migraine   . Hydronephrosis of right kidney 10/12/2018   Mild, noted on MRI Abd  . Hyperlipidemia   . Hypertension    no longer on medication  . Left anterior fascicular block (LAFB) 01/10/2013   noted on EKG  . Left kidney mass 10/12/2018   Hyperechoic mass mid left kidney measuring 1.0 x 1.0 x 1.2 cm, noted on [place] AB  . Nephrolithiasis 10/12/2018   Right, Noted on MRI Abd  . Pelvic pain    Chronic, right lower quadrant  . Wears  contact lenses   . Wears glasses     She has a past surgical history that includes Endometrial ablation; Vaginal hysterectomy (2007); Wisdom tooth extraction; Ureteroscopy with holmium laser lithotripsy (Right, 11/15/2018); and Holmium laser application (Right, [id number]).   Her family history includes Alcohol abuse in her brother; Bipolar disorder in her father; COPD in her brother, father, and maternal grandmother; Cervical cancer in her sister; Depression in her paternal grandmother; Diabetes in her mother; Heart attack in her maternal grandmother and paternal grandmother; Heart attack (age of onset: 22) in her mother; Heart disease in her mother and paternal grandmother; Hyperlipidemia in her brother and maternal grandmother; Hypertension in her brother and father; Parkinson's disease in her father; Stroke in her brother.She reports that she has never smoked. She has never used smokeless tobacco. She reports current alcohol use. She reports that she does not use drugs.  Current Outpatient Medications on File Prior to Visit  Medication Sig Dispense Refill  . atorvastatin (LIPITOR) 20 MG tablet TAKE 1 TABLET (20 MG TOTAL) BY MOUTH EVERY EVENING. 30 tablet 0  . fenofibrate (TRICOR) 145 MG tablet TAKE 1 TABLET BY MOUTH DAILY. 30 tablet 0  . indapamide (LOZOL) 1.25 MG tablet Take 1.25 mg by mouth daily.     . Multiple Vitamins-Minerals (MULTIVITAMIN PO) Take 1 tablet by mouth daily.     . Omega-3 Fatty Acids (FISH OIL) 1000 MG CAPS Take 2 capsules (2,000 mg  total) by mouth 2 (two) times daily.  0  . acetaminophen (TYLENOL) 500 MG tablet Take 500 mg by mouth every 6 (six) hours as needed.    Marland Kitchen ibuprofen (ADVIL,MOTRIN) 600 MG tablet Take 600 mg by mouth every 6 (six) hours as needed.     No current facility-administered medications on file prior to visit.     Objective:  Objective  Physical Exam Vitals and nursing note reviewed.  Constitutional:      Appearance: She is well-developed.  HENT:      Head: Normocephalic and atraumatic.  Eyes:     Conjunctiva/sclera: Conjunctivae normal.  Neck:     Thyroid: No thyromegaly.     Vascular: No carotid bruit or JVD.  Cardiovascular:     Rate and Rhythm: Normal rate and regular rhythm.     Heart sounds: Normal heart sounds. No murmur.  Pulmonary:     Effort: Pulmonary effort is normal. No respiratory distress.     Breath sounds: Normal breath sounds. No wheezing or rales.  Chest:     Chest wall: No tenderness.  Abdominal:     General: Bowel sounds are normal. There is distension.     Palpations: Abdomen is soft.     Tenderness: There is abdominal tenderness in the right upper quadrant. There is no right CVA tenderness, guarding or rebound.  Musculoskeletal:     Cervical back: Normal range of motion and neck supple.  Neurological:     Mental Status: She is alert and oriented to person, place, and time.    BP (!) 130/100 (BP Location: Left Arm, Patient Position: Sitting, Cuff Size: Normal)   Pulse 82   Temp (!) 97.3 F (36.3 C) (Temporal)   Resp 18   Ht 5\' 2"  (1.575 m)   Wt 203 lb 12.8 oz (92.4 kg)   SpO2 100%   BMI 37.28 kg/m  Wt Readings from Last 3 Encounters:  11/21/19 203 lb 12.8 oz (92.4 kg)  11/15/18 199 lb 12.8 oz (90.6 kg)  10/07/18 199 lb (90.3 kg)     Lab Results  Component Value Date   WBC 11.0 (H) 11/21/2019   HGB 13.8 11/21/2019   HCT 39.6 11/21/2019   PLT 352 11/21/2019   GLUCOSE 86 11/21/2019   CHOL 141 10/08/2018   TRIG 146.0 10/08/2018   HDL 45.40 10/08/2018   LDLDIRECT 105.0 04/02/2018   LDLCALC 66 10/08/2018   ALT 23 11/21/2019   AST 20 11/21/2019   NA 137 11/21/2019   K 3.6 11/21/2019   CL 100 11/21/2019   CREATININE 1.03 11/21/2019   BUN 18 11/21/2019   CO2 24 11/21/2019   TSH 1.66 04/02/2018   INR 0.92 09/27/2009    No results found.   Assessment & Plan:  Plan  I am having Jane Montes maintain her Multiple Vitamins-Minerals (MULTIVITAMIN PO), Fish Oil, acetaminophen,  ibuprofen, fenofibrate, atorvastatin, and indapamide.  No orders of the defined types were placed in this encounter.   Problem List Items Addressed This Visit    None    Visit Diagnoses    RUQ pain    -  Primary   Relevant Orders   US Abdomen Limited RUQ (Completed)   POCT Urinalysis Dipstick (Automated) (Completed)   POCT urine pregnancy (Completed)   CBC with Differential/Platelet (Completed)   Comprehensive metabolic panel (Completed)    check labs and [place] today If pain worsens -- go to ER   Follow-up: Return if symptoms worsen or fail to improve.  Ann Held, DO

## 2019-11-21 NOTE — Patient Instructions (Signed)
Abdominal Pain, Adult Pain in the abdomen (abdominal pain) can be caused by many things. Often, abdominal pain is not serious and it gets better with no treatment or by being treated at home. However, sometimes abdominal pain is serious. Your health care provider will ask questions about your medical history and do a physical exam to try to determine the cause of your abdominal pain. Follow these instructions at home:  Medicines  Take over-the-counter and prescription medicines only as told by your health care provider.  Do not take a laxative unless told by your health care provider. General instructions  Watch your condition for any changes.  Drink enough fluid to keep your urine pale yellow.  Keep all follow-up visits as told by your health care provider. This is important. Contact a health care provider if:  Your abdominal pain changes or gets worse.  You are not hungry or you lose weight without trying.  You are constipated or have diarrhea for more than 2-3 days.  You have pain when you urinate or have a bowel movement.  Your abdominal pain wakes you up at night.  Your pain gets worse with meals, after eating, or with certain foods.  You are vomiting and cannot keep anything down.  You have a fever.  You have blood in your urine. Get help right away if:  Your pain does not go away as soon as your health care provider told you to expect.  You cannot stop vomiting.  Your pain is only in areas of the abdomen, such as the right side or the left lower portion of the abdomen. Pain on the right side could be caused by appendicitis.  You have bloody or black stools, or stools that look like tar.  You have severe pain, cramping, or bloating in your abdomen.  You have signs of dehydration, such as: ? Dark urine, very little urine, or no urine. ? Cracked lips. ? Dry mouth. ? Sunken eyes. ? Sleepiness. ? Weakness.  You have trouble breathing or chest  pain. Summary  Often, abdominal pain is not serious and it gets better with no treatment or by being treated at home. However, sometimes abdominal pain is serious.  Watch your condition for any changes.  Take over-the-counter and prescription medicines only as told by your health care provider.  Contact a health care provider if your abdominal pain changes or gets worse.  Get help right away if you have severe pain, cramping, or bloating in your abdomen. This information is not intended to replace advice given to you by your health care provider. Make sure you discuss any questions you have with your health care provider. Document Revised: 02/03/2019 Document Reviewed: 02/03/2019 Elsevier Patient Education  2020 Elsevier Inc.  

## 2019-11-22 LAB — CBC WITH DIFFERENTIAL/PLATELET
Absolute Monocytes: 792 cells/uL (ref 200–950)
Basophils Absolute: 110 cells/uL (ref 0–200)
Basophils Relative: 1 %
Eosinophils Absolute: 275 cells/uL (ref 15–500)
Eosinophils Relative: 2.5 %
HCT: 39.6 % (ref 35.0–45.0)
Hemoglobin: 13.8 g/dL (ref 11.7–15.5)
Lymphs Abs: 3388 cells/uL (ref 850–3900)
MCH: 31.8 pg (ref 27.0–33.0)
MCHC: 34.8 g/dL (ref 32.0–36.0)
MCV: 91.2 fL (ref 80.0–100.0)
MPV: 9.6 fL (ref 7.5–12.5)
Monocytes Relative: 7.2 %
Neutro Abs: 6435 cells/uL (ref 1500–7800)
Neutrophils Relative %: 58.5 %
Platelets: 352 10*3/uL (ref 140–400)
RBC: 4.34 10*6/uL (ref 3.80–5.10)
RDW: 12.8 % (ref 11.0–15.0)
Total Lymphocyte: 30.8 %
WBC: 11 10*3/uL — ABNORMAL HIGH (ref 3.8–10.8)

## 2019-11-22 LAB — COMPREHENSIVE METABOLIC PANEL
AG Ratio: 1.7 (calc) (ref 1.0–2.5)
ALT: 23 U/L (ref 6–29)
AST: 20 U/L (ref 10–35)
Albumin: 4.5 g/dL (ref 3.6–5.1)
Alkaline phosphatase (APISO): 86 U/L (ref 31–125)
BUN: 18 mg/dL (ref 7–25)
CO2: 24 mmol/L (ref 20–32)
Calcium: 9.3 mg/dL (ref 8.6–10.2)
Chloride: 100 mmol/L (ref 98–110)
Creat: 1.03 mg/dL (ref 0.50–1.10)
Globulin: 2.6 g/dL (calc) (ref 1.9–3.7)
Glucose, Bld: 86 mg/dL (ref 65–99)
Potassium: 3.6 mmol/L (ref 3.5–5.3)
Sodium: 137 mmol/L (ref 135–146)
Total Bilirubin: 0.5 mg/dL (ref 0.2–1.2)
Total Protein: 7.1 g/dL (ref 6.1–8.1)

## 2019-11-25 ENCOUNTER — Other Ambulatory Visit: Payer: Self-pay

## 2019-11-25 ENCOUNTER — Ambulatory Visit (HOSPITAL_BASED_OUTPATIENT_CLINIC_OR_DEPARTMENT_OTHER)
Admission: RE | Admit: 2019-11-25 | Discharge: 2019-11-25 | Disposition: A | Discharge status: Home / Self Care | Payer: 59 | Source: Ambulatory Visit | Attending: Family Medicine | Admitting: Family Medicine

## 2019-11-25 ENCOUNTER — Encounter (HOSPITAL_BASED_OUTPATIENT_CLINIC_OR_DEPARTMENT_OTHER): Payer: Self-pay

## 2019-11-25 DIAGNOSIS — R140 Abdominal distension (gaseous): Secondary | ICD-10-CM | POA: Diagnosis not present

## 2019-11-25 MED ORDER — IOHEXOL 300 MG/ML  SOLN
100.0000 mL | Freq: Once | INTRAMUSCULAR | Status: AC | PRN
  Administered 2019-11-25: 15:00:00 100 mL via INTRAVENOUS

## 2019-12-03 ENCOUNTER — Other Ambulatory Visit: Payer: Self-pay

## 2019-12-03 ENCOUNTER — Ambulatory Visit: Payer: 59 | Admitting: Family

## 2019-12-03 ENCOUNTER — Telehealth: Payer: Self-pay | Admitting: Family

## 2019-12-03 ENCOUNTER — Other Ambulatory Visit: Payer: Self-pay | Admitting: Family

## 2019-12-03 ENCOUNTER — Encounter: Payer: Self-pay | Admitting: Family

## 2019-12-03 VITALS — BP 121/70 | HR 86 | Temp 96.8°F | Resp 16 | Ht 62.0 in | Wt 201.0 lb

## 2019-12-03 DIAGNOSIS — E785 Hyperlipidemia, unspecified: Secondary | ICD-10-CM

## 2019-12-03 DIAGNOSIS — I10 Essential (primary) hypertension: Secondary | ICD-10-CM | POA: Diagnosis not present

## 2019-12-03 DIAGNOSIS — F419 Anxiety disorder, unspecified: Secondary | ICD-10-CM | POA: Diagnosis not present

## 2019-12-03 DIAGNOSIS — Z0000 Encounter for general adult medical examination without abnormal findings: Secondary | ICD-10-CM

## 2019-12-03 LAB — HEPATIC FUNCTION PANEL
ALT: 22 U/L (ref 0–35)
AST: 18 U/L (ref 0–37)
Albumin: 4.3 g/dL (ref 3.5–5.2)
Alkaline Phosphatase: 80 U/L (ref 39–117)
Bilirubin, Direct: 0.1 mg/dL (ref 0.0–0.3)
Total Bilirubin: 0.9 mg/dL (ref 0.2–1.2)
Total Protein: 7.1 g/dL (ref 6.0–8.3)

## 2019-12-03 LAB — CBC WITH DIFFERENTIAL/PLATELET
Basophils Absolute: 0.1 10*3/uL (ref 0.0–0.1)
Basophils Relative: 0.8 % (ref 0.0–3.0)
Eosinophils Absolute: 0.2 10*3/uL (ref 0.0–0.7)
Eosinophils Relative: 2.2 % (ref 0.0–5.0)
HCT: 39.6 % (ref 36.0–46.0)
Hemoglobin: 13.7 g/dL (ref 12.0–15.0)
Lymphocytes Relative: 22.4 % (ref 12.0–46.0)
Lymphs Abs: 2.1 10*3/uL (ref 0.7–4.0)
MCHC: 34.5 g/dL (ref 30.0–36.0)
MCV: 90.6 fl (ref 78.0–100.0)
Monocytes Absolute: 0.7 10*3/uL (ref 0.1–1.0)
Monocytes Relative: 7.6 % (ref 3.0–12.0)
Neutro Abs: 6.2 10*3/uL (ref 1.4–7.7)
Neutrophils Relative %: 67 % (ref 43.0–77.0)
Platelets: 320 10*3/uL (ref 150.0–400.0)
RBC: 4.37 Mil/uL (ref 3.87–5.11)
RDW: 13.1 % (ref 11.5–15.5)
WBC: 9.3 10*3/uL (ref 4.0–10.5)

## 2019-12-03 LAB — LIPID PANEL
Cholesterol: 192 mg/dL (ref 0–200)
HDL: 35.8 mg/dL — ABNORMAL LOW (ref >39.00)
Total CHOL/HDL Ratio: 5
Triglycerides: 433 mg/dL — ABNORMAL HIGH (ref 0.0–149.0)

## 2019-12-03 LAB — BASIC METABOLIC PANEL
BUN: 20 mg/dL (ref 6–23)
CO2: 25 mEq/L (ref 19–32)
Calcium: 9.7 mg/dL (ref 8.4–10.5)
Chloride: 102 mEq/L (ref 96–112)
Creatinine, Ser: 0.99 mg/dL (ref 0.40–1.20)
GFR: 59.72 mL/min — ABNORMAL LOW (ref >60.00)
Glucose, Bld: 92 mg/dL (ref 70–99)
Potassium: 3.8 mEq/L (ref 3.5–5.1)
Sodium: 137 mEq/L (ref 135–145)

## 2019-12-03 LAB — TSH: TSH: 1.61 u[IU]/mL (ref 0.35–4.50)

## 2019-12-03 LAB — LDL CHOLESTEROL, DIRECT: Direct LDL: 82 mg/dL

## 2019-12-03 MED ORDER — ATORVASTATIN CALCIUM 20 MG PO TABS: 20.0000 mg | ORAL_TABLET | Freq: Every evening | ORAL | 1 refills | Status: DC

## 2019-12-03 MED ORDER — ESCITALOPRAM OXALATE 10 MG PO TABS: ORAL_TABLET | ORAL | 0 refills | Status: DC

## 2019-12-03 MED ORDER — FENOFIBRATE 145 MG PO TABS: 145.0000 mg | ORAL_TABLET | Freq: Every day | ORAL | 1 refills | Status: DC

## 2019-12-03 MED FILL — FENOFIBRATE 145 MG TABS: 145 | 30 days supply | Qty: 30 | Fill #0

## 2019-12-03 MED FILL — ESCITALOPRAM 10 MG TABLET: 10 | 30 days supply | Qty: 30 | Fill #0

## 2019-12-03 MED FILL — INDAPAMIDE 1.25 MG TABS: 1.25 | 30 days supply | Qty: 30 | Fill #2

## 2019-12-03 MED FILL — ATORVASTATIN 20 MG TABLET: 20 | 30 days supply | Qty: 30 | Fill #0

## 2019-12-03 NOTE — Patient Instructions (Signed)
Please complete lab work prior to leaving. Continue to work on [employer], exercise and weight loss. Start lexapro '10mg'$ , 1/2 tab once daily for 1 week, then increase to a full tab once daily on week two.    Preventive Care 26-49 Years Old, Female Preventive care refers to visits with your health care provider and lifestyle choices that can promote health and wellness. This includes:  A yearly physical exam. This may also be called an annual well check.  Regular dental visits and eye exams.  Immunizations.  Screening for certain conditions.  Healthy lifestyle choices, such as eating a healthy diet, getting regular exercise, not using drugs or products that contain nicotine and tobacco, and limiting alcohol use. What can I expect for my preventive care visit? Physical exam Your health care provider will check your:  Height and weight. This may be used to calculate body mass index (BMI), which tells if you are at a healthy weight.  Heart rate and blood pressure.  Skin for abnormal spots. Counseling Your health care provider may ask you questions about your:  Alcohol, tobacco, and drug use.  Emotional well-being.  Home and relationship well-being.  Sexual activity.  Eating habits.  Work and work Statistician.  Method of birth control.  Menstrual cycle.  Pregnancy history. What immunizations do I need?  Influenza (flu) vaccine  This is recommended every year. Tetanus, diphtheria, and pertussis (Tdap) vaccine  You may need a Td booster every 10 years. Varicella (chickenpox) vaccine  You may need this if you have not been vaccinated. Zoster (shingles) vaccine  You may need this after age 76. Measles, mumps, and rubella (MMR) vaccine  You may need at least one dose of MMR if you were born in 1957 or later. You may also need a second dose. Pneumococcal conjugate (PCV13) vaccine  You may need this if you have certain conditions and were not previously  vaccinated. Pneumococcal polysaccharide (PPSV23) vaccine  You may need one or two doses if you smoke cigarettes or if you have certain conditions. Meningococcal conjugate (MenACWY) vaccine  You may need this if you have certain conditions. Hepatitis A vaccine  You may need this if you have certain conditions or if you travel or work in places where you may be exposed to hepatitis A. Hepatitis B vaccine  You may need this if you have certain conditions or if you travel or work in places where you may be exposed to hepatitis B. Haemophilus influenzae type b (Hib) vaccine  You may need this if you have certain conditions. Human papillomavirus (HPV) vaccine  If recommended by your health care provider, you may need three doses over 6 months. You may receive vaccines as individual doses or as more than one vaccine together in one shot (combination vaccines). Talk with your health care provider about the risks and benefits of combination vaccines. What tests do I need? Blood tests  Lipid and cholesterol levels. These may be checked every 5 years, or more frequently if you are over 18 years old.  Hepatitis C test.  Hepatitis B test. Screening  Lung cancer screening. You may have this screening every year starting at age 62 if you have a 30-pack-year history of smoking and currently smoke or have quit within the past 15 years.  Colorectal cancer screening. All adults should have this screening starting at age 30 and continuing until age 28. Your health care provider may recommend screening at age 76 if you are at increased risk. You  will have tests every 1-10 years, depending on your results and the type of screening test.  Diabetes screening. This is done by checking your blood sugar (glucose) after you have not eaten for a while (fasting). You may have this done every 1-3 years.  Mammogram. This may be done every 1-2 years. Talk with your health care provider about when you should start  having regular mammograms. This may depend on whether you have a family history of breast cancer.  BRCA-related cancer screening. This may be done if you have a family history of breast, ovarian, tubal, or peritoneal cancers.  Pelvic exam and Pap test. This may be done every 3 years starting at age 21. Starting at age 30, this may be done every 5 years if you have a Pap test in combination with an HPV test. Other tests  Sexually transmitted disease (STD) testing.  Bone density scan. This is done to screen for osteoporosis. You may have this scan if you are at high risk for osteoporosis. Follow these instructions at home: Eating and drinking  Eat a diet that includes fresh fruits and vegetables, whole grains, lean protein, and low-fat dairy.  Take vitamin and mineral supplements as recommended by your health care provider.  Do not drink alcohol if: ? Your health care provider tells you not to drink. ? You are pregnant, may be pregnant, or are planning to become pregnant.  If you drink alcohol: ? Limit how much you have to 0-1 drink a day. ? Be aware of how much alcohol is in your drink. In the [place] one drink equals one 12 oz bottle of beer (355 mL), one 5 oz glass of wine (148 mL), or one 1 oz glass of hard liquor (44 mL). Lifestyle  Take daily care of your teeth and gums.  Stay active. Exercise for at least 30 minutes on 5 or more days each week.  Do not use any products that contain nicotine or tobacco, such as cigarettes, e-cigarettes, and chewing tobacco. If you need help quitting, ask your health care provider.  If you are sexually active, practice safe sex. Use a condom or other form of birth control (contraception) in order to prevent pregnancy and STIs (sexually transmitted infections).  If told by your health care provider, take low-dose aspirin daily starting at age 50. What's next?  Visit your health care provider once a year for a well check visit.  Ask your health  care provider how often you should have your eyes and teeth checked.  Stay up to date on all vaccines. This information is not intended to replace advice given to you by your health care provider. Make sure you discuss any questions you have with your health care provider. Document Revised: 06/06/2018 Document Reviewed: 06/06/2018 Elsevier Patient Education  2020 Elsevier Inc.  

## 2019-12-03 NOTE — Telephone Encounter (Signed)
Medical release form faxed to [facility]

## 2019-12-03 NOTE — Telephone Encounter (Signed)
Please call for mammogram at Dr. Tillie Rung Ross's office.

## 2019-12-03 NOTE — Progress Notes (Signed)
Subjective:    Patient ID: Jane Montes, female    DOB: 03/31/1971, 49 y.o.   MRN: [id number]  HPI   Patient is a 49 yr old female who presents today for follow up.  Immunizations: Completed covid series, tdap 2018 Flu shot up to date Diet: fair Wt Readings from Last 3 Encounters:  12/03/19 201 lb (91.2 kg)  11/21/19 203 lb 12.8 oz (92.4 kg)  11/15/18 199 lb 12.8 oz (90.6 kg)  Exercise: some exercise Pap Smear: 2014- s/p hysterectomy (she has 1 ovary on the right) Mammogram: 11/20 (Dr. Vanessa Kick) Vision: 9/20 Dental: 2/21  Mom was diagnosed with [id number]- mom has this. Has thrombocytosis.     Hyperlipidemia- maintained on atorvastatin 20 mg. Denies myalgia.   Lab Results  Component Value Date   CHOL 141 10/08/2018   HDL 45.40 10/08/2018   LDLCALC 66 10/08/2018   LDLDIRECT 105.0 04/02/2018   TRIG 146.0 10/08/2018   CHOLHDL 3 10/08/2018   HTN-  Not on antinhypertensive currently.  BP Readings from Last 3 Encounters:  12/03/19 121/70  11/21/19 (!) 130/100  11/15/18 (!) 144/77   Anxiety- plans to star working with a therapist.  Reports that she is having trouble concentrating.  Reports she has started having panic attacks.  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 Not sleeping well.  Sister diagnosed with cancer and mom with chronic blood disorder ( [id number]). She is separated from her husband.  Feels like she needs to do something at least short term to help her function better.   Review of Systems  Constitutional: Negative for unexpected weight change.  HENT: Negative for rhinorrhea.   Respiratory: Negative for cough and shortness of breath.   Cardiovascular: Negative for chest pain.  Gastrointestinal: Positive for nausea (mild nausea which she is attributing to stres). Negative for constipation and diarrhea.  Musculoskeletal: Negative for arthralgias and myalgias.  Skin: Negative for rash.       Dry skin  Hematological: Negative for  adenopathy.   Past Medical History:  Diagnosis Date  . Adenoma of right adrenal gland 10/12/2018   Noted on MRI Abd, measuring 2.2 x 2.4 x 2.7 cm  . Aortic atherosclerosis ([place]) 10/10/2018   Noted on [place]  . Fatty liver 10/12/2018   Noted on MRI Abd  . GERD (gastroesophageal reflux disease) 09/16/2015   Mod. noted on KUB  . Heart palpitations   . History of chest pain   . History of dizziness   . History of hiatal hernia 09/16/2015   Small, noted on KUB  . History of migraine   . Hydronephrosis of right kidney 10/12/2018   Mild, noted on MRI Abd  . Hyperlipidemia   . Hypertension    no longer on medication  . Left anterior fascicular block (LAFB) 01/10/2013   noted on EKG  . Left kidney mass 10/12/2018   Hyperechoic mass mid left kidney measuring 1.0 x 1.0 x 1.2 cm, noted on [place] AB  . Nephrolithiasis 10/12/2018   Right, Noted on MRI Abd  . Pelvic pain    Chronic, right lower quadrant  . Wears contact lenses   . Wears glasses      Social History   Socioeconomic History  . Marital status: Legally Separated    Spouse name: Not on file  . Number of children: Not on file  . Years of education: Not on file  . Highest education level: Not on file  Occupational History  . Occupation:  Nurse    Employer: [employer]  Tobacco Use  . Smoking status: Never Smoker  . Smokeless tobacco: Never Used  Substance and Sexual Activity  . Alcohol use: Yes    Comment: occ  . Drug use: No  . Sexual activity: Not Currently    Birth control/protection: Surgical  Other Topics Concern  . 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     Social Determinants of Health   Financial Resource Strain:   . Difficulty of Paying Living Expenses: Not on file  Food Insecurity:   . Worried About Charity fundraiser in the Last Year: Not on file  . Ran Out of Food in the Last  Year: Not on file  Transportation Needs:   . Lack of Transportation (Medical): Not on file  . Lack of Transportation (Non-Medical): Not on file  Physical Activity:   . Days of Exercise per Week: Not on file  . Minutes of Exercise per Session: Not on file  Stress:   . Feeling of Stress : Not on file  Social Connections:   . Frequency of Communication with Friends and Family: Not on file  . Frequency of Social Gatherings with Friends and Family: Not on file  . Attends Religious Services: Not on file  . Active Member of Clubs or Organizations: Not on file  . Attends Archivist Meetings: Not on file  . Marital Status: Not on file  Intimate Partner Violence:   . Fear of Current or Ex-Partner: Not on file  . Emotionally Abused: Not on file  . Physically Abused: Not on file  . Sexually Abused: Not on file    Past Surgical History:  Procedure Laterality Date  . ENDOMETRIAL ABLATION     Pelvic  . HOLMIUM LASER APPLICATION Right [id number]   Procedure: HOLMIUM LASER APPLICATION;  Surgeon: Festus Aloe, MD;  Location: [facility];  Service: Urology;  Laterality: Right;  . URETEROSCOPY WITH HOLMIUM LASER LITHOTRIPSY Right 11/15/2018   Procedure: CYSTOSCOPY, RETROGRADE, URETEROSCOPY WITH HOLMIUM LASER LITHOTRIPSY/STENT PLACEMENT;  Surgeon: Festus Aloe, MD;  Location[facility];  Service: Urology;  Laterality: Right;  Marland Kitchen VAGINAL HYSTERECTOMY  2007   Partial  . WISDOM TOOTH EXTRACTION      Family History  Problem Relation Age of Onset  . Diabetes Mother   . Heart attack Mother 88  . Heart disease Mother   . Hypertension Father   . COPD Father   . Bipolar disorder Father   . Parkinson's disease Father   . Cervical cancer Sister   . Breast cancer Sister   . Colon cancer Sister 36  . Lymphoma Sister   . Alcohol abuse Brother   . COPD Brother   . Hyperlipidemia Brother   . Hypertension Brother   . Stroke Brother   . COPD Maternal  Grandmother   . Hyperlipidemia Maternal Grandmother   . Heart attack Maternal Grandmother   . Depression Paternal Grandmother   . Heart disease Paternal Grandmother   . Heart attack Paternal Grandmother     Allergies  Allergen Reactions  . Cephalexin Nausea Only and Rash    Current Outpatient Medications on File Prior to Visit  Medication Sig Dispense Refill  . indapamide (LOZOL) 1.25 MG tablet Take 1.25 mg by mouth daily.     . Multiple Vitamins-Minerals (MULTIVITAMIN PO)  Take 1 tablet by mouth daily.     . Omega-3 Fatty Acids (FISH OIL) 1000 MG CAPS Take 2 capsules (2,000 mg total) by mouth 2 (two) times daily.  0   No current facility-administered medications on file prior to visit.    BP 121/70 (BP Location: Right Arm, Patient Position: Sitting, Cuff Size: Large)   Pulse 86   Temp (!) 96.8 F (36 C) (Temporal)   Resp 16   Ht 5\' 2"  (1.575 m)   Wt 201 lb (91.2 kg)   SpO2 99%   BMI 36.76 kg/m       Objective:   Physical Exam Physical Exam  Constitutional: She is oriented to person, place, and time. She appears well-developed and well-nourished. No distress.  HENT:  Head: Normocephalic and atraumatic.  Right Ear: Tympanic membrane and ear canal normal.  Left Ear: Tympanic membrane and ear canal normal.  Mouth/Throat: not examined- pt wearing mask Eyes: Pupils are equal, round, and reactive to light. No scleral icterus.  Neck: Normal range of motion. No thyromegaly present.  Cardiovascular: Normal rate and regular rhythm.   No murmur heard. Pulmonary/Chest: Effort normal and breath sounds normal. No respiratory distress. He has no wheezes. She has no rales. She exhibits no tenderness.  Abdominal: Soft. Bowel sounds are normal. She exhibits no distension and no mass. There is no tenderness. There is no rebound and no guarding.  Musculoskeletal: She exhibits no edema.  Lymphadenopathy:    She has no cervical adenopathy.  Neurological: She is alert and oriented to  person, place, and time. She has normal patellar reflexes. She exhibits normal muscle tone. Coordination normal.  Skin: Skin is warm and dry.  Psychiatric: She has a normal mood and affect. Her behavior is normal. Judgment and thought content normal.  Breast/pelvic: deferred           Assessment & Plan:   Preventative care- discussed healthy diet, exercise, weight loss.  She will let me know if her sister's colon cancer is confirmed later today, and if so we will plan to refer her for colonoscopy as well.  Mammo up to date, will request from GYN.  Immunizations reviewed and up to date. Obtain routine lab work.  Hyperlipidemia- obtain follow up lipid panel. Tolerating statin, fibrate.    Anxiety- new, uncontrolled. Will initiate lexapro 10mg .  I instructed pt to start 1/2 tablet once daily for 1 week and then increase to a full tablet once daily on week two as tolerated.  We discussed common side effects such as nausea, drowsiness and weight gain.  Also discussed rare but serious side effect of suicide ideation.  She is instructed to discontinue medication go directly to ED if this occurs.  Pt verbalizes understanding.  Plan follow up in 1 month to evaluate progress.    HTN- bp stable without medication today. Will continue to monitor.   This visit occurred during the SARS-CoV-2 public health emergency.  Safety protocols were in place, including screening questions prior to the visit, additional usage of staff PPE, and extensive cleaning of exam room while observing appropriate contact time as indicated for disinfecting solutions.           Assessment & Plan:

## 2019-12-04 ENCOUNTER — Encounter: Payer: Self-pay | Admitting: Family

## 2019-12-04 DIAGNOSIS — E785 Hyperlipidemia, unspecified: Secondary | ICD-10-CM | POA: Insufficient documentation

## 2019-12-04 DIAGNOSIS — E781 Pure hyperglyceridemia: Secondary | ICD-10-CM | POA: Insufficient documentation

## 2019-12-29 ENCOUNTER — Other Ambulatory Visit: Payer: Self-pay

## 2019-12-29 ENCOUNTER — Encounter: Payer: Self-pay | Admitting: Family

## 2019-12-29 ENCOUNTER — Ambulatory Visit (INDEPENDENT_AMBULATORY_CARE_PROVIDER_SITE_OTHER): Payer: 59 | Admitting: Family

## 2019-12-29 VITALS — Wt 196.0 lb

## 2019-12-29 DIAGNOSIS — E785 Hyperlipidemia, unspecified: Secondary | ICD-10-CM | POA: Diagnosis not present

## 2019-12-29 DIAGNOSIS — F419 Anxiety disorder, unspecified: Secondary | ICD-10-CM

## 2019-12-29 MED ORDER — DULOXETINE HCL 30 MG PO CPEP: ORAL_CAPSULE | ORAL | 1 refills | Status: DC

## 2019-12-29 MED ORDER — ATORVASTATIN CALCIUM 20 MG PO TABS: 20.0000 mg | ORAL_TABLET | Freq: Every day | ORAL | 1 refills | Status: DC

## 2019-12-29 MED FILL — ATORVASTATIN 20 MG TABLET: 20 | 30 days supply | Qty: 30 | Fill #0

## 2019-12-29 NOTE — Progress Notes (Signed)
Virtual Visit via Video Note  I connected with Jane Montes on 12/29/19 at  7:20 AM EDT by a video enabled telemedicine application and verified that I am speaking with the correct person using two identifiers.  Location: Patient: home Provider: home   I discussed the limitations of evaluation and management by telemedicine and the availability of in person appointments. The patient expressed understanding and agreed to proceed.  History of Present Illness:  Patient is a 49 yr old female who presents today for follow up of her anxiety. Last she described panic attacks, poor sleep, family stress. We added lexapro.  She notes no significant improvement in her anxiety. Still having panic attacks. Is bothered by the sexual side effects.    Past Medical History:  Diagnosis Date  . Adenoma of right adrenal gland 10/12/2018   Noted on MRI Abd, measuring 2.2 x 2.4 x 2.7 cm  . Aortic atherosclerosis ([place]) 10/10/2018   Noted on [place]  . Fatty liver 10/12/2018   Noted on MRI Abd  . GERD (gastroesophageal reflux disease) 09/16/2015   Mod. noted on KUB  . Heart palpitations   . History of chest pain   . History of dizziness   . History of hiatal hernia 09/16/2015   Small, noted on KUB  . History of migraine   . Hydronephrosis of right kidney 10/12/2018   Mild, noted on MRI Abd  . Hyperlipidemia   . Hypertension    no longer on medication  . Left anterior fascicular block (LAFB) 01/10/2013   noted on EKG  . Left kidney mass 10/12/2018   Hyperechoic mass mid left kidney measuring 1.0 x 1.0 x 1.2 cm, noted on [place] AB  . Nephrolithiasis 10/12/2018   Right, Noted on MRI Abd  . Pelvic pain    Chronic, right lower quadrant  . Wears contact lenses   . Wears glasses      Social History   Socioeconomic History  . Marital status: Legally Separated    Spouse name: Not on file  . Number of children: Not on file  . Years of education: Not on file  . Highest education level: Not on file   Occupational History  . Occupation: Nurse    Employer: [employer]  Tobacco Use  . Smoking status: Never Smoker  . Smokeless tobacco: Never Used  Substance and Sexual Activity  . Alcohol use: Yes    Comment: occ  . Drug use: No  . Sexual activity: Not Currently    Birth control/protection: Surgical  Other Topics Concern  . 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     Social Determinants of Health   Financial Resource Strain:   . Difficulty of Paying Living Expenses:   Food Insecurity:   . Worried About Charity fundraiser in the Last Year:   . Arboriculturist in the Last Year:   Transportation Needs:   . Film/video editor (Medical):   Marland Kitchen Lack of Transportation (Non-Medical):   Physical Activity:   . Days of Exercise per Week:   . Minutes of Exercise per Session:   Stress:   . Feeling of Stress :   Social Connections:   . Frequency of Communication with Friends and Family:   . Frequency of Social Gatherings with Friends and Family:   . Attends Religious Services:   .  Active Member of Clubs or Organizations:   . Attends Archivist Meetings:   Marland Kitchen Marital Status:   Intimate Partner Violence:   . Fear of Current or Ex-Partner:   . Emotionally Abused:   Marland Kitchen Physically Abused:   . Sexually Abused:     Past Surgical History:  Procedure Laterality Date  . ENDOMETRIAL ABLATION     Pelvic  . HOLMIUM LASER APPLICATION Right [id number]   Procedure: HOLMIUM LASER APPLICATION;  Surgeon: Festus Aloe, MD;  Location: [facility];  Service: Urology;  Laterality: Right;  . URETEROSCOPY WITH HOLMIUM LASER LITHOTRIPSY Right 11/15/2018   Procedure: CYSTOSCOPY, RETROGRADE, URETEROSCOPY WITH HOLMIUM LASER LITHOTRIPSY/STENT PLACEMENT;  Surgeon: Festus Aloe, MD;  Location: [facility];  Service: Urology;  Laterality:  Right;  Marland Kitchen VAGINAL HYSTERECTOMY  2007   Partial  . WISDOM TOOTH EXTRACTION      Family History  Problem Relation Age of Onset  . Diabetes Mother        Vickki Muff (recent diagnosis) causes thrombocytosis  . Heart attack Mother 77  . Heart disease Mother   . Hypertension Father   . COPD Father   . Bipolar disorder Father   . Parkinson's disease Father   . Cervical cancer Sister   . Breast cancer Sister   . Colon cancer Sister 33  . Lymphoma Sister   . Alcohol abuse Brother   . COPD Brother   . Hyperlipidemia Brother   . Hypertension Brother   . Stroke Brother   . COPD Maternal Grandmother   . Hyperlipidemia Maternal Grandmother   . Heart attack Maternal Grandmother   . Depression Paternal Grandmother   . Heart disease Paternal Grandmother   . Heart attack Paternal Grandmother     Allergies  Allergen Reactions  . Cephalexin Nausea Only and Rash    Current Outpatient Medications on File Prior to Visit  Medication Sig Dispense Refill  . escitalopram (LEXAPRO) 10 MG tablet 1/2 tab once daily for 1 week, then increase to a full tab once daily at bedtime 30 tablet 0  . fenofibrate (TRICOR) 145 MG tablet Take 1 tablet (145 mg total) by mouth daily. 90 tablet 1  . indapamide (LOZOL) 1.25 MG tablet Take 1.25 mg by mouth daily.     . Multiple Vitamins-Minerals (MULTIVITAMIN PO) Take 1 tablet by mouth daily.     . Omega-3 Fatty Acids (FISH OIL) 1000 MG CAPS Take 2 capsules (2,000 mg total) by mouth 2 (two) times daily.  0   No current facility-administered medications on file prior to visit.    Wt 196 lb (88.9 kg)   BMI 35.85 kg/m   Observations/Objective:   Gen: Awake, alert, no acute distress Resp: Breathing is even and non-labored Psych: calm/pleasant demeanor Neuro: Alert and Oriented x 3, + facial symmetry, speech is clear.   Assessment and Plan:  Anxiety- uncontrolled.  She has not yet scheduled an appointment with a counselor.  She plans to do this.     Hyperlipidemia- pt is reminded to schedule a follow up lab visit for repeat lipids.   Follow Up Instructions:    I discussed the assessment and treatment plan with the patient. The patient was provided an opportunity to ask questions and all were answered. The patient agreed with the plan and demonstrated an understanding of the instructions.   The patient was advised to call back or seek an in-person evaluation if the symptoms worsen or if the condition fails to improve as  anticipated.  Nance Pear, NP

## 2019-12-29 NOTE — Patient Instructions (Addendum)
Cut lexapro in half and take 1/2 tab once daily for 1 week. Begin cymbalta 30mg  once daily for 2 days, then increase to 60mg  once daily.

## 2019-12-30 ENCOUNTER — Telehealth: Payer: Self-pay

## 2019-12-30 MED FILL — INDAPAMIDE 1.25 MG TABS: 1.25 | 30 days supply | Qty: 30 | Fill #3

## 2019-12-30 NOTE — Telephone Encounter (Signed)
Patient called in to see if NP Jenetta Downer' [place] could send in a prescription for 1 cap by mouth once daily for 3 days, then increase to 2 caps once daily   Please send it to [place], [place] - [address] [facility].  [address] [place] [address]  Phone:  [phone] Fax:  [phone]

## 2019-12-31 ENCOUNTER — Telehealth: Payer: Self-pay

## 2019-12-31 MED ORDER — DULOXETINE HCL 30 MG PO CPEP: ORAL_CAPSULE | ORAL | 0 refills | Status: DC

## 2019-12-31 MED ORDER — DULOXETINE HCL 60 MG PO CPEP: ORAL_CAPSULE | ORAL | 1 refills | Status: DC

## 2019-12-31 MED FILL — DULoxetine HCL 60 MG CPEP: 60 | 30 days supply | Qty: 30 | Fill #0

## 2019-12-31 MED FILL — DULoxetine HCL 30 MG CPEP: 30 | 7 days supply | Qty: 7 | Fill #0

## 2019-12-31 NOTE — Telephone Encounter (Signed)
Patient advised of change 

## 2019-12-31 NOTE — Telephone Encounter (Signed)
I sent two separate rx's for 30mg  and 60mg . Please advise pt of this change.

## 2019-12-31 NOTE — Telephone Encounter (Signed)
Insurance will only pay for once a day Per pharmacist we can do 30 mg x7 days and another rx for 60 mg daily.  Please advise if ok to change

## 2020-01-01 MED FILL — FENOFIBRATE 145 MG TABS: 145 | 30 days supply | Qty: 30 | Fill #1

## 2020-01-19 ENCOUNTER — Other Ambulatory Visit (INDEPENDENT_AMBULATORY_CARE_PROVIDER_SITE_OTHER): Payer: 59

## 2020-01-19 ENCOUNTER — Other Ambulatory Visit: Payer: Self-pay

## 2020-01-19 DIAGNOSIS — E781 Pure hyperglyceridemia: Secondary | ICD-10-CM | POA: Diagnosis not present

## 2020-01-19 LAB — LIPID PANEL
Cholesterol: 137 mg/dL (ref 0–200)
HDL: 39.1 mg/dL (ref >39.00)
LDL Cholesterol: 68 mg/dL (ref 0–99)
NonHDL: 98.14
Total CHOL/HDL Ratio: 4
Triglycerides: 149 mg/dL (ref 0.0–149.0)
VLDL: 29.8 mg/dL (ref 0.0–40.0)

## 2020-01-23 ENCOUNTER — Other Ambulatory Visit: Payer: Self-pay

## 2020-01-26 ENCOUNTER — Other Ambulatory Visit: Payer: Self-pay

## 2020-01-26 ENCOUNTER — Encounter: Payer: Self-pay | Admitting: Family

## 2020-01-26 ENCOUNTER — Ambulatory Visit: Payer: 59 | Admitting: Family

## 2020-01-26 VITALS — BP 119/81 | HR 95 | Temp 96.5°F | Resp 16 | Ht 62.0 in | Wt 197.0 lb

## 2020-01-26 DIAGNOSIS — F329 Major depressive disorder, single episode, unspecified: Secondary | ICD-10-CM | POA: Diagnosis not present

## 2020-01-26 DIAGNOSIS — F32A Depression, unspecified: Secondary | ICD-10-CM

## 2020-01-26 MED ORDER — DULOXETINE HCL 60 MG PO CPEP: ORAL_CAPSULE | ORAL | 1 refills | Status: DC

## 2020-01-26 MED FILL — DULoxetine HCL 60 MG CPEP: 60 | 30 days supply | Qty: 30 | Fill #0

## 2020-01-26 NOTE — Progress Notes (Signed)
Subjective:    Patient ID: Jane Montes, female    DOB: 1971-03-18, 48 y.o.   MRN: [id number]  HPI  Patient is a 49 yr old female who presents today for follow up of her anxiety. Last visit she noted sexual side effects with lexapro.  We tapered off of lexapro and started cymbalta instead. Reports that she is feeling well on the cymbalta.  Mood is good. Has only had two small episodes of anxiety since last visit. Has not noted any side effects except for maybe dry mouth which doesn't really bother her.   Working with a Transport planner, Lanette Hampshire.   Last week her brother died unexpectedly from MI at age 81.  This is making her feel more motivated to take care of herself.    Review of Systems    see HPI  Past Medical History:  Diagnosis Date  . Adenoma of right adrenal gland 10/12/2018   Noted on MRI Abd, measuring 2.2 x 2.4 x 2.7 cm  . Aortic atherosclerosis ([place]) 10/10/2018   Noted on [place]  . Fatty liver 10/12/2018   Noted on MRI Abd  . GERD (gastroesophageal reflux disease) 09/16/2015   Mod. noted on KUB  . Heart palpitations   . History of chest pain   . History of dizziness   . History of hiatal hernia 09/16/2015   Small, noted on KUB  . History of migraine   . Hydronephrosis of right kidney 10/12/2018   Mild, noted on MRI Abd  . Hyperlipidemia   . Hypertension    no longer on medication  . Left anterior fascicular block (LAFB) 01/10/2013   noted on EKG  . Left kidney mass 10/12/2018   Hyperechoic mass mid left kidney measuring 1.0 x 1.0 x 1.2 cm, noted on [place] AB  . Nephrolithiasis 10/12/2018   Right, Noted on MRI Abd  . Pelvic pain    Chronic, right lower quadrant  . Wears contact lenses   . Wears glasses      Social History   Socioeconomic History  . Marital status: Legally Separated    Spouse name: Not on file  . Number of children: Not on file  . Years of education: Not on file  . Highest education level: Not on file  Occupational History  .  Occupation: Nurse    Employer: [employer]  Tobacco Use  . Smoking status: Never Smoker  . Smokeless tobacco: Never Used  Substance and Sexual Activity  . Alcohol use: Yes    Comment: occ  . Drug use: No  . Sexual activity: Not Currently    Birth control/protection: Surgical  Other Topics Concern  . 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     Social Determinants of Health   Financial Resource Strain:   . Difficulty of Paying Living Expenses:   Food Insecurity:   . Worried About Charity fundraiser in the Last Year:   . Arboriculturist in the Last Year:   Transportation Needs:   . Film/video editor (Medical):   Marland Kitchen Lack of Transportation (Non-Medical):   Physical Activity:   . Days of Exercise per Week:   . Minutes of Exercise per Session:   Stress:   . Feeling of Stress :   Social Connections:   . Frequency of Communication with Friends and Family:   .  Frequency of Social Gatherings with Friends and Family:   . Attends Religious Services:   . Active Member of Clubs or Organizations:   . Attends Archivist Meetings:   Marland Kitchen Marital Status:   Intimate Partner Violence:   . Fear of Current or Ex-Partner:   . Emotionally Abused:   Marland Kitchen Physically Abused:   . Sexually Abused:     Past Surgical History:  Procedure Laterality Date  . ENDOMETRIAL ABLATION     Pelvic  . HOLMIUM LASER APPLICATION Right [id number]   Procedure: HOLMIUM LASER APPLICATION;  Surgeon: Festus Aloe, MD;  Location: [facility];  Service: Urology;  Laterality: Right;  . URETEROSCOPY WITH HOLMIUM LASER LITHOTRIPSY Right 11/15/2018   Procedure: CYSTOSCOPY, RETROGRADE, URETEROSCOPY WITH HOLMIUM LASER LITHOTRIPSY/STENT PLACEMENT;  Surgeon: Festus Aloe, MD;  Location: [facility];  Service: Urology;  Laterality: Right;  Marland Kitchen VAGINAL  HYSTERECTOMY  2007   Partial  . WISDOM TOOTH EXTRACTION      Family History  Problem Relation Age of Onset  . Diabetes Mother        Vickki Muff (recent diagnosis) causes thrombocytosis  . Heart attack Mother 34  . Heart disease Mother   . Hypertension Father   . COPD Father   . Bipolar disorder Father   . Parkinson's disease Father   . Cervical cancer Sister   . Alcohol abuse Brother   . COPD Brother   . Hyperlipidemia Brother   . Hypertension Brother   . Stroke Brother   . Heart attack Brother   . COPD Maternal Grandmother   . Hyperlipidemia Maternal Grandmother   . Heart attack Maternal Grandmother   . Depression Paternal Grandmother   . Heart disease Paternal Grandmother   . Heart attack Paternal Grandmother     Allergies  Allergen Reactions  . Cephalexin Nausea Only and Rash    Current Outpatient Medications on File Prior to Visit  Medication Sig Dispense Refill  . atorvastatin (LIPITOR) 20 MG tablet Take 1 tablet (20 mg total) by mouth daily at 6 PM. 90 tablet 1  . fenofibrate (TRICOR) 145 MG tablet Take 1 tablet (145 mg total) by mouth daily. 90 tablet 1  . indapamide (LOZOL) 1.25 MG tablet Take 1.25 mg by mouth daily.     . Multiple Vitamins-Minerals (MULTIVITAMIN PO) Take 1 tablet by mouth daily.     . Omega-3 Fatty Acids (FISH OIL) 1000 MG CAPS Take 2 capsules (2,000 mg total) by mouth 2 (two) times daily.  0   No current facility-administered medications on file prior to visit.    BP 119/81 (BP Location: Right Arm, Patient Position: Sitting, Cuff Size: Large)   Pulse 95   Temp (!) 96.5 F (35.8 C) (Temporal)   Resp 16   Ht 5\' 2"  (1.575 m)   Wt 197 lb (89.4 kg)   SpO2 98%   BMI 36.03 kg/m    Objective:   Physical Exam Constitutional:      Appearance: She is well-developed.  Neck:     Thyroid: No thyromegaly.  Cardiovascular:     Rate and Rhythm: Normal rate and regular rhythm.     Heart sounds: Normal heart sounds. No murmur.    Pulmonary:     Effort: Pulmonary effort is normal. No respiratory distress.     Breath sounds: Normal breath sounds. No wheezing.  Musculoskeletal:     Cervical back: Neck supple.  Skin:    General: Skin is warm and dry.  Neurological:  Mental Status: She is alert and oriented to person, place, and time.  Psychiatric:        Behavior: Behavior normal.        Thought Content: Thought content normal.        Judgment: Judgment normal.           Assessment & Plan:  Anxiety- symptoms improved on cymbalta. No obvious side effects. Plan to continue cymbalta, counseling. She will reach out if she has any issues with mood.   This visit occurred during the SARS-CoV-2 public health emergency.  Safety protocols were in place, including screening questions prior to the visit, additional usage of staff PPE, and extensive cleaning of exam room while observing appropriate contact time as indicated for disinfecting solutions.

## 2020-01-26 NOTE — Patient Instructions (Signed)
Please continue current dose of cymbalta.

## 2020-01-29 MED FILL — FENOFIBRATE 145 MG TABS: 145 | 30 days supply | Qty: 30 | Fill #2

## 2020-01-29 MED FILL — INDAPAMIDE 1.25 MG TABLET: 1.25 | 30 days supply | Qty: 30 | Fill #4

## 2020-01-29 MED FILL — ATORVASTATIN 20 MG TABLET: 20 | 30 days supply | Qty: 30 | Fill #1

## 2020-02-13 ENCOUNTER — Ambulatory Visit: Payer: 59 | Admitting: Cardiovascular Disease

## 2020-02-13 ENCOUNTER — Encounter: Payer: Self-pay | Admitting: Cardiovascular Disease

## 2020-02-13 ENCOUNTER — Other Ambulatory Visit: Payer: Self-pay | Admitting: Cardiovascular Disease

## 2020-02-13 ENCOUNTER — Other Ambulatory Visit: Payer: Self-pay

## 2020-02-13 VITALS — BP 126/82 | HR 74 | Ht 62.0 in | Wt 197.0 lb

## 2020-02-13 DIAGNOSIS — R0789 Other chest pain: Secondary | ICD-10-CM | POA: Diagnosis not present

## 2020-02-13 MED ORDER — FENOFIBRATE 145 MG PO TABS: 145.0000 mg | ORAL_TABLET | Freq: Every day | ORAL | 1 refills | Status: DC

## 2020-02-13 NOTE — Progress Notes (Signed)
Jane Montes Date of Birth  04/09/1971       [facility]    [employer] [address], [place], [place]  [address]   [place], [place]  [address] [phone]     [phone]   Fax  [phone]    Fax [phone]  Problem List: 1. Palpitations 2. Hyperlipidemia 3. Dizziness    Previous notes.   Jane Montes is  a 49 year old female who presents today for followup of her palpitations and episodes of presyncope. She also is noted that her blood pressures been elevated. Is been under lots of stress recently. She's going through a separation. She's been exercising quite a bit and has been lifting a lot of weights. She is on a Paleo diet and has lost 28 pounds.  She's noticed lots of episodes of palpitations and lightheadedness. These primarily occur after she's been weight lifting. She lifts 280 pounds in a dead lift and this typically causes her to be dizzy and see "white spots".  She also has had some palpitations. These occurred at times are not necessarily related he any specific activity.  January 09, 2013:  She has continued to have dizziness and " white spots" in her vision.  She has been seen in the ER.   When she was on her way into the ER she had an  episode of CP / pressure.  Radiation to her right arm.  The pain was intermitant for hours.  Troponins were negtive, head CT was normal.  She has these episodes at work - she has hooked herself up to the monitor and has normal rhythm.  Her BP has also been normal during these episodes.    She broke her foot in January, 2014.  She had previously been doing lots of cardio exercise but has had to back off schedule.  She has continued to do her Cross-Fit workouts without chest pain.   We did a stress test years ago  Sept. 16, 2019:  [name] is seen back after a 5 year absence Has had some stress - going through a separatin and divorce.  Has heart burn. Has a small hiatal hernia    Has been stress eating ,  Has gained 25 lbs.    Lifts weights, spin classes,  Kick boxing  3-5 days a week   No CP with exercise.  Has had some vague CP ( occurred after an argument )  Pain was mid sternal ,   Slightly to the left  Lasted 30-40 min No CP or pressured with her work outs .   Mother had her first MI at age 66.     Feb 13, 2020:  Jane Montes is seen today for follow up of her HTN and hyperlipidemia  She has had kidney stones since her last visit Her triglyceride level has been as high as 737.  Her last triglyceride level on January 19, 2020 is 149. On lipitor, tricor, omega 3 fatty acids  We did a coronary CT angiogram in October, 2019.  She has a coronary calcium score of 0.  She had no plaque in any of her coronary arteries.   Her brother Mickie Hillier died of a MI at 66 Wt is 197 lbs.  No angina  Has had some anxiety issues - started on cymbalt  Is exercising .   Spin class, walking , zoomba   Current Outpatient Medications on File Prior to Visit  Medication Sig  Dispense Refill  . atorvastatin (LIPITOR) 20 MG tablet Take 1 tablet (20 mg total) by mouth daily at 6 PM. 90 tablet 1  . DULoxetine (CYMBALTA) 60 MG capsule 1 cap by mouth once daily (beginning on day 8) 90 capsule 1  . indapamide (LOZOL) 1.25 MG tablet Take 1.25 mg by mouth daily.     . Multiple Vitamins-Minerals (MULTIVITAMIN PO) Take 1 tablet by mouth daily.     . Omega-3 Fatty Acids (FISH OIL) 1000 MG CAPS Take 2 capsules (2,000 mg total) by mouth 2 (two) times daily.  0   No current facility-administered medications on file prior to visit.    Allergies  Allergen Reactions  . Cephalexin Nausea Only and Rash    Past Medical History:  Diagnosis Date  . Adenoma of right adrenal gland 10/12/2018   Noted on MRI Abd, measuring 2.2 x 2.4 x 2.7 cm  . Aortic atherosclerosis ([place]) 10/10/2018   Noted on [place]  . Fatty liver 10/12/2018   Noted on MRI Abd  . GERD (gastroesophageal reflux disease) 09/16/2015    Mod. noted on KUB  . Heart palpitations   . History of chest pain   . History of dizziness   . History of hiatal hernia 09/16/2015   Small, noted on KUB  . History of migraine   . Hydronephrosis of right kidney 10/12/2018   Mild, noted on MRI Abd  . Hyperlipidemia   . Hypertension    no longer on medication  . Left anterior fascicular block (LAFB) 01/10/2013   noted on EKG  . Left kidney mass 10/12/2018   Hyperechoic mass mid left kidney measuring 1.0 x 1.0 x 1.2 cm, noted on [place] AB  . Nephrolithiasis 10/12/2018   Right, Noted on MRI Abd  . Pelvic pain    Chronic, right lower quadrant  . Wears contact lenses   . Wears glasses     Past Surgical History:  Procedure Laterality Date  . ENDOMETRIAL ABLATION     Pelvic  . HOLMIUM LASER APPLICATION Right [id number]   Procedure: HOLMIUM LASER APPLICATION;  Surgeon: Festus Aloe, MD;  [facility];  Service: Urology;  Laterality: Right;  . URETEROSCOPY WITH HOLMIUM LASER LITHOTRIPSY Right 11/15/2018   Procedure: CYSTOSCOPY, RETROGRADE, URETEROSCOPY WITH HOLMIUM LASER LITHOTRIPSY/STENT PLACEMENT;  Surgeon: Festus Aloe, MD;  Location[facility];  Service: Urology;  Laterality: Right;  Marland Kitchen VAGINAL HYSTERECTOMY  2007   Partial  . WISDOM TOOTH EXTRACTION      Social History   Tobacco Use  Smoking Status Never Smoker  Smokeless Tobacco Never Used    Social History   Substance and Sexual Activity  Alcohol Use Yes   Comment: occ    Family History  Problem Relation Age of Onset  . Diabetes Mother        Vickki Muff (recent diagnosis) causes thrombocytosis  . Heart attack Mother 65  . Heart disease Mother   . Hypertension Father   . COPD Father   . Bipolar disorder Father   . Parkinson's disease Father   . Cervical cancer Sister   . Alcohol abuse Brother   . COPD Brother   . Hyperlipidemia Brother   . Hypertension Brother   . Stroke Brother   . Heart attack Brother   .  COPD Maternal Grandmother   . Hyperlipidemia Maternal Grandmother   . Heart attack Maternal Grandmother   . Depression Paternal Grandmother   . Heart disease Paternal Grandmother   . Heart  attack Paternal Grandmother     Reviw of Systems:  Reviewed in the HPI.  All other systems are negative.  Physical Exam: Blood pressure 126/82, pulse 74, height 5\' 2"  (1.575 m), weight 197 lb (89.4 kg), SpO2 97 %.  GEN:  Well nourished, well developed in no acute distress HEENT: Normal NECK: No JVD; No carotid bruits LYMPHATICS: No lymphadenopathy CARDIAC: RRR , no murmurs, rubs, gallops RESPIRATORY:  Clear to auscultation without rales, wheezing or rhonchi  ABDOMEN: Soft, non-tender, non-distended MUSCULOSKELETAL:  No edema; No deformity  SKIN: Warm and dry NEUROLOGIC:  Alert and oriented x 3    ECG:  Feb 13, 2020:   Normal sinus rhythm at 76.  No ST or T wave changes.    Assessment / Plan:   1.  Chest discomfort:   -  No further episodes of chest discomfort.  She had coronary CT angiogram which showed a coronary calcium score of 0.  She had no plaque in her coronary arteries.  She is working out on a regular basis and has not had any recurrent episodes of chest pain.   2.  Hyperlipidemia: Her lipids have been very well controlled.  She is lowered her triglyceride level from 737 down to the 140s.  She has improved her diet and is losing weight.   She is to continue her current course.  i'll see her in 1 year.      Mertie Moores, MD  02/17/2020 12:46 PM    [address],  [place], [place]  [address] Pager [phone] Phone: [phone]; Fax: [phone]

## 2020-02-13 NOTE — Patient Instructions (Signed)
Medication Instructions:  Your physician recommends that you continue on your current medications as directed. Please refer to the Current Medication list given to you today.  *If you need a refill on your cardiac medications before your next appointment, please call your pharmacy*   Follow-Up: At [facility], you and your health needs are our priority.  As part of our continuing mission to provide you with exceptional heart care, we have created designated Provider Care Teams.  These Care Teams include your primary Cardiologist (physician) and Advanced Practice Providers (APPs -  Physician Assistants and Nurse Practitioners) who all work together to provide you with the care you need, when you need it.  Your next appointment:   1 year(s)  The format for your next appointment:   In Person  Provider:   You may see Philip Nahser, MD or one of the following Advanced Practice Providers on your designated Care Team:   Scott Weaver, PA-C Vin Bhagat, PA-C   

## 2020-03-01 MED FILL — INDAPAMIDE 1.25 MG TABS: 1.25 | 30 days supply | Qty: 30 | Fill #5

## 2020-03-01 MED FILL — ATORVASTATIN 20 MG TABLET: 20 | 30 days supply | Qty: 30 | Fill #2

## 2020-03-01 MED FILL — FENOFIBRATE 145 MG TABS: 145 | 30 days supply | Qty: 30 | Fill #3

## 2020-03-01 MED FILL — DULoxetine HCL 60 MG CPEP: 60 | 30 days supply | Qty: 30 | Fill #1

## 2020-03-29 MED FILL — ATORVASTATIN 20 MG TABLET: 20 | 30 days supply | Qty: 30 | Fill #3

## 2020-03-29 MED FILL — INDAPAMIDE 1.25 MG TABS: 1.25 | 30 days supply | Qty: 30 | Fill #6

## 2020-03-29 MED FILL — DULoxetine HCL 60 MG CPEP: 60 | 30 days supply | Qty: 30 | Fill #2

## 2020-03-29 MED FILL — FENOFIBRATE 145 MG TABS: 145 | 30 days supply | Qty: 30 | Fill #4

## 2020-03-30 ENCOUNTER — Other Ambulatory Visit: Payer: Self-pay

## 2020-03-30 ENCOUNTER — Encounter: Payer: Self-pay | Admitting: Family

## 2020-03-30 ENCOUNTER — Telehealth (INDEPENDENT_AMBULATORY_CARE_PROVIDER_SITE_OTHER): Payer: 59 | Admitting: Family

## 2020-03-30 DIAGNOSIS — R490 Dysphonia: Secondary | ICD-10-CM

## 2020-03-30 NOTE — Progress Notes (Signed)
Virtual Visit via Video Note  I connected with Jane Montes on 03/30/20 at  7:00 AM EDT by a video enabled telemedicine application and verified that I am speaking with the correct person using two identifiers.  Location: Patient: car Provider: office   I discussed the limitations of evaluation and management by telemedicine and the availability of in person appointments. The patient expressed understanding and agreed to proceed. Only the patient and myself were present for today's video call.   History of Present Illness:    Patient is a 49 yr old female who presents today with chief complaint of voice hoarseness.  She reports that her symptoms began 3.5 weeks ago. She initially thought that her symptoms may be reflux related so she began taking protonix. She has not noted any improvement. Yesterday it started feeling like she had something "right in the middle of my throat" when she swallows. She denies any sore throat,  fever, runny nose or cough.  She does note some fatigue.  Observations/Objective:   Gen: Awake, alert, no acute distress ENT: + voice hoarseness Resp: Breathing is even and non-labored Psych: calm/pleasant demeanor Neuro: Alert and Oriented x 3, + facial symmetry, speech is clear.   Assessment and Plan:  Voice hoarseness- new. Will refer to [facility] for further evaluation.    Follow Up Instructions:    I discussed the assessment and treatment plan with the patient. The patient was provided an opportunity to ask questions and all were answered. The patient agreed with the plan and demonstrated an understanding of the instructions.   The patient was advised to call back or seek an in-person evaluation if the symptoms worsen or if the condition fails to improve as anticipated.  Nance Pear, NP

## 2020-04-13 ENCOUNTER — Encounter (INDEPENDENT_AMBULATORY_CARE_PROVIDER_SITE_OTHER): Payer: Self-pay | Admitting: Otolaryngology

## 2020-04-13 ENCOUNTER — Ambulatory Visit (INDEPENDENT_AMBULATORY_CARE_PROVIDER_SITE_OTHER): Payer: 59 | Admitting: Otolaryngology

## 2020-04-13 ENCOUNTER — Other Ambulatory Visit: Payer: Self-pay

## 2020-04-13 VITALS — Temp 97.5°F

## 2020-04-13 DIAGNOSIS — R490 Dysphonia: Secondary | ICD-10-CM

## 2020-04-13 MED FILL — predniSONE 10 MG TABS: 10 | 7 days supply | Qty: 27 | Fill #0

## 2020-04-13 NOTE — Progress Notes (Signed)
HPI: Jane Montes is a 49 y.o. female who presents is referred by her PCP for evaluation of hoarseness.  Apparently about 5 weeks ago patient was mowing her lawn and lost her voice shortly after this.  She apparently completely lost her voice for several weeks and just recently has it improved.  Patient has been taking reflux medication Prilosec 20 mg in the morning over the past few weeks.  Patient also states that when she swallows she feels like something is in the middle of her throat. Patient does not smoke. Patient denies a sore throat. Her voice is doing much better today although she has slight hoarseness with a mild raspiness to her voice..  Past Medical History:  Diagnosis Date  . Adenoma of right adrenal gland 10/12/2018   Noted on MRI Abd, measuring 2.2 x 2.4 x 2.7 cm  . Aortic atherosclerosis ([place]) 10/10/2018   Noted on [place]  . Fatty liver 10/12/2018   Noted on MRI Abd  . GERD (gastroesophageal reflux disease) 09/16/2015   Mod. noted on KUB  . Heart palpitations   . History of chest pain   . History of dizziness   . History of hiatal hernia 09/16/2015   Small, noted on KUB  . History of migraine   . Hydronephrosis of right kidney 10/12/2018   Mild, noted on MRI Abd  . Hyperlipidemia   . Hypertension    no longer on medication  . Left anterior fascicular block (LAFB) 01/10/2013   noted on EKG  . Left kidney mass 10/12/2018   Hyperechoic mass mid left kidney measuring 1.0 x 1.0 x 1.2 cm, noted on [place] AB  . Nephrolithiasis 10/12/2018   Right, Noted on MRI Abd  . Pelvic pain    Chronic, right lower quadrant  . Wears contact lenses   . Wears glasses    Past Surgical History:  Procedure Laterality Date  . ENDOMETRIAL ABLATION     Pelvic  . HOLMIUM LASER APPLICATION Right 10/14/1094   Procedure: HOLMIUM LASER APPLICATION;  Surgeon: Festus Aloe, MD;  Location: [facility];  Service: Urology;  Laterality: Right;  . URETEROSCOPY WITH HOLMIUM LASER  LITHOTRIPSY Right 11/15/2018   Procedure: CYSTOSCOPY, RETROGRADE, URETEROSCOPY WITH HOLMIUM LASER LITHOTRIPSY/STENT PLACEMENT;  Surgeon: Festus Aloe, MD;  Location[facility];  Service: Urology;  Laterality: Right;  Marland Kitchen VAGINAL HYSTERECTOMY  2007   Partial  . WISDOM TOOTH EXTRACTION     Social History   Socioeconomic History  . Marital status: Legally Separated    Spouse name: Not on file  . Number of children: Not on file  . Years of education: Not on file  . Highest education level: Not on file  Occupational History  . Occupation: Nurse    Employer: [employer]  Tobacco Use  . Smoking status: Never Smoker  . Smokeless tobacco: Never Used  Vaping Use  . Vaping Use: Never used  Substance and Sexual Activity  . Alcohol use: Yes    Comment: occ  . Drug use: No  . Sexual activity: Not Currently    Birth control/protection: Surgical  Other Topics Concern  . 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     Social Determinants of Health   Financial Resource Strain:   . Difficulty of Paying Living Expenses:   Food Insecurity:   . Worried  About Running Out of Food in the Last Year:   . [place] in the Last Year:   Transportation Needs:   . Lack of Transportation (Medical):   Marland Kitchen Lack of Transportation (Non-Medical):   Physical Activity:   . Days of Exercise per Week:   . Minutes of Exercise per Session:   Stress:   . Feeling of Stress :   Social Connections:   . Frequency of Communication with Friends and Family:   . Frequency of Social Gatherings with Friends and Family:   . Attends Religious Services:   . Active Member of Clubs or Organizations:   . Attends Archivist Meetings:   Marland Kitchen Marital Status:    Family History  Problem Relation Age of Onset  . Diabetes Mother        Vickki Muff (recent diagnosis) causes  thrombocytosis  . Heart attack Mother 53  . Heart disease Mother   . Hypertension Father   . COPD Father   . Bipolar disorder Father   . Parkinson's disease Father   . Cervical cancer Sister   . Alcohol abuse Brother   . COPD Brother   . Hyperlipidemia Brother   . Hypertension Brother   . Stroke Brother   . Heart attack Brother   . COPD Maternal Grandmother   . Hyperlipidemia Maternal Grandmother   . Heart attack Maternal Grandmother   . Depression Paternal Grandmother   . Heart disease Paternal Grandmother   . Heart attack Paternal Grandmother    Allergies  Allergen Reactions  . Cephalexin Nausea Only and Rash   Prior to Admission medications   Medication Sig Start Date End Date Taking? Authorizing Provider  atorvastatin (LIPITOR) 20 MG tablet Take 1 tablet (20 mg total) by mouth daily at 6 PM. 12/29/19  Yes Debbrah Alar, NP  DULoxetine (CYMBALTA) 60 MG capsule 1 cap by mouth once daily (beginning on day 8) 01/26/20  Yes Debbrah Alar, NP  fenofibrate (TRICOR) 145 MG tablet Take 1 tablet (145 mg total) by mouth daily. 02/13/20  Yes Nahser, Wonda Cheng, MD  indapamide (LOZOL) 1.25 MG tablet Take 1.25 mg by mouth daily.    Yes [provider]  Multiple Vitamins-Minerals (MULTIVITAMIN PO) Take 1 tablet by mouth daily.    Yes [provider]  Omega-3 Fatty Acids (FISH OIL) 1000 MG CAPS Take 2 capsules (2,000 mg total) by mouth 2 (two) times daily. 04/03/18  Yes Debbrah Alar, NP     Positive ROS: Otherwise negative  All other systems have been reviewed and were otherwise negative with the exception of those mentioned in the HPI and as above.  Physical Exam: Constitutional: Alert, well-appearing, no acute distress.  Minimal hoarseness in the office today. Ears: External ears without lesions or tenderness. Ear canals are clear bilaterally with intact, clear TMs.  Nasal: External nose without lesions. Septum midline with mild rhinitis. Clear nasal  passages with no signs of infection. Oral: Lips and gums without lesions. Tongue and palate mucosa without lesions. Posterior oropharynx clear.  Indirect laryngoscopy was difficult because of strong gag reflex but base of tongue and hypopharynx was clear. Fiberoptic laryngoscopy was performed through the right nostril.  The nasopharynx was clear.  The base of tongue vallecula and epiglottis were normal.  She had mild edema of the arytenoid mucosa which may be indicative of reflux.  On examination of the vocal cords she had slightly diminished movement in the left true vocal cord compared to the right but no  specific vocal cord lesions noted. Neck: No palpable adenopathy or masses.  No palpable adenopathy in the neck.  No palpable thyroid nodules. Respiratory: Breathing comfortably  Skin: No facial/neck lesions or rash noted.  Laryngoscopy  Date/Time: 04/13/2020 6:36 PM Performed by: Rozetta Nunnery, MD Authorized by: Rozetta Nunnery, MD   Consent:    Consent obtained:  Verbal   Consent given by:  Patient Procedure details:    Indications: hoarseness, dysphagia, or aspiration     Medication:  Afrin   Instrument: flexible fiberoptic laryngoscope     Scope location: right nare   Sinus:    Right nasopharynx: normal   Mouth:    Vallecula: normal     Base of tongue: normal     Epiglottis: normal   Throat:    True vocal cords: normal   Comments:     On fiberoptic laryngoscopy patient has some slight weakness of the left true vocal cord on movement.  She apparently did have kind of a breathy voice and she may have had left vocal cord weakness that is gradually improving.    Assessment: Hoarseness with slight left true vocal cord weakness.  Plan: Placed her on a steroid taper for 7 days starting with prednisone 60 mg x 2 days and then tapering over the ensuing 5 days. She will continue with her use of Prilosec 20 mg and recommended taking it twice daily for the next couple  weeks.  She will follow-up in 2 to 3 weeks for recheck.   Radene Journey, MD   CC:

## 2020-04-27 ENCOUNTER — Ambulatory Visit (INDEPENDENT_AMBULATORY_CARE_PROVIDER_SITE_OTHER): Payer: 59 | Admitting: Otolaryngology

## 2020-04-27 ENCOUNTER — Encounter (INDEPENDENT_AMBULATORY_CARE_PROVIDER_SITE_OTHER): Payer: Self-pay | Admitting: Otolaryngology

## 2020-04-27 ENCOUNTER — Other Ambulatory Visit: Payer: Self-pay

## 2020-04-27 VITALS — Temp 97.2°F

## 2020-04-27 DIAGNOSIS — K219 Gastro-esophageal reflux disease without esophagitis: Secondary | ICD-10-CM

## 2020-04-27 NOTE — Progress Notes (Signed)
HPI: Jane Montes is a 49 y.o. female who returns today for evaluation of hoarseness and left vocal cord weakness.  Patient was seen 2 weeks ago and on fiberoptic laryngoscopy at that time she had what appeared to be slight left vocal cord weakness as well as laryngeal pharyngeal reflux.  She was instructed to take her Protonix 20 mg twice daily and also placed on a steroid Dosepak and she presents today for follow-up. Her voice is doing much better and she has a good voice in the office today..  Past Medical History:  Diagnosis Date  . Adenoma of right adrenal gland 10/12/2018   Noted on MRI Abd, measuring 2.2 x 2.4 x 2.7 cm  . Aortic atherosclerosis ([place]) 10/10/2018   Noted on [place]  . Fatty liver 10/12/2018   Noted on MRI Abd  . GERD (gastroesophageal reflux disease) 09/16/2015   Mod. noted on KUB  . Heart palpitations   . History of chest pain   . History of dizziness   . History of hiatal hernia 09/16/2015   Small, noted on KUB  . History of migraine   . Hydronephrosis of right kidney 10/12/2018   Mild, noted on MRI Abd  . Hyperlipidemia   . Hypertension    no longer on medication  . Left anterior fascicular block (LAFB) 01/10/2013   noted on EKG  . Left kidney mass 10/12/2018   Hyperechoic mass mid left kidney measuring 1.0 x 1.0 x 1.2 cm, noted on [place] AB  . Nephrolithiasis 10/12/2018   Right, Noted on MRI Abd  . Pelvic pain    Chronic, right lower quadrant  . Wears contact lenses   . Wears glasses    Past Surgical History:  Procedure Laterality Date  . ENDOMETRIAL ABLATION     Pelvic  . HOLMIUM LASER APPLICATION Right 0/11/5850   Procedure: HOLMIUM LASER APPLICATION;  Surgeon: Festus Aloe, MD;  Location: [facility];  Service: Urology;  Laterality: Right;  . URETEROSCOPY WITH HOLMIUM LASER LITHOTRIPSY Right 11/15/2018   Procedure: CYSTOSCOPY, RETROGRADE, URETEROSCOPY WITH HOLMIUM LASER LITHOTRIPSY/STENT PLACEMENT;  Surgeon: Festus Aloe, MD;   Location: [facility];  Service: Urology;  Laterality: Right;  Marland Kitchen VAGINAL HYSTERECTOMY  2007   Partial  . WISDOM TOOTH EXTRACTION     Social History   Socioeconomic History  . Marital status: Legally Separated    Spouse name: Not on file  . Number of children: Not on file  . Years of education: Not on file  . Highest education level: Not on file  Occupational History  . Occupation: Nurse    Employer:   Tobacco Use  . Smoking status: Never Smoker  . Smokeless tobacco: Never Used  Vaping Use  . Vaping Use: Never used  Substance and Sexual Activity  . Alcohol use: Yes    Comment: occ  . Drug use: No  . Sexual activity: Not Currently    Birth control/protection: Surgical  Other Topics Concern  . 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     Social Determinants of Health   Financial Resource Strain:   . Difficulty of Paying Living Expenses:   Food Insecurity:   . Worried About Charity fundraiser in the Last Year:   . Arboriculturist in the Last Year:   Transportation Needs:   .  Lack of Transportation (Medical):   Marland Kitchen Lack of Transportation (Non-Medical):   Physical Activity:   . Days of Exercise per Week:   . Minutes of Exercise per Session:   Stress:   . Feeling of Stress :   Social Connections:   . Frequency of Communication with Friends and Family:   . Frequency of Social Gatherings with Friends and Family:   . Attends Religious Services:   . Active Member of Clubs or Organizations:   . Attends Archivist Meetings:   Marland Kitchen Marital Status:    Family History  Problem Relation Age of Onset  . Diabetes Mother        Vickki Muff (recent diagnosis) causes thrombocytosis  . Heart attack Mother 17  . Heart disease Mother   . Hypertension Father   . COPD Father   . Bipolar disorder Father   . Parkinson's  disease Father   . Cervical cancer Sister   . Alcohol abuse Brother   . COPD Brother   . Hyperlipidemia Brother   . Hypertension Brother   . Stroke Brother   . Heart attack Brother   . COPD Maternal Grandmother   . Hyperlipidemia Maternal Grandmother   . Heart attack Maternal Grandmother   . Depression Paternal Grandmother   . Heart disease Paternal Grandmother   . Heart attack Paternal Grandmother    Allergies  Allergen Reactions  . Cephalexin Nausea Only and Rash   Prior to Admission medications   Medication Sig Start Date End Date Taking? Authorizing Provider  atorvastatin (LIPITOR) 20 MG tablet Take 1 tablet (20 mg total) by mouth daily at 6 PM. 12/29/19  Yes Debbrah Alar, NP  DULoxetine (CYMBALTA) 60 MG capsule 1 cap by mouth once daily (beginning on day 8) 01/26/20  Yes Debbrah Alar, NP  fenofibrate (TRICOR) 145 MG tablet Take 1 tablet (145 mg total) by mouth daily. 02/13/20  Yes Nahser, Wonda Cheng, MD  indapamide (LOZOL) 1.25 MG tablet Take 1.25 mg by mouth daily.    Yes [provider]  Multiple Vitamins-Minerals (MULTIVITAMIN PO) Take 1 tablet by mouth daily.    Yes [provider]  Omega-3 Fatty Acids (FISH OIL) 1000 MG CAPS Take 2 capsules (2,000 mg total) by mouth 2 (two) times daily. 04/03/18  Yes Debbrah Alar, NP     Positive ROS: Otherwise negative  All other systems have been reviewed and were otherwise negative with the exception of those mentioned in the HPI and as above.  Physical Exam: Constitutional: Alert, well-appearing, no acute distress Ears: External ears without lesions or tenderness. Ear canals are clear bilaterally with intact, clear TMs.  Nasal: External nose without lesions. Septum deviated to the left.. Clear nasal passages Oral: Lips and gums without lesions. Tongue and palate mucosa without lesions. Posterior oropharynx clear. Fiberoptic laryngoscopy was performed through the right nostril.  The nasopharynx was  clear.  Base of tongue was clear.  Vocal cords were clear bilaterally with normal vocal cord mobility.  She did have moderate supraglottic edema as well as arytenoid edema consistent with laryngeal pharyngeal reflux.  But no specific vocal cord lesions with normal vocal cord mobility. Neck: No palpable adenopathy or masses Respiratory: Breathing comfortably  Skin: No facial/neck lesions or rash noted.  Laryngoscopy  Date/Time: 04/27/2020 5:30 PM Performed by: Rozetta Nunnery, MD Authorized by: Rozetta Nunnery, MD   Consent:    Consent obtained:  Verbal   Consent given by:  Patient Procedure details:    Indications: hoarseness,  dysphagia, or aspiration     Medication:  Afrin   Instrument: flexible fiberoptic laryngoscope     Scope location: right nare   Mouth:    Oropharynx: normal     Vallecula: normal     Base of tongue: normal     Epiglottis: normal   Throat:    True vocal cords: normal   Comments:     Vocal cords were clear bilaterally with normal vocal cord mobility.  She had moderate arytenoid edema and mild erythema consistent with probable laryngeal pharyngeal reflux.    Assessment: Normal vocal cord exam in the office today. Symptoms most likely related to laryngeal pharyngeal reflux.  Plan: Patient is on Protonix 20 mg twice daily. Recommended elevation of the head of bed at night and modification of diet should help improve reflux. She will follow-up as needed.   Radene Journey, MD

## 2020-05-02 ENCOUNTER — Emergency Department (HOSPITAL_COMMUNITY): Payer: 59

## 2020-05-02 ENCOUNTER — Emergency Department (HOSPITAL_COMMUNITY)
Admission: EM | Admit: 2020-05-02 | Discharge: 2020-05-02 | Disposition: A | Discharge status: Home / Self Care | Payer: 59 | Attending: Emergency Medicine | Admitting: Emergency Medicine

## 2020-05-02 ENCOUNTER — Encounter (HOSPITAL_COMMUNITY): Payer: Self-pay | Admitting: Emergency Medicine

## 2020-05-02 ENCOUNTER — Other Ambulatory Visit: Payer: Self-pay

## 2020-05-02 DIAGNOSIS — U071 COVID-19: Secondary | ICD-10-CM | POA: Diagnosis not present

## 2020-05-02 DIAGNOSIS — I10 Essential (primary) hypertension: Secondary | ICD-10-CM | POA: Diagnosis not present

## 2020-05-02 DIAGNOSIS — R079 Chest pain, unspecified: Secondary | ICD-10-CM | POA: Insufficient documentation

## 2020-05-02 HISTORY — DX: Pure hypercholesterolemia, unspecified: E78.00

## 2020-05-02 LAB — FERRITIN: Ferritin: 236 ng/mL (ref 11–307)

## 2020-05-02 LAB — CBC
HCT: 45.4 % (ref 36.0–46.0)
Hemoglobin: 14.8 g/dL (ref 12.0–15.0)
MCH: 29.8 pg (ref 26.0–34.0)
MCHC: 32.6 g/dL (ref 30.0–36.0)
MCV: 91.5 fL (ref 80.0–100.0)
Platelets: 433 10*3/uL — ABNORMAL HIGH (ref 150–400)
RBC: 4.96 MIL/uL (ref 3.87–5.11)
RDW: 12.5 % (ref 11.5–15.5)
WBC: 9.7 10*3/uL (ref 4.0–10.5)
nRBC: 0 % (ref 0.0–0.2)

## 2020-05-02 LAB — BASIC METABOLIC PANEL
Anion gap: 12 (ref 5–15)
BUN: 20 mg/dL (ref 6–20)
CO2: 23 mmol/L (ref 22–32)
Calcium: 10 mg/dL (ref 8.9–10.3)
Chloride: 104 mmol/L (ref 98–111)
Creatinine, Ser: 1.28 mg/dL — ABNORMAL HIGH (ref 0.44–1.00)
GFR calc Af Amer: 57 mL/min — ABNORMAL LOW (ref >60)
GFR calc non Af Amer: 49 mL/min — ABNORMAL LOW (ref >60)
Glucose, Bld: 100 mg/dL — ABNORMAL HIGH (ref 70–99)
Potassium: 4 mmol/L (ref 3.5–5.1)
Sodium: 139 mmol/L (ref 135–145)

## 2020-05-02 LAB — HEPATIC FUNCTION PANEL
ALT: 29 U/L (ref 0–44)
AST: 24 U/L (ref 15–41)
Albumin: 4.1 g/dL (ref 3.5–5.0)
Alkaline Phosphatase: 55 U/L (ref 38–126)
Bilirubin, Direct: 0.1 mg/dL (ref 0.0–0.2)
Indirect Bilirubin: 0.7 mg/dL (ref 0.3–0.9)
Total Bilirubin: 0.8 mg/dL (ref 0.3–1.2)
Total Protein: 7.5 g/dL (ref 6.5–8.1)

## 2020-05-02 LAB — LACTIC ACID, PLASMA: Lactic Acid, Venous: 1.5 mmol/L (ref 0.5–1.9)

## 2020-05-02 LAB — TROPONIN I (HIGH SENSITIVITY)
Troponin I (High Sensitivity): 5 ng/L (ref <18)
Troponin I (High Sensitivity): 5 ng/L (ref <18)

## 2020-05-02 LAB — TRIGLYCERIDES: Triglycerides: 229 mg/dL — ABNORMAL HIGH (ref <150)

## 2020-05-02 LAB — I-STAT BETA HCG BLOOD, ED (MC, WL, AP ONLY): I-stat hCG, quantitative: <5 m[IU]/mL (ref <5)

## 2020-05-02 LAB — PROCALCITONIN: Procalcitonin: <0.1 ng/mL

## 2020-05-02 LAB — LACTATE DEHYDROGENASE: LDH: 161 U/L (ref 98–192)

## 2020-05-02 LAB — C-REACTIVE PROTEIN: CRP: 0.7 mg/dL (ref <1.0)

## 2020-05-02 LAB — D-DIMER, QUANTITATIVE: D-Dimer, Quant: 0.59 ug/mL-FEU — ABNORMAL HIGH (ref 0.00–0.50)

## 2020-05-02 MED ORDER — IOHEXOL 350 MG/ML SOLN
75.0000 mL | Freq: Once | INTRAVENOUS | Status: AC | PRN
  Administered 2020-05-02: 75 mL via INTRAVENOUS

## 2020-05-02 MED ORDER — DIPHENHYDRAMINE HCL 50 MG/ML IJ SOLN: 50.0000 mg | Freq: Once | INTRAMUSCULAR | Status: DC | PRN

## 2020-05-02 MED ORDER — SODIUM CHLORIDE 0.9% FLUSH: 3.0000 mL | Freq: Once | INTRAVENOUS | Status: DC

## 2020-05-02 MED ORDER — METHYLPREDNISOLONE SODIUM SUCC 125 MG IJ SOLR: 125.0000 mg | Freq: Once | INTRAMUSCULAR | Status: DC | PRN

## 2020-05-02 MED ORDER — ALBUTEROL SULFATE HFA 108 (90 BASE) MCG/ACT IN AERS: 2.0000 | INHALATION_SPRAY | Freq: Once | RESPIRATORY_TRACT | Status: DC | PRN

## 2020-05-02 MED ORDER — EPINEPHRINE 0.3 MG/0.3ML IJ SOAJ: 0.3000 mg | Freq: Once | INTRAMUSCULAR | Status: DC | PRN

## 2020-05-02 MED ORDER — SODIUM CHLORIDE 0.9 % IV SOLN: INTRAVENOUS | Status: DC | PRN

## 2020-05-02 MED ORDER — SODIUM CHLORIDE 0.9 % IV SOLN
Freq: Once | INTRAVENOUS | Status: AC
  Filled 2020-05-02: qty 5

## 2020-05-02 MED ORDER — LACTATED RINGERS IV BOLUS
1000.0000 mL | Freq: Once | INTRAVENOUS | Status: AC
  Administered 2020-05-02: 1000 mL via INTRAVENOUS

## 2020-05-02 MED ORDER — ONDANSETRON 4 MG PO TBDP
4.0000 mg | ORAL_TABLET | Freq: Three times a day (TID) | ORAL | 0 refills | Status: DC | PRN
Start: 2020-05-02 — End: 2020-08-10

## 2020-05-02 MED ORDER — FAMOTIDINE IN NACL 20-0.9 MG/50ML-% IV SOLN: 20.0000 mg | Freq: Once | INTRAVENOUS | Status: DC | PRN

## 2020-05-02 MED ORDER — SODIUM CHLORIDE 0.9 % IV BOLUS
500.0000 mL | Freq: Once | INTRAVENOUS | Status: DC
  Administered 2020-05-02: 500 mL via INTRAVENOUS

## 2020-05-02 NOTE — ED Notes (Signed)
Infusion stopped, pt has denies any complaints of any allergic reaction symptoms.

## 2020-05-02 NOTE — ED Triage Notes (Signed)
Pt states she was diagnosed with COVID on Wednesday.  Reports increased SOB since yesterday. Reports chest pain, dizziness, and feeling like she is going to pass out today.

## 2020-05-02 NOTE — Progress Notes (Signed)
[facility]  Feeling better after IV fluid administration HR improved O2 saturation remains 100% on RA Looks better on exam CT angiogram images reviewed, no PE, no pulmonary parenchymal infiltrate Labs: normal inflammatory markers  She is high risk for complication for OFBPZ-02 as she is overweight and has hypertension.  She currently has mild symptoms of COVID-19 and does not require hospitalization.  She meets FDA EUA criteria for a monoclonal antibody infusion of REGEN-COV.  I spoke to her about the risks and benefits of the medication and the nature of the infusion.  She consents (and gave written consent) for monoclonal antibody infusion.  Discussed with ED physician, bedside RN and pharmacist staffing ED today.  Order placed.  Can d/c home after infusion  Instructions: OK to use APAP prn at home Mucinex for chest congestion Increase po intake especially fluids Would return if worsening dyspnea It is a good idea to buy an O2 saturation probe to use at home over the next few days  Call if questions.  Roselie Awkward, MD [facility] Pager: [phone] Cell: [phone] If no response, call [phone]

## 2020-05-02 NOTE — Consult Note (Signed)
NAME:  Jane Montes, MRN:  [id number], DOB:  Jan 04, 1971, LOS: 0 ADMISSION DATE:  05/02/2020, CONSULTATION DATE: May 02, 2020 REFERRING MD: Dr. Francia Greaves, CHIEF COMPLAINT: Dizziness  Brief History   49 year old female presented to the emergency room on May 02, 2020 complaining of dizziness, cough and dyspnea 5 days after a diagnosis of COVID-19.  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  She says at baseline she has a past medical history significant for hyperlipidemia for which she takes medications, kidney stones, and an adrenal adenoma.  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  She found out later that a number of people from the gathering became positive for COVID-19.  On July 20 she developed some mild fatigue and overnight she developed a low-grade fever.  She went to employee health where she had a SARS-CoV-2 test which returned positive.  Throughout the course of the week she has felt anorexia, some nausea, fatigue, malaise, fever, and cough with chest congestion.  In the last 24 hours she has developed shortness of breath with activity.  Specifically she said she had difficulty walking from her bedroom to the bathroom.  She has some chest tightness associated with this.  She presented to the emergency room today because of progressive shortness of breath and fatigue.  Here in the emergency room she has had a chest x-ray which is clear, lab work which shows evidence of mild renal insufficiency, and an elevated D-dimer.  Of note this is a courtesy consult for which the patient consents.   Past Medical History  Hypertension Hyperlipidemia Nephrolithiasis Right adrenal gland  adenoma Hydronephrosis of right kidney Migraine GERD Aortic atherosclerosis  Significant Hospital Events     Consults:  PCCM  Procedures:   Significant Diagnostic Tests:  7/25 CT angiogram chest >   Micro Data:    Antimicrobials:     Interim history/subjective:    Objective   Blood pressure (!) 137/99, pulse (!) 111, temperature 99.6 F (37.6 C), temperature source Oral, resp. rate 18, SpO2 99 %.       No intake or output data in the 24 hours ending 05/02/20 1423 There were no vitals filed for this visit.  Examination: General:  Anxious, resting comfortably in bed HENT: NCAT OP clear PULM: CTA B, normal effort CV: RRR, no mgr GI: BS+, soft, nontender MSK: normal bulk and tone Neuro: awake, alert, no distress, MAEW   Resolved Hospital Problem list     Assessment & Plan:  Dizziness shortness of breath and chest congestion due to COVID-19 infection: Fortunately at this time she seems to have mild symptoms.  Her lab work reflects mild acute kidney insufficiency related to poor p.o. intake over the last few days.  Given the abrupt change in chest tightness and dyspnea yesterday after recent travel I think it is reasonable to look for a pulmonary embolism with a CT angiogram. -Agree with CT angiogram -I will order 1 L of lactated Ringer's -Agree with CRP, ferritin, LDH -If CT angiogram does not show anything that would merit hospitalization I think she should receive a monoclonal antibody against COVID-19, I can help facilitate this  Best practice:  Labs   CBC: Recent Labs  Lab 05/02/20 1205  WBC 9.7  HGB 14.8  HCT 45.4  MCV 91.5  PLT 433*    Basic Metabolic Panel: Recent Labs  Lab 05/02/20 1205  NA 139  K 4.0  CL 104  CO2 23  GLUCOSE 100*  BUN 20  CREATININE 1.28*  CALCIUM 10.0   GFR: CrCl cannot be calculated (Unknown ideal weight.). Recent Labs  Lab 05/02/20 1205  WBC 9.7    Liver Function Tests: No results for input(s): AST,  ALT, ALKPHOS, BILITOT, PROT, ALBUMIN in the last 168 hours. No results for input(s): LIPASE, AMYLASE in the last 168 hours. No results for input(s): AMMONIA in the last 168 hours.  ABG No results found for: PHART, PCO2ART, PO2ART, HCO3, TCO2, ACIDBASEDEF, O2SAT   Coagulation Profile: No results for input(s): INR, PROTIME in the last 168 hours.  Cardiac Enzymes: No results for input(s): CKTOTAL, CKMB, CKMBINDEX, TROPONINI in the last 168 hours.  HbA1C: No results found for: HGBA1C  CBG: No results for input(s): GLUCAP in the last 168 hours.  Review of Systems:   Gen: per HPI HEENT: Denies blurred vision, double vision, hearing loss, tinnitus, sinus congestion, rhinorrhea, sore throat, neck stiffness, dysphagia PULM: per HPI CV: Denies chest pain, edema, orthopnea, paroxysmal nocturnal dyspnea, palpitations, + chest tightness GI: Denies abdominal pain, nausea, vomiting, diarrhea, hematochezia, melena, constipation, change in bowel habits GU: Denies dysuria, hematuria, polyuria, oliguria, urethral discharge Endocrine: Denies hot or cold intolerance, polyuria, polyphagia or appetite change Derm: Denies rash, dry skin, scaling or peeling skin change Heme: Denies easy bruising, bleeding, bleeding gums Neuro: Denies headache, numbness, weakness, slurred speech, loss of memory or consciousness   Past Medical History  She,  has a past medical history of Adenoma of right adrenal gland (10/12/2018), Aortic atherosclerosis ([place]) (10/10/2018), Fatty liver (10/12/2018), GERD (gastroesophageal reflux disease) (09/16/2015), Heart palpitations, High cholesterol, History of chest pain, History of dizziness, History of hiatal hernia (09/16/2015), History of migraine, Hydronephrosis of right kidney (10/12/2018), Hyperlipidemia, Hypertension, Left anterior fascicular block (LAFB) (01/10/2013), Left kidney mass (10/12/2018), Nephrolithiasis (10/12/2018), Pelvic pain, Wears contact lenses, and Wears  glasses.   Surgical History    Past Surgical History:  Procedure Laterality Date  . ABDOMINAL HYSTERECTOMY    . ENDOMETRIAL ABLATION     Pelvic  . HOLMIUM LASER APPLICATION Right 03/11/7857   Procedure: HOLMIUM LASER APPLICATION;  Surgeon: Festus Aloe, MD;  Location: [facility];  Service: Urology;  Laterality: Right;  . URETEROSCOPY WITH HOLMIUM LASER LITHOTRIPSY Right 11/15/2018   Procedure: CYSTOSCOPY, RETROGRADE, URETEROSCOPY WITH HOLMIUM LASER LITHOTRIPSY/STENT PLACEMENT;  Surgeon: Festus Aloe, MD;  Location: [facility];  Service: Urology;  Laterality: Right;  Marland Kitchen VAGINAL HYSTERECTOMY  2007   Partial  . WISDOM TOOTH EXTRACTION       Social History   reports that she has never smoked. She has never used smokeless tobacco. She reports current alcohol use. She reports that she does not use drugs.   Family History   Her family history includes Alcohol abuse in her brother; Bipolar disorder in her father; COPD in her brother, father, and maternal grandmother; Cervical cancer in her sister; Depression in her paternal grandmother; Diabetes in her mother; Heart attack in her brother, maternal grandmother, and paternal grandmother; Heart attack (age of onset: 47) in her mother; Heart disease in her mother and paternal grandmother; Hyperlipidemia in her brother and maternal grandmother; Hypertension in her brother and father; Parkinson's disease in her  father; Stroke in her brother.   Allergies Allergies  Allergen Reactions  . Cephalexin Nausea Only and Rash     Home Medications  Prior to Admission medications   Medication Sig Start Date End Date Taking? Authorizing Provider  atorvastatin (LIPITOR) 20 MG tablet Take 1 tablet (20 mg total) by mouth daily at 6 PM. 12/29/19   Debbrah Alar, NP  DULoxetine (CYMBALTA) 60 MG capsule 1 cap by mouth once daily (beginning on day 8) 01/26/20   Debbrah Alar, NP  fenofibrate (TRICOR) 145 MG tablet  Take 1 tablet (145 mg total) by mouth daily. 02/13/20   Nahser, Wonda Cheng, MD  indapamide (LOZOL) 1.25 MG tablet Take 1.25 mg by mouth daily.     [provider]  Multiple Vitamins-Minerals (MULTIVITAMIN PO) Take 1 tablet by mouth daily.     [provider]  Omega-3 Fatty Acids (FISH OIL) 1000 MG CAPS Take 2 capsules (2,000 mg total) by mouth 2 (two) times daily. 04/03/18   Debbrah Alar, NP      No charge      Roselie Awkward, MD [place] [facility] Pager: [phone] Cell: [phone] If no response, call [phone]

## 2020-05-02 NOTE — ED Notes (Signed)
Patient verbalizes understanding of discharge instructions. Opportunity for questioning and answers were provided. Armband removed by staff, pt discharged from ED.  

## 2020-05-02 NOTE — ED Provider Notes (Signed)
[facility] EMERGENCY DEPARTMENT Provider Note   CSN: [id number] Arrival date & time: 05/02/20  1104     History Chief Complaint  Patient presents with   COVID +   Shortness of Breath   Chest Pain    Groom is a 49 y.o. female history includes atherosclerosis, fatty liver disease, GERD, migraines, hypertension/hyperlipidemia, LAFB, hysterectomy.  Patient reports that on Tuesday, April 28, 2019 when she developed symptoms of Covid including fever/chills, cough and fatigue.  The next day she tested positive for COVID-19 at health at work.  She reports that her symptoms have been mild until yesterday when she developed shortness of breath.  She describes shortness of breath as a difficulty catching her breath worsened with exertion minimally improved with rest.  Associated with lightheadedness when she exerts herself as well as chest pain which is a tightness in the center of her chest but does not radiate.  She reports her cough is intermittently productive with yellowish sputum.  She has been using expectorants without improvement of her cough.  She has been using Tylenol for her fever with improvement.  Of note patient reports that she received 2 doses of the Pfizer vaccine in January and early February of this year.  Patient believes she was exposed to Covid in [place] during a recent trip.  Denies headache, vision changes, neck stiffness, hemoptysis, abdominal pain, nausea/vomiting, diarrhea, loss of taste/smell, arthralgias/myalgias, extremity swelling/color change or any additional concerns.  HPI     Past Medical History:  Diagnosis Date   Adenoma of right adrenal gland 10/12/2018   Noted on MRI Abd, measuring 2.2 x 2.4 x 2.7 cm   Aortic atherosclerosis ([place]) 10/10/2018   Noted on [place]   Fatty liver 10/12/2018   Noted on MRI Abd   GERD (gastroesophageal reflux disease) 09/16/2015   Mod. noted on KUB   Heart palpitations    High cholesterol      History of chest pain    History of dizziness    History of hiatal hernia 09/16/2015   Small, noted on KUB   History of migraine    Hydronephrosis of right kidney 10/12/2018   Mild, noted on MRI Abd   Hyperlipidemia    Hypertension    no longer on medication   Left anterior fascicular block (LAFB) 01/10/2013   noted on EKG   Left kidney mass 10/12/2018   Hyperechoic mass mid left kidney measuring 1.0 x 1.0 x 1.2 cm, noted on [place] AB   Nephrolithiasis 10/12/2018   Right, Noted on MRI Abd   Pelvic pain    Chronic, right lower quadrant   Wears contact lenses    Wears glasses     Patient Active Problem List   Diagnosis Date Noted   Hypertriglyceridemia 12/04/2019   Chest discomfort 01/09/2013   Hypertension 03/28/2012   Dizziness 03/28/2012    Past Surgical History:  Procedure Laterality Date   ABDOMINAL HYSTERECTOMY     ENDOMETRIAL ABLATION     Pelvic   HOLMIUM LASER APPLICATION Right 02/14/5637   Procedure: HOLMIUM LASER APPLICATION;  Surgeon: Festus Aloe, MD;  Location: [facility];  Service: Urology;  Laterality: Right;   URETEROSCOPY WITH HOLMIUM LASER LITHOTRIPSY Right 11/15/2018   Procedure: CYSTOSCOPY, RETROGRADE, URETEROSCOPY WITH HOLMIUM LASER LITHOTRIPSY/STENT PLACEMENT;  Surgeon: Festus Aloe, MD;  Location: [facility];  Service: Urology;  Laterality: Right;   VAGINAL HYSTERECTOMY  2007   Partial   WISDOM TOOTH EXTRACTION  OB History   No obstetric history on file.     Family History  Problem Relation Age of Onset   Diabetes Mother        Vickki Muff (recent diagnosis) causes thrombocytosis   Heart attack Mother 55   Heart disease Mother    Hypertension Father    COPD Father    Bipolar disorder Father    Parkinson's disease Father    Cervical cancer Sister    Alcohol abuse Brother    COPD Brother    Hyperlipidemia Brother    Hypertension Brother    Stroke Brother     Heart attack Brother    COPD Maternal Grandmother    Hyperlipidemia Maternal Grandmother    Heart attack Maternal Grandmother    Depression Paternal Grandmother    Heart disease Paternal Grandmother    Heart attack Paternal Grandmother     Social History   Tobacco Use   Smoking status: Never Smoker   Smokeless tobacco: Never Used  Scientific laboratory technician Use: Never used  Substance Use Topics   Alcohol use: Yes    Comment: occ   Drug use: No    Home Medications Prior to Admission medications   Medication Sig Start Date End Date Taking? Authorizing Provider  atorvastatin (LIPITOR) 20 MG tablet Take 1 tablet (20 mg total) by mouth daily at 6 PM. 12/29/19   Debbrah Alar, NP  DULoxetine (CYMBALTA) 60 MG capsule 1 cap by mouth once daily (beginning on day 8) 01/26/20   Debbrah Alar, NP  fenofibrate (TRICOR) 145 MG tablet Take 1 tablet (145 mg total) by mouth daily. 02/13/20   Nahser, Wonda Cheng, MD  indapamide (LOZOL) 1.25 MG tablet Take 1.25 mg by mouth daily.     [provider]  Multiple Vitamins-Minerals (MULTIVITAMIN PO) Take 1 tablet by mouth daily.     [provider]  Omega-3 Fatty Acids (FISH OIL) 1000 MG CAPS Take 2 capsules (2,000 mg total) by mouth 2 (two) times daily. 04/03/18   Debbrah Alar, NP    Allergies    Cephalexin  Review of Systems   Review of Systems Ten systems are reviewed and are negative for acute change except as noted in the HPI  Physical Exam Updated Vital Signs BP (!) 159/96 (BP Location: Left Arm)    Pulse (!) 112    Temp 99.6 F (37.6 C) (Oral)    Resp 22    SpO2 95%   Physical Exam Constitutional:      General: She is not in acute distress.    Appearance: Normal appearance. She is well-developed. She is not ill-appearing or diaphoretic.  HENT:     Head: Normocephalic and atraumatic.  Eyes:     General: Vision grossly intact. Gaze aligned appropriately.     Pupils: Pupils are equal, round, and  reactive to light.  Neck:     Trachea: Trachea and phonation normal.  Cardiovascular:     Rate and Rhythm: Regular rhythm. Tachycardia present.  Pulmonary:     Effort: Pulmonary effort is normal. No accessory muscle usage or respiratory distress.     Breath sounds: Normal breath sounds.  Abdominal:     General: There is no distension.     Palpations: Abdomen is soft.     Tenderness: There is no abdominal tenderness. There is no guarding or rebound.  Musculoskeletal:        General: Normal range of motion.     Cervical back: Normal range of motion.  Right lower leg: No edema.     Left lower leg: No edema.  Skin:    General: Skin is warm and dry.  Neurological:     Mental Status: She is alert.     GCS: GCS eye subscore is 4. GCS verbal subscore is 5. GCS motor subscore is 6.     Comments: Speech is clear and goal oriented, follows commands Major Cranial nerves without deficit, no facial droop Moves extremities without ataxia, coordination intact  Psychiatric:        Behavior: Behavior normal.     ED Results / Procedures / Treatments   Labs (all labs ordered are listed, but only abnormal results are displayed) Labs Reviewed  CBC - Abnormal; Notable for the following components:      Result Value   Platelets 433 (*)    All other components within normal limits  BASIC METABOLIC PANEL  D-DIMER, QUANTITATIVE (NOT AT [facility])  I-STAT BETA HCG BLOOD, ED ([facility], [facility], [facility] ONLY)  TROPONIN I (HIGH SENSITIVITY)    EKG EKG Interpretation  Date/Time:  Sunday May 02 2020 11:45:03 EDT Ventricular Rate:  127 PR Interval:  148 QRS Duration: 84 QT Interval:  302 QTC Calculation: 438 R Axis:   -64 Text Interpretation: Sinus tachycardia Left anterior fascicular block Cannot rule out Anterior infarct , age undetermined Abnormal ECG Confirmed by Dene Gentry [phone]) on 05/02/2020 1:49:59 PM   Radiology No results found.  Procedures Procedures (including critical care  time)  Medications Ordered in ED Medications  sodium chloride flush (NS) 0.9 % injection 3 mL (has no administration in time range)    ED Course  I have reviewed the triage vital signs and the nursing notes.  Pertinent labs & imaging results that were available during my care of the patient were reviewed by me and considered in my medical decision making (see chart for details).    MDM Rules/Calculators/A&P                          Additional history obtained from: 1. Nursing notes from this visit. 2. Electronic medical record reviewed. ------------------ I ordered, reviewed and interpreted labs which include: High-sensitivity troponin within normal meds.  LFTs within normal limits.  Lactic 1.5 is reassuring.  Pregnancy test negative.  D-dimer slightly elevated at 0.59, CT PE study ordered.  CBC shows no leukocytosis or anemia.  BMP shows mild AKI.  Chest x-ray:  IMPRESSION:  No acute abnormality of the lungs in AP portable projection.   EKG :Sinus tachycardia Left anterior fascicular block Cannot rule out Anterior infarct , age undetermined Abnormal ECG Confirmed by Dene Gentry [phone]) on 05/02/2020 1:49:59 PM ---- PE study pending.  Care handoff given to Arlean Hopping, PA-C at shift change.  Plan of care is to reassess after the study.  Disposition per oncoming team.  Haze Boyden Howdyshell was evaluated in Emergency Department on 05/02/2020 for the symptoms described in the history of present illness. She was evaluated in the context of the global COVID-19 pandemic, which necessitated consideration that the patient might be at risk for infection with the SARS-CoV-2 virus that causes COVID-19. Institutional protocols and algorithms that pertain to the evaluation of patients at risk for COVID-19 are in a state of rapid change based on information released by regulatory bodies including the CDC and federal and state organizations. These policies and algorithms were followed during the patient's  care in the ED.   Note: Portions of  this report may have been transcribed using voice recognition software. Every effort was made to ensure accuracy; however, inadvertent computerized transcription errors may still be present. Final Clinical Impression(s) / ED Diagnoses Final diagnoses:  None    Rx / DC Orders ED Discharge Orders    None       Gari Crown 05/02/20 1605    Valarie Merino, MD 05/03/20 [phone]

## 2020-05-02 NOTE — Discharge Instructions (Signed)
COVID-19 and general Viral Syndrome Care Instructions:  [id number] is a virus.  Viruses do not require or respond to antibiotics. Treatment is symptomatic care and it is important to note that these symptoms may last for 7-14 days.   Hand washing: Wash your hands throughout the day, but especially before and after touching the face, using the restroom, sneezing, coughing, or touching surfaces that have been coughed or sneezed upon. Hydration: Symptoms of most illnesses will be intensified and complicated by dehydration. Dehydration can also extend the duration of symptoms. Drink plenty of fluids and get plenty of rest. You should be drinking at least half a liter of water an hour to stay hydrated. Electrolyte drinks (ex. Gatorade, Powerade, Pedialyte) are also encouraged. You should be drinking enough fluids to make your urine light yellow, almost clear. If this is not the case, you are not drinking enough water. Please note that some of the treatments indicated below will not be effective if you are not adequately hydrated. Diet: Please concentrate on hydration, however, you may introduce food slowly.  Start with a clear liquid diet, progressed to a full liquid diet, and then bland solids as you are able. Pain or fever: Ibuprofen, Naproxen, or acetaminophen (generic for Tylenol) for pain or fever.  Antiinflammatory medications: Take 600 mg of ibuprofen every 6 hours or 440 mg (over the counter dose) to 500 mg (prescription dose) of naproxen every 12 hours for the next 3 days. After this time, these medications may be used as needed for pain. Take these medications with food to avoid upset stomach. Choose only one of these medications, do not take them together. Acetaminophen (generic for Tylenol): Should you continue to have additional pain while taking the ibuprofen or naproxen, you may add in acetaminophen as needed. Your daily total maximum amount of acetaminophen from all sources should be limited to  4000mg /day for persons without liver problems, or 2000mg /day for those with liver problems. Nausea/vomiting: Use the ondansetron (generic for Zofran) for nausea or vomiting.  This medication may not prevent all vomiting or nausea, but can help facilitate better hydration. Things that can help with nausea/vomiting also include peppermint/menthol candies, vitamin B12, and ginger. Cough: Teas, warm liquids, broths, and honey can help with cough. Albuterol: May use the albuterol as needed for instances of shortness of breath. Zyrtec or Claritin: May add these medication daily to control underlying symptoms of congestion, sneezing, and other signs of allergies.  These medications are available over-the-counter. Generics: Cetirizine (generic for Zyrtec) and loratadine (generic for Claritin). Fluticasone: Use fluticasone (generic for Flonase), as directed, for nasal and sinus congestion.  This medication is available over-the-counter. Congestion: Plain guaifenesin (generic for plain Mucinex) may help relieve congestion. Saline sinus rinses and saline nasal sprays may also help relieve congestion. Sore throat: Warm liquids or Chloraseptic spray may help soothe a sore throat. Gargle twice a day with a salt water solution made from a half teaspoon of salt in a cup of warm water.  Follow up: Follow up with a primary care provider within the next two weeks should symptoms fail to resolve. Return: Return to the ED for significantly worsening symptoms, shortness of breath, persistent vomiting, large amounts of blood in stool, or any other major concerns.  For prescription assistance, may try using prescription discount sites or apps, such as goodrx.com  COVID-19 isolation recommendations  Patients who have symptoms consistent with COVID-19 should self isolate until: At least 3 days (72 hours) have passed since recovery, defined as  resolution of fever without the use of fever reducing medications and improvement  in respiratory symptoms (e.g., cough, shortness of breath), and At least 7 days have passed since symptoms first appeared. Retesting is not required and not recommended as patients can continue to test positive for several weeks despite lack of symptoms.

## 2020-05-02 NOTE — ED Provider Notes (Signed)
Jane Montes is a 50 y.o. female, presenting to the ED with fever, cough, fatigue.  Diagnosed with COVID-19 infection earlier this week.  Developed shortness of breath and chest tightness yesterday.  Upon my interview, patient states she feels much better.  Denies shortness of breath or chest discomfort.  HPI from Nuala Alpha, PA-C: "Jane Montes is a 49 y.o. female history includes atherosclerosis, fatty liver disease, GERD, migraines, hypertension/hyperlipidemia, LAFB, hysterectomy.  Patient reports that on Tuesday, April 28, 2019 when she developed symptoms of Covid including fever/chills, cough and fatigue.  The next day she tested positive for COVID-19 at health at work.  She reports that her symptoms have been mild until yesterday when she developed shortness of breath.  She describes shortness of breath as a difficulty catching her breath worsened with exertion minimally improved with rest.  Associated with lightheadedness when she exerts herself as well as chest pain which is a tightness in the center of her chest but does not radiate.  She reports her cough is intermittently productive with yellowish sputum.  She has been using expectorants without improvement of her cough.  She has been using Tylenol for her fever with improvement.  Of note patient reports that she received 2 doses of the Pfizer vaccine in January and early February of this year.  Patient believes she was exposed to Covid in [place] during a recent trip.  Denies headache, vision changes, neck stiffness, hemoptysis, abdominal pain, nausea/vomiting, diarrhea, loss of taste/smell, arthralgias/myalgias, extremity swelling/color change or any additional concerns."  Past Medical History:  Diagnosis Date  . Adenoma of right adrenal gland 10/12/2018   Noted on MRI Abd, measuring 2.2 x 2.4 x 2.7 cm  . Aortic atherosclerosis ([place]) 10/10/2018   Noted on [place]  . Fatty liver 10/12/2018   Noted on MRI Abd  . GERD  (gastroesophageal reflux disease) 09/16/2015   Mod. noted on KUB  . Heart palpitations   . High cholesterol   . History of chest pain   . History of dizziness   . History of hiatal hernia 09/16/2015   Small, noted on KUB  . History of migraine   . Hydronephrosis of right kidney 10/12/2018   Mild, noted on MRI Abd  . Hyperlipidemia   . Hypertension    no longer on medication  . Left anterior fascicular block (LAFB) 01/10/2013   noted on EKG  . Left kidney mass 10/12/2018   Hyperechoic mass mid left kidney measuring 1.0 x 1.0 x 1.2 cm, noted on [place] AB  . Nephrolithiasis 10/12/2018   Right, Noted on MRI Abd  . Pelvic pain    Chronic, right lower quadrant  . Wears contact lenses   . Wears glasses     Physical Exam  BP (!) 137/99   Pulse (!) 111   Temp 99.6 F (37.6 C) (Oral)   Resp 18   SpO2 99%   Physical Exam Vitals and nursing note reviewed.  Constitutional:      General: She is not in acute distress.    Appearance: She is well-developed. She is not diaphoretic.  HENT:     Head: Normocephalic and atraumatic.     Mouth/Throat:     Mouth: Mucous membranes are moist.     Pharynx: Oropharynx is clear.  Eyes:     Conjunctiva/sclera: Conjunctivae normal.  Cardiovascular:     Rate and Rhythm: Normal rate and regular rhythm.     Pulses: Normal pulses.  Radial pulses are 2+ on the right side and 2+ on the left side.  Pulmonary:     Effort: Pulmonary effort is normal. No respiratory distress.     Breath sounds: Normal breath sounds.     Comments: No increased work of breathing noted.  Speaks in full sentences without noted difficulty. Abdominal:     Palpations: Abdomen is soft.     Tenderness: There is no abdominal tenderness. There is no guarding.  Musculoskeletal:     Cervical back: Neck supple.     Right lower leg: No edema.     Left lower leg: No edema.  Lymphadenopathy:     Cervical: No cervical adenopathy.  Skin:    General: Skin is warm and dry.   Neurological:     Mental Status: She is alert.  Psychiatric:        Mood and Affect: Mood and affect normal.        Speech: Speech normal.        Behavior: Behavior normal.     ED Course/Procedures    Procedures   Abnormal Labs Reviewed  BASIC METABOLIC PANEL - Abnormal; Notable for the following components:      Result Value   Glucose, Bld 100 (*)    Creatinine, Ser 1.28 (*)    GFR calc non Af Amer 49 (*)    GFR calc Af Amer 57 (*)    All other components within normal limits  CBC - Abnormal; Notable for the following components:   Platelets 433 (*)    All other components within normal limits  D-DIMER, QUANTITATIVE (NOT AT [facility]) - Abnormal; Notable for the following components:   D-Dimer, Quant 0.59 (*)    All other components within normal limits  TRIGLYCERIDES - Abnormal; Notable for the following components:   Triglycerides 229 (*)    All other components within normal limits    CT Angio Chest PE W and/or Wo Contrast  Result Date: 05/02/2020 CLINICAL DATA:  PE suspected, recent COVID diagnosis EXAM: CT ANGIOGRAPHY CHEST WITH CONTRAST TECHNIQUE: Multidetector CT imaging of the chest was performed using the standard protocol during bolus administration of intravenous contrast. Multiplanar CT image reconstructions and MIPs were obtained to evaluate the vascular anatomy. CONTRAST:  58mL OMNIPAQUE IOHEXOL 350 MG/ML SOLN COMPARISON:  None. FINDINGS: Cardiovascular: Satisfactory opacification of the pulmonary arteries to the segmental level. No evidence of pulmonary embolism. Normal heart size. No pericardial effusion. Mediastinum/Nodes: No enlarged mediastinal, hilar, or axillary lymph nodes. Thyroid gland, trachea, and esophagus demonstrate no significant findings. Lungs/Pleura: Lungs are clear. No pleural effusion or pneumothorax. Upper Abdomen: No acute abnormality. Definitively benign, fat containing right adrenal adenoma. Musculoskeletal: No chest wall abnormality. No acute  or significant osseous findings. Review of the MIP images confirms the above findings. IMPRESSION: 1. Negative examination for pulmonary embolism. 2. No acute airspace disease. Electronically Signed   By: Eddie Candle M.D.   On: 05/02/2020 16:16   DG Chest Portable 1 View  Result Date: 05/02/2020 CLINICAL DATA:  Shortness of breath, COVID EXAM: PORTABLE CHEST 1 VIEW COMPARISON:  01/06/2013 FINDINGS: The heart size and mediastinal contours are within normal limits. Both lungs are clear. The visualized skeletal structures are unremarkable. IMPRESSION: No acute abnormality of the lungs in AP portable projection. Electronically Signed   By: Eddie Candle M.D.   On: 05/02/2020 14:04     EKG Interpretation  Date/Time:  Sunday May 02 2020 11:45:03 EDT Ventricular Rate:  127 PR Interval:  148  QRS Duration: 84 QT Interval:  302 QTC Calculation: 438 R Axis:   -64 Text Interpretation: Sinus tachycardia Left anterior fascicular block Cannot rule out Anterior infarct , age undetermined Abnormal ECG Confirmed by Dene Gentry [phone]) on 05/02/2020 1:49:59 PM       MDM   Clinical Course as of May 02 2005  Sun May 02, 2020  1804 Pulse rate now 90.   [SJ]    Clinical Course User Index [SJ] Lorayne Bender, PA-C   Patient care handoff report received from [facility], [place]. Plan: Follow-up on CT PE study.  Reevaluate patient.  Disposition accordingly.  Patient states she feels much better upon my assessment of her. CT PE study without acute abnormalities. Initially tachycardic, resolved with hydration.  Patient received monoclonal antibody infusion, ordered by Dr. Lake Bells, CCM. Dr. Lake Bells kindly consulted on this patient as a courtesy, we appreciate his consultation.  It should be noted that the patient was not a critical care admission candidate.  The patient was given instructions for home care as well as return precautions. Patient voices understanding of these instructions, accepts the  plan, and is comfortable with discharge.  I reviewed and interpreted the patient's labs and radiological studies.   Vitals:   05/02/20 1147 05/02/20 1229 05/02/20 1345  BP: (!) 156/122 (!) 159/96 (!) 137/99  Pulse: (!) 120 (!) 112 (!) 111  Resp: 22 22 18   Temp: 99.6 F (37.6 C)    TempSrc: Oral    SpO2: 97% 95% 99%      Layla Maw 05/02/20 2010    Valarie Merino, MD 05/03/20 [phone]

## 2020-05-03 ENCOUNTER — Telehealth: Payer: Self-pay

## 2020-05-03 NOTE — Telephone Encounter (Signed)
Patient evaluated/treated in ER.    [facility] [place] Night - Client TELEPHONE ADVICE RECORD [facility] Patient Name: Jane Montes Gender: Unknown DOB: 1971-07-21 Age: 49 Y Return Phone Number: [phone] (Primary) Address: City/State/Zip: [place] [address] Client  [facility] [place] [facility] - Client Client Site [facility] [place] - Night Physician Debbrah Alar - NP Contact Type Call Who Is Calling Patient / Member / Family / Caregiver Call Type Triage / Clinical Relationship To Patient Self Return Phone Number [phone] (Primary) Chief Complaint BREATHING - shortness of breath or sounds breathless Reason for Call Request to Speak to a Physician Initial Comment The pts breathing is becoming more labored. She has cough that makes her feel like she is choking. She has yellow sputum and a temperature of 102.4. Translation No Nurse Assessment Nurse: Harvie Bridge, RN, Beth Date/Time (Eastern Time): 05/02/2020 10:11:30 AM Confirm and document reason for call. If symptomatic, describe symptoms. ---The pts breathing is becoming more labored. She has cough that makes her feel like she is choking. She has yellow sputum and a temperature of 102.4. Tested positive for Covid Wednesday Has the patient had close contact with a person known or suspected to have the novel coronavirus illness OR traveled / lives in area with major community spread (including international travel) in the last 14 days from the onset of symptoms? * If Asymptomatic, screen for exposure and travel within the last 14 days. ---No Does the patient have any new or worsening symptoms? ---Yes Will a triage be completed? ---Yes Related visit to physician within the last 2 weeks? ---Yes Does the PT have any chronic conditions? (i.e. diabetes, asthma, this includes High risk factors for pregnancy, etc.) ---No Is the patient pregnant or possibly pregnant? (Ask all  females between the ages of 57-55) ---No Is this a behavioral health or substance abuse call? ---No Guidelines Guideline Title Affirmed Question Affirmed Notes Nurse Date/Time (Eastern Time) COVID-19 - Diagnosed or Suspected MODERATE difficulty breathing (e.g., speaks in phrases, SOB even at rest, pulse 100-120) Newhart, RN, Beth 05/02/2020 10:13:38 AM PLEASE NOTE: All timestamps contained within this report are represented as [place] Standard Time. CONFIDENTIALTY NOTICE: This fax transmission is intended only for the addressee. It contains information that is legally privileged, confidential or otherwise protected from use or disclosure. If you are not the intended recipient, you are strictly prohibited from reviewing, disclosing, copying using or disseminating any of this information or taking any action in reliance on or regarding this information. If you have received this fax in error, please notify us immediately by telephone so that we can arrange for its return to [place]. Phone: [phone], Toll-Free: [phone], Fax: [phone] Page: 2 of 2 Call Id: [id number] [place]. Time Eilene Ghazi Time) Disposition Final User 05/02/2020 10:10:09 AM Send to Urgent Queue Carlis Stable 05/02/2020 10:14:54 AM Go to ED Now Yes Harvie Bridge, RN, Beth Caller Disagree/Comply Comply Caller Understands Yes PreDisposition InappropriateToAsk Care Advice Given Per Guideline GO TO ED NOW: * You need to be seen in the Emergency Department. YOU SHOULD TELL HEALTHCARE PERSONNEL THAT YOU MIGHT HAVE COVID-19: * Tell the first healthcare worker you meet that you may have COVID-19. WEAR A MASK - COVER YOUR MOUTH AND NOSE: * Wear a mask. ANOTHER ADULT SHOULD DRIVE: CALL EMS [phone] IF: * Severe difficulty breathing occurs * Lips or face turns blue * Confusion occurs. CARE ADVICE given per COVID-19 - DIAGNOSED OR SUSPECTED (Adult) guideline. Referrals [place]

## 2020-05-05 MED FILL — FENOFIBRATE 145 MG TABS: 145 | 30 days supply | Qty: 30 | Fill #5

## 2020-05-05 MED FILL — ATORVASTATIN 20 MG TABLET: 20 | 30 days supply | Qty: 30 | Fill #4

## 2020-05-05 MED FILL — INDAPAMIDE 1.25 MG TABS: 1.25 | 30 days supply | Qty: 30 | Fill #7

## 2020-05-05 MED FILL — DULoxetine HCL 60 MG CPEP: 60 | 30 days supply | Qty: 30 | Fill #3

## 2020-05-07 LAB — CULTURE, BLOOD (ROUTINE X 2)
Culture: NO GROWTH
Culture: NO GROWTH
Special Requests: ADEQUATE
Special Requests: ADEQUATE

## 2020-05-18 LAB — HM DIABETES EYE EXAM

## 2020-06-01 MED FILL — INDAPAMIDE 1.25 MG TABS: 1.25 | 30 days supply | Qty: 30 | Fill #8

## 2020-06-01 MED FILL — ATORVASTATIN 20 MG TABLET: 20 | 30 days supply | Qty: 30 | Fill #5

## 2020-06-01 MED FILL — FENOFIBRATE 145 MG TABS: 145 | 30 days supply | Qty: 30 | Fill #0

## 2020-06-01 MED FILL — DULoxetine HCL 60 MG CPEP: 60 | 30 days supply | Qty: 30 | Fill #4

## 2020-07-06 MED FILL — INDAPAMIDE 1.25 MG TABS: 1.25 | 30 days supply | Qty: 30 | Fill #9

## 2020-07-06 MED FILL — ATORVASTATIN 20 MG TABLET: 20 | 30 days supply | Qty: 30 | Fill #1

## 2020-07-06 MED FILL — DULoxetine HCL 60 MG CPEP: 60 | 30 days supply | Qty: 30 | Fill #5

## 2020-07-06 MED FILL — FENOFIBRATE 145 MG TABS: 145 | 30 days supply | Qty: 30 | Fill #1

## 2020-07-27 ENCOUNTER — Ambulatory Visit: Payer: 59 | Admitting: Family

## 2020-08-03 ENCOUNTER — Other Ambulatory Visit (HOSPITAL_COMMUNITY): Payer: Self-pay | Admitting: Nurse Practitioner

## 2020-08-03 ENCOUNTER — Other Ambulatory Visit: Payer: Self-pay | Admitting: Family

## 2020-08-03 MED FILL — INDAPAMIDE 1.25 MG TABS: 1.25 | 30 days supply | Qty: 30 | Fill #0

## 2020-08-03 MED FILL — ATORVASTATIN 20 MG TABLET: 20 | 30 days supply | Qty: 30 | Fill #2

## 2020-08-03 MED FILL — DULoxetine HCL 60 MG CPEP: 60 | 30 days supply | Qty: 30 | Fill #0

## 2020-08-03 MED FILL — FENOFIBRATE 145 MG TABS: 145 | 30 days supply | Qty: 30 | Fill #2

## 2020-08-10 ENCOUNTER — Encounter: Payer: Self-pay | Admitting: Family

## 2020-08-10 ENCOUNTER — Other Ambulatory Visit: Payer: Self-pay

## 2020-08-10 ENCOUNTER — Other Ambulatory Visit: Payer: Self-pay | Admitting: Family

## 2020-08-10 ENCOUNTER — Ambulatory Visit: Payer: 59 | Admitting: Family

## 2020-08-10 VITALS — BP 116/72 | HR 86 | Resp 16 | Ht 62.0 in | Wt 212.0 lb

## 2020-08-10 DIAGNOSIS — F32A Depression, unspecified: Secondary | ICD-10-CM | POA: Diagnosis not present

## 2020-08-10 DIAGNOSIS — R635 Abnormal weight gain: Secondary | ICD-10-CM | POA: Diagnosis not present

## 2020-08-10 DIAGNOSIS — F419 Anxiety disorder, unspecified: Secondary | ICD-10-CM

## 2020-08-10 DIAGNOSIS — E785 Hyperlipidemia, unspecified: Secondary | ICD-10-CM | POA: Diagnosis not present

## 2020-08-10 MED ORDER — HYDROXYZINE HCL 25 MG PO TABS: 25.0000 mg | ORAL_TABLET | Freq: Three times a day (TID) | ORAL | 3 refills | Status: DC | PRN

## 2020-08-10 MED FILL — hydrOXYzine HCL 25 MG TABS: 25 | 10 days supply | Qty: 30 | Fill #0

## 2020-08-10 NOTE — Patient Instructions (Addendum)
You may use hydroxyzine 3 times daily as needed for anxiety.   Psychiatric Services:   Dr. Chucky May [facility]) - [phone] [place]) - [phone] [place]) [phone] [facility] [place]) [phone] [place] ([place]) - [phone] [facility] [place]) - [place], [address] [place], [place]

## 2020-08-10 NOTE — Progress Notes (Signed)
Subjective:    Patient ID: Jane Montes, female    DOB: [birth year], 49 y.o.   MRN: [id number]  HPI  Patient is a 49 yr old female who presents today for follow up.  Depression- Reports that she has ben having a lot of anxiety/panic attacks. She is continuing cymbalta 60mg .  Overall depression has been stable, but anxiety has worsened.  She plans to be changing her job which she is hoping will be less stressful.   Wt Readings from Last 3 Encounters:  08/10/20 212 lb (96.2 kg)  02/13/20 197 lb (89.4 kg)  01/26/20 197 lb (89.4 kg)   She reports less exercise lately.  Diet has been fair.  She started developing hot flashes back late this summer when she had COVID.   Hyperlipidemia- continues statin and fenofibrate.   Lab Results  Component Value Date   CHOL 137 01/19/2020   HDL 39.10 01/19/2020   LDLCALC 68 01/19/2020   LDLDIRECT 82.0 12/03/2019   TRIG 229 (H) 05/02/2020   CHOLHDL 4 01/19/2020      Review of Systems    see HPI  Past Medical History:  Diagnosis Date   Adenoma of right adrenal gland 10/12/2018   Noted on MRI Abd, measuring 2.2 x 2.4 x 2.7 cm   Aortic atherosclerosis ([place]) 10/10/2018   Noted on [place]   Fatty liver 10/12/2018   Noted on MRI Abd   GERD (gastroesophageal reflux disease) 09/16/2015   Mod. noted on KUB   Heart palpitations    High cholesterol    History of chest pain    History of dizziness    History of hiatal hernia 09/16/2015   Small, noted on KUB   History of migraine    Hydronephrosis of right kidney 10/12/2018   Mild, noted on MRI Abd   Hyperlipidemia    Hypertension    no longer on medication   Left anterior fascicular block (LAFB) 01/10/2013   noted on EKG   Left kidney mass 10/12/2018   Hyperechoic mass mid left kidney measuring 1.0 x 1.0 x 1.2 cm, noted on [place] AB   Nephrolithiasis 10/12/2018   Right, Noted on MRI Abd   Pelvic pain    Chronic, right lower quadrant   Wears contact lenses    Wears  glasses      Social History   Socioeconomic History   Marital status: Legally Separated    Spouse name: Not on file   Number of children: Not on file   Years of education: Not on file   Highest education level: Not on file  Occupational History   Occupation: Nurse    Employer: [employer]  Tobacco Use   Smoking status: Never Smoker   Smokeless tobacco: Never Used  Vaping Use   Vaping Use: Never used  Substance and Sexual Activity   Alcohol use: Yes    Comment: occ   Drug use: No   Sexual activity: Not Currently    Birth control/protection: Surgical  Other Topics Concern   Not on file  Social History Narrative   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     Social Determinants of Health   Financial Resource Strain:    Difficulty of Paying Living Expenses: Not on file  Food Insecurity:    Worried About [place] in the Last Year: Not on file   [employer]  in the Last Year: Not on file  Transportation Needs:    Lack of Transportation (Medical): Not on file   Lack of Transportation (Non-Medical): Not on file  Physical Activity:    Days of Exercise per Week: Not on file   Minutes of Exercise per Session: Not on file  Stress:    Feeling of Stress : Not on file  Social Connections:    Frequency of Communication with Friends and Family: Not on file   Frequency of Social Gatherings with Friends and Family: Not on file   Attends Religious Services: Not on file   Active Member of Clubs or Organizations: Not on file   Attends Archivist Meetings: Not on file   Marital Status: Not on file  Intimate Partner Violence:    Fear of Current or Ex-Partner: Not on file   Emotionally Abused: Not on file   Physically Abused: Not on file   Sexually Abused: Not on file    Past Surgical History:  Procedure Laterality Date   ABDOMINAL HYSTERECTOMY      ENDOMETRIAL ABLATION     Pelvic   HOLMIUM LASER APPLICATION Right 03/09/6072   Procedure: HOLMIUM LASER APPLICATION;  Surgeon: Festus Aloe, MD;  Location: [facility];  Service: Urology;  Laterality: Right;   URETEROSCOPY WITH HOLMIUM LASER LITHOTRIPSY Right 11/15/2018   Procedure: CYSTOSCOPY, RETROGRADE, URETEROSCOPY WITH HOLMIUM LASER LITHOTRIPSY/STENT PLACEMENT;  Surgeon: Festus Aloe, MD;  Location[facility];  Service: Urology;  Laterality: Right;   VAGINAL HYSTERECTOMY  2007   Partial   WISDOM TOOTH EXTRACTION      Family History  Problem Relation Age of Onset   Diabetes Mother        Vickki Muff (recent diagnosis) causes thrombocytosis   Heart attack Mother 74   Heart disease Mother    Hypertension Father    COPD Father    Bipolar disorder Father    Parkinson's disease Father    Cervical cancer Sister    Alcohol abuse Brother    COPD Brother    Hyperlipidemia Brother    Hypertension Brother    Stroke Brother    Heart attack Brother    COPD Maternal Grandmother    Hyperlipidemia Maternal Grandmother    Heart attack Maternal Grandmother    Depression Paternal Grandmother    Heart disease Paternal Grandmother    Heart attack Paternal Grandmother     Allergies  Allergen Reactions   Cephalexin Nausea Only and Rash    Current Outpatient Medications on File Prior to Visit  Medication Sig Dispense Refill   atorvastatin (LIPITOR) 20 MG tablet Take 1 tablet (20 mg total) by mouth daily at 6 PM. 90 tablet 1   DULoxetine (CYMBALTA) 60 MG capsule TAKE 1 CAPSULE BY MOUTH ONCE A DAY -  Needs ov 30 capsule 0   fenofibrate (TRICOR) 145 MG tablet Take 1 tablet (145 mg total) by mouth daily. 90 tablet 1   indapamide (LOZOL) 1.25 MG tablet Take 1.25 mg by mouth daily.      Multiple Vitamins-Minerals (MULTIVITAMIN PO) Take 1 tablet by mouth daily.      Omega-3 Fatty Acids (FISH OIL) 1000 MG CAPS Take 2  capsules (2,000 mg total) by mouth 2 (two) times daily.  0   No current facility-administered medications on file prior to visit.    BP 116/72 (BP Location: Left Arm, Patient Position: Sitting, Cuff Size: Normal)    Pulse 86    Resp 16  Ht 5\' 2"  (1.575 m)    Wt 212 lb (96.2 kg)    SpO2 96%    BMI 38.78 kg/m    Objective:   Physical Exam Constitutional:      Appearance: She is well-developed.  Neck:     Thyroid: No thyromegaly.  Cardiovascular:     Rate and Rhythm: Normal rate and regular rhythm.     Heart sounds: Normal heart sounds. No murmur heard.   Pulmonary:     Effort: Pulmonary effort is normal. No respiratory distress.     Breath sounds: Normal breath sounds. No wheezing.  Musculoskeletal:     Cervical back: Neck supple.  Skin:    General: Skin is warm and dry.  Neurological:     Mental Status: She is alert and oriented to person, place, and time.  Psychiatric:        Behavior: Behavior normal.        Thought Content: Thought content normal.        Judgment: Judgment normal.           Assessment & Plan:  Depression/anxiety- anxiety is uncontrolled.  Recommended trial of prn atarax.  She is hesitant to increase cymbalta. Recommended that she try to add in some exercise for anxiety and weight management.  Continue to work with her therapist. I did give her some numbers for psychiatry to call to establish.   Hyperlipidemia- LDL at goal. Trigs mildly elevated- continue diet/exercise/weight loss efforts.  Continue fenofibrate 145mg  and atorvastatin 20mg .   Weight gain- discussed diet, exercise, weight loss.   This visit occurred during the SARS-CoV-2 public health emergency.  Safety protocols were in place, including screening questions prior to the visit, additional usage of staff PPE, and extensive cleaning of exam room while observing appropriate contact time as indicated for disinfecting solutions.

## 2020-08-24 ENCOUNTER — Encounter: Payer: Self-pay | Admitting: Family

## 2020-08-25 ENCOUNTER — Other Ambulatory Visit: Payer: Self-pay | Admitting: Family

## 2020-08-25 MED ORDER — DULOXETINE HCL 30 MG PO CPEP
ORAL_CAPSULE | ORAL | 0 refills | Status: DC
Start: 2020-08-25 — End: 2020-09-29

## 2020-08-25 MED FILL — DULoxetine HCL 30 MG CPEP: 30 | 21 days supply | Qty: 18 | Fill #0

## 2020-09-06 MED FILL — ATORVASTATIN CALCIUM 20 MG: 20 | 30 days supply | Qty: 30 | Fill #3

## 2020-09-06 MED FILL — INDAPAMIDE 1.25 MG TABS: 1.25 | 30 days supply | Qty: 30 | Fill #1

## 2020-09-06 MED FILL — FENOFIBRATE 145 MG TABS: 145 | 30 days supply | Qty: 30 | Fill #3

## 2020-09-08 MED FILL — hydrOXYzine HCL 25 MG TABS: 25 | 10 days supply | Qty: 30 | Fill #1

## 2020-09-10 ENCOUNTER — Encounter: Payer: Self-pay | Admitting: Family

## 2020-09-21 ENCOUNTER — Telehealth: Payer: Self-pay | Admitting: Family

## 2020-09-21 ENCOUNTER — Telehealth (INDEPENDENT_AMBULATORY_CARE_PROVIDER_SITE_OTHER): Payer: 59 | Admitting: Family

## 2020-09-21 ENCOUNTER — Other Ambulatory Visit (INDEPENDENT_AMBULATORY_CARE_PROVIDER_SITE_OTHER): Payer: 59

## 2020-09-21 ENCOUNTER — Other Ambulatory Visit: Payer: Self-pay

## 2020-09-21 ENCOUNTER — Ambulatory Visit (HOSPITAL_COMMUNITY)
Admission: RE | Admit: 2020-09-21 | Discharge: 2020-09-21 | Disposition: A | Discharge status: Home / Self Care | Payer: 59 | Source: Ambulatory Visit | Attending: Family | Admitting: Family

## 2020-09-21 DIAGNOSIS — F32A Depression, unspecified: Secondary | ICD-10-CM

## 2020-09-21 DIAGNOSIS — R1011 Right upper quadrant pain: Secondary | ICD-10-CM

## 2020-09-21 DIAGNOSIS — F419 Anxiety disorder, unspecified: Secondary | ICD-10-CM | POA: Diagnosis not present

## 2020-09-21 LAB — CBC WITH DIFFERENTIAL/PLATELET
Basophils Absolute: 0.1 10*3/uL (ref 0.0–0.1)
Basophils Relative: 1.4 % (ref 0.0–3.0)
Eosinophils Absolute: 0.2 10*3/uL (ref 0.0–0.7)
Eosinophils Relative: 2.5 % (ref 0.0–5.0)
HCT: 37.8 % (ref 36.0–46.0)
Hemoglobin: 13.1 g/dL (ref 12.0–15.0)
Lymphocytes Relative: 25.6 % (ref 12.0–46.0)
Lymphs Abs: 2.2 10*3/uL (ref 0.7–4.0)
MCHC: 34.8 g/dL (ref 30.0–36.0)
MCV: 87.2 fl (ref 78.0–100.0)
Monocytes Absolute: 0.7 10*3/uL (ref 0.1–1.0)
Monocytes Relative: 8.1 % (ref 3.0–12.0)
Neutro Abs: 5.3 10*3/uL (ref 1.4–7.7)
Neutrophils Relative %: 62.4 % (ref 43.0–77.0)
Platelets: 391 10*3/uL (ref 150.0–400.0)
RBC: 4.34 Mil/uL (ref 3.87–5.11)
RDW: 13.1 % (ref 11.5–15.5)
WBC: 8.5 10*3/uL (ref 4.0–10.5)

## 2020-09-21 LAB — COMPREHENSIVE METABOLIC PANEL
ALT: 26 U/L (ref 0–35)
AST: 20 U/L (ref 0–37)
Albumin: 4.3 g/dL (ref 3.5–5.2)
Alkaline Phosphatase: 55 U/L (ref 39–117)
BUN: 26 mg/dL — ABNORMAL HIGH (ref 6–23)
CO2: 26 mEq/L (ref 19–32)
Calcium: 9.2 mg/dL (ref 8.4–10.5)
Chloride: 101 mEq/L (ref 96–112)
Creatinine, Ser: 1.39 mg/dL — ABNORMAL HIGH (ref 0.40–1.20)
GFR: 44.6 mL/min — ABNORMAL LOW (ref >60.00)
Glucose, Bld: 94 mg/dL (ref 70–99)
Potassium: 3.6 mEq/L (ref 3.5–5.1)
Sodium: 136 mEq/L (ref 135–145)
Total Bilirubin: 0.5 mg/dL (ref 0.2–1.2)
Total Protein: 7.2 g/dL (ref 6.0–8.3)

## 2020-09-21 LAB — LIPASE: Lipase: 40 U/L (ref 11.0–59.0)

## 2020-09-21 NOTE — Patient Instructions (Signed)
Please complete abdominal US and lab work today.   Go to the ER if severe/worsening abdominal pain or if you are unable to keep down food/liquid.

## 2020-09-21 NOTE — Telephone Encounter (Signed)
See mychart.  

## 2020-09-21 NOTE — Progress Notes (Signed)
Virtual Visit via Video Note  I connected with Jane Montes on 09/21/20 at  8:40 AM EST by a video enabled telemedicine application and verified that I am speaking with the correct person using two identifiers.  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 The patient expressed understanding and agreed to proceed. Only the patient and myself were present for today's video call.   History of Present Illness:  Patient is a 49 yr old female who presents today for follow up.  Since we last saw her, we slowly tapered her cymbalta.  She reports that the taper was smooth. She did notice some dizziness/mild HA's at the end of the first week, but this resolved.  She is completely off of cymbalta for the last few weeks and reports that her mood is good and she is not having any significant anxiety.   She does report that she has had some RUQ pain and bloating for the last two weeks. Pain waxes and wanes, but is worse after meals.    Observations/Objective:   Gen: Awake, alert, no acute distress Resp: Breathing is even and non-labored Psych: calm/pleasant demeanor Neuro: Alert and Oriented x 3, + facial symmetry, speech is clear.   Assessment and Plan:  Anxiety/Depression- currently stable off of cymbalta.  She will let me know if anything changes.  RUQ pain- hx is concerning for cholecystitis.  Will obtain abdominal US today as well as CMET, lipase, CBC. She is moving her bowels but notes "different colors."  Reports urine has been dark.  She is trying to stay hydrated.   Follow Up Instructions:    I discussed the assessment and treatment plan with the patient. The patient was provided an opportunity to ask questions and all were answered. The patient agreed with the plan and demonstrated an understanding of the instructions.   The patient was advised to call back or seek an  in-person evaluation if the symptoms worsen or if the condition fails to improve as anticipated.  Nance Pear, NP

## 2020-09-22 ENCOUNTER — Telehealth: Payer: Self-pay

## 2020-09-22 NOTE — Telephone Encounter (Signed)
caller states her doctor called and advised that she schedule an appt.  Telephone: [place] requested she call and schedule in person appt.

## 2020-09-23 NOTE — Telephone Encounter (Signed)
Appt scheduled 09/29/2020.

## 2020-09-29 ENCOUNTER — Other Ambulatory Visit: Payer: Self-pay

## 2020-09-29 ENCOUNTER — Encounter: Payer: Self-pay | Admitting: Family

## 2020-09-29 ENCOUNTER — Ambulatory Visit: Payer: 59 | Admitting: Family

## 2020-09-29 ENCOUNTER — Telehealth: Payer: Self-pay | Admitting: Family

## 2020-09-29 VITALS — BP 108/63 | HR 81 | Temp 98.6°F | Resp 16 | Ht 62.0 in | Wt 215.0 lb

## 2020-09-29 DIAGNOSIS — K819 Cholecystitis, unspecified: Secondary | ICD-10-CM

## 2020-09-29 DIAGNOSIS — R1011 Right upper quadrant pain: Secondary | ICD-10-CM

## 2020-09-29 DIAGNOSIS — K760 Fatty (change of) liver, not elsewhere classified: Secondary | ICD-10-CM

## 2020-09-29 DIAGNOSIS — N289 Disorder of kidney and ureter, unspecified: Secondary | ICD-10-CM | POA: Diagnosis not present

## 2020-09-29 LAB — BASIC METABOLIC PANEL
BUN: 19 mg/dL (ref 6–23)
CO2: 27 mEq/L (ref 19–32)
Calcium: 9.5 mg/dL (ref 8.4–10.5)
Chloride: 104 mEq/L (ref 96–112)
Creatinine, Ser: 1.25 mg/dL — ABNORMAL HIGH (ref 0.40–1.20)
GFR: 50.65 mL/min — ABNORMAL LOW (ref >60.00)
Glucose, Bld: 97 mg/dL (ref 70–99)
Potassium: 4.2 mEq/L (ref 3.5–5.1)
Sodium: 138 mEq/L (ref 135–145)

## 2020-09-29 LAB — HEPATIC FUNCTION PANEL
ALT: 34 U/L (ref 0–35)
AST: 28 U/L (ref 0–37)
Albumin: 4.3 g/dL (ref 3.5–5.2)
Alkaline Phosphatase: 54 U/L (ref 39–117)
Bilirubin, Direct: 0.1 mg/dL (ref 0.0–0.3)
Total Bilirubin: 0.6 mg/dL (ref 0.2–1.2)
Total Protein: 6.9 g/dL (ref 6.0–8.3)

## 2020-09-29 NOTE — Patient Instructions (Signed)
Please complete lab work prior to leaving. You should be contacted about scheduling your HIDA scan. Please keep your upcoming appointment with the surgeon.

## 2020-09-29 NOTE — Progress Notes (Signed)
Subjective:    Patient ID: Jane Montes, female    DOB: 06-Nov-1970, 49 y.o.   MRN: [id number]  HPI  Patient is a 49 yr old female who presents today for follow up. Last visit we discussed RUQ pain.  Abdominal US was performed as well as lab work.  CBC, LFT were normal.  She was noted to have a mild increase in her Cr.  Prior to this visit she had see urology for kidney stones which had apparently passed. Abdominal US noted contracted gallbladder and non-obstructing right sided nephrolithiasis.  Fatty liver was also noted.   Notes some bloating.  Has RUQ pain after fatty meals.    Has been using advil prn sometimes- mostly using tylenol though.    Review of Systems See HPI  Past Medical History:  Diagnosis Date  . Adenoma of right adrenal gland 10/12/2018   Noted on MRI Abd, measuring 2.2 x 2.4 x 2.7 cm  . Aortic atherosclerosis ([place]) 10/10/2018   Noted on [place]  . Fatty liver 10/12/2018   Noted on MRI Abd  . GERD (gastroesophageal reflux disease) 09/16/2015   Mod. noted on KUB  . Heart palpitations   . High cholesterol   . History of chest pain   . History of dizziness   . History of hiatal hernia 09/16/2015   Small, noted on KUB  . History of migraine   . Hydronephrosis of right kidney 10/12/2018   Mild, noted on MRI Abd  . Hyperlipidemia   . Hypertension    no longer on medication  . Left anterior fascicular block (LAFB) 01/10/2013   noted on EKG  . Left kidney mass 10/12/2018   Hyperechoic mass mid left kidney measuring 1.0 x 1.0 x 1.2 cm, noted on [place] AB  . Nephrolithiasis 10/12/2018   Right, Noted on MRI Abd  . Pelvic pain    Chronic, right lower quadrant  . Wears contact lenses   . Wears glasses      Social History   Socioeconomic History  . Marital status: Legally Separated    Spouse name: Not on file  . Number of children: Not on file  . Years of education: Not on file  . Highest education level: Not on file  Occupational History  . Occupation: Nurse     Employer: [employer]  Tobacco Use  . Smoking status: Never Smoker  . Smokeless tobacco: Never Used  Vaping Use  . Vaping Use: Never used  Substance and Sexual Activity  . Alcohol use: Yes    Comment: occ  . Drug use: No  . Sexual activity: Not Currently    Birth control/protection: Surgical  Other Topics Concern  . 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     Social Determinants of Health   Financial Resource Strain: Not on file  Food Insecurity: Not on file  Transportation Needs: Not on file  Physical Activity: Not on file  Stress: Not on file  Social Connections: Not on file  Intimate Partner Violence: Not on file    Past Surgical History:  Procedure Laterality Date  . ABDOMINAL HYSTERECTOMY    . ENDOMETRIAL ABLATION     Pelvic  . HOLMIUM LASER APPLICATION Right [id number]   Procedure: HOLMIUM LASER APPLICATION;  Surgeon: Festus Aloe, MD;  Location: [facility];  Service: Urology;  Laterality:  Right;  Marland Kitchen URETEROSCOPY WITH HOLMIUM LASER LITHOTRIPSY Right 11/15/2018   Procedure: CYSTOSCOPY, RETROGRADE, URETEROSCOPY WITH HOLMIUM LASER LITHOTRIPSY/STENT PLACEMENT;  Surgeon: Festus Aloe, MD;  Location: [facility];  Service: Urology;  Laterality: Right;  Marland Kitchen VAGINAL HYSTERECTOMY  2007   Partial  . WISDOM TOOTH EXTRACTION      Family History  Problem Relation Age of Onset  . Diabetes Mother        Vickki Muff (recent diagnosis) causes thrombocytosis  . Heart attack Mother 59  . Heart disease Mother   . Hypertension Father   . COPD Father   . Bipolar disorder Father   . Parkinson's disease Father   . Cervical cancer Sister   . Alcohol abuse Brother   . COPD Brother   . Hyperlipidemia Brother   . Hypertension Brother   . Stroke Brother   . Heart attack Brother   . COPD Maternal Grandmother   .  Hyperlipidemia Maternal Grandmother   . Heart attack Maternal Grandmother   . Depression Paternal Grandmother   . Heart disease Paternal Grandmother   . Heart attack Paternal Grandmother     Allergies  Allergen Reactions  . Cephalexin Nausea Only and Rash    Current Outpatient Medications on File Prior to Visit  Medication Sig Dispense Refill  . atorvastatin (LIPITOR) 20 MG tablet Take 1 tablet (20 mg total) by mouth daily at 6 PM. 90 tablet 1  . fenofibrate (TRICOR) 145 MG tablet Take 1 tablet (145 mg total) by mouth daily. 90 tablet 1  . hydrOXYzine (ATARAX/VISTARIL) 25 MG tablet Take 1 tablet (25 mg total) by mouth 3 (three) times daily as needed. 30 tablet 3  . indapamide (LOZOL) 1.25 MG tablet Take 1.25 mg by mouth daily.     . Multiple Vitamins-Minerals (MULTIVITAMIN PO) Take 1 tablet by mouth daily.     . Omega-3 Fatty Acids (FISH OIL) 1000 MG CAPS Take 2 capsules (2,000 mg total) by mouth 2 (two) times daily.  0   No current facility-administered medications on file prior to visit.    BP 108/63 (BP Location: Right Arm, Patient Position: Sitting, Cuff Size: Large)   Pulse 81   Temp 98.6 F (37 C) (Oral)   Resp 16   Ht 5\' 2"  (1.575 m)   Wt 215 lb (97.5 kg)   SpO2 98%   BMI 39.32 kg/m       Objective:   Physical Exam Constitutional:      Appearance: She is well-developed and well-nourished.  Neck:     Thyroid: No thyromegaly.  Cardiovascular:     Rate and Rhythm: Normal rate and regular rhythm.     Heart sounds: Normal heart sounds. No murmur heard.   Pulmonary:     Effort: Pulmonary effort is normal. No respiratory distress.     Breath sounds: Normal breath sounds. No wheezing.  Abdominal:     Palpations: Abdomen is soft.     Tenderness: There is abdominal tenderness in the right upper quadrant. There is no guarding.  Musculoskeletal:     Cervical back: Neck supple.  Skin:    General: Skin is warm and dry.  Neurological:     Mental Status: She is alert  and oriented to person, place, and time.  Psychiatric:        Mood and Affect: Mood and affect normal.        Behavior: Behavior normal.        Thought Content: Thought content normal.  Judgment: Judgment normal.           Assessment & Plan:  Cholecystitis- clinical hx and exam most consistent with cholecystitis.  Will refer for HIDA scan and check follow up LFT while we wait on her surgical consult in the end of January. She knows to go to the ER if she develops severe/worsening pain.  Renal insufficiency-  Advised pt to avoid NSAIDS.  Will repeat BMET today. It could have been residual renal insufficiency from recent passage of kidney stones.    This visit occurred during the SARS-CoV-2 public health emergency.  Safety protocols were in place, including screening questions prior to the visit, additional usage of staff PPE, and extensive cleaning of exam room while observing appropriate contact time as indicated for disinfecting solutions.

## 2020-09-29 NOTE — Telephone Encounter (Signed)
Called office , phone room states fax will be sent by the end of day.

## 2020-09-29 NOTE — Telephone Encounter (Signed)
Please call Dr. Vanessa Kick and request copy of most recent pap?

## 2020-10-06 MED FILL — hydrOXYzine HCL 25 MG TABS: 25 | 10 days supply | Qty: 30 | Fill #2

## 2020-10-06 MED FILL — ATORVASTATIN CALCIUM 20 MG: 20 | 30 days supply | Qty: 30 | Fill #4

## 2020-10-06 MED FILL — FENOFIBRATE 145 MG TABS: 145 | 30 days supply | Qty: 30 | Fill #4

## 2020-10-06 MED FILL — INDAPAMIDE 1.25 MG TABS: 1.25 | 30 days supply | Qty: 30 | Fill #2

## 2020-10-22 ENCOUNTER — Encounter (HOSPITAL_COMMUNITY): Payer: 59

## 2020-10-28 ENCOUNTER — Other Ambulatory Visit: Payer: Self-pay

## 2020-10-28 ENCOUNTER — Ambulatory Visit (HOSPITAL_COMMUNITY)
Admission: RE | Admit: 2020-10-28 | Discharge: 2020-10-28 | Disposition: A | Discharge status: Home / Self Care | Payer: 59 | Source: Ambulatory Visit | Attending: Family | Admitting: Family

## 2020-10-28 ENCOUNTER — Telehealth: Payer: Self-pay | Admitting: Family

## 2020-10-28 DIAGNOSIS — R1011 Right upper quadrant pain: Secondary | ICD-10-CM | POA: Diagnosis not present

## 2020-10-28 MED ORDER — TECHNETIUM TC 99M MEBROFENIN IV KIT
5.4000 | PACK | Freq: Once | INTRAVENOUS | Status: AC | PRN
  Administered 2020-10-28: 5.4 via INTRAVENOUS

## 2020-10-28 NOTE — Telephone Encounter (Signed)
See mychart.  

## 2020-11-02 ENCOUNTER — Other Ambulatory Visit: Payer: Self-pay | Admitting: Surgery

## 2020-11-03 MED FILL — ATORVASTATIN CALCIUM 20 MG: 20 | 30 days supply | Qty: 30 | Fill #5

## 2020-11-03 MED FILL — hydrOXYzine HCL 25 MG TABS: 25 | 10 days supply | Qty: 30 | Fill #3

## 2020-11-03 MED FILL — FENOFIBRATE 145 MG TABS: 145 | 30 days supply | Qty: 30 | Fill #5

## 2020-11-03 MED FILL — INDAPAMIDE 1.25 MG TABLET: 1.25 | 30 days supply | Qty: 30 | Fill #3

## 2020-11-10 ENCOUNTER — Encounter (HOSPITAL_BASED_OUTPATIENT_CLINIC_OR_DEPARTMENT_OTHER): Payer: Self-pay | Admitting: Surgery

## 2020-11-10 ENCOUNTER — Encounter (HOSPITAL_BASED_OUTPATIENT_CLINIC_OR_DEPARTMENT_OTHER)
Admission: RE | Admit: 2020-11-10 | Discharge: 2020-11-10 | Disposition: A | Discharge status: Home / Self Care | Payer: 59 | Source: Ambulatory Visit | Attending: Surgery | Admitting: Surgery

## 2020-11-10 DIAGNOSIS — Z01812 Encounter for preprocedural laboratory examination: Secondary | ICD-10-CM | POA: Insufficient documentation

## 2020-11-10 LAB — BASIC METABOLIC PANEL
Anion gap: 12 (ref 5–15)
BUN: 24 mg/dL — ABNORMAL HIGH (ref 6–20)
CO2: 25 mmol/L (ref 22–32)
Calcium: 9.5 mg/dL (ref 8.9–10.3)
Chloride: 105 mmol/L (ref 98–111)
Creatinine, Ser: 1.37 mg/dL — ABNORMAL HIGH (ref 0.44–1.00)
GFR, Estimated: 47 mL/min — ABNORMAL LOW (ref >60)
Glucose, Bld: 111 mg/dL — ABNORMAL HIGH (ref 70–99)
Potassium: 3.7 mmol/L (ref 3.5–5.1)
Sodium: 142 mmol/L (ref 135–145)

## 2020-11-10 MED ORDER — CHLORHEXIDINE GLUCONATE CLOTH 2 % EX PADS: 6.0000 | MEDICATED_PAD | Freq: Once | CUTANEOUS | Status: DC

## 2020-11-10 NOTE — Progress Notes (Signed)

## 2020-11-15 ENCOUNTER — Other Ambulatory Visit (HOSPITAL_COMMUNITY)
Admission: RE | Admit: 2020-11-15 | Discharge: 2020-11-15 | Disposition: A | Discharge status: Home / Self Care | Payer: 59 | Source: Ambulatory Visit | Attending: Surgery | Admitting: Surgery

## 2020-11-15 DIAGNOSIS — Z01812 Encounter for preprocedural laboratory examination: Secondary | ICD-10-CM | POA: Insufficient documentation

## 2020-11-15 DIAGNOSIS — Z20822 Contact with and (suspected) exposure to covid-19: Secondary | ICD-10-CM | POA: Diagnosis not present

## 2020-11-16 LAB — SARS CORONAVIRUS 2 (TAT 6-24 HRS): SARS Coronavirus 2: NEGATIVE

## 2020-11-17 NOTE — H&P (Signed)
Jane Montes Appointment: 11/02/2020 11:00 AM Location: [facility] Patient #: [id number] DOB: 1971/08/10 Married / Language: Jane Montes / Race: White Female   History of Present Illness (Chakira Jachim A. Ninfa Linden MD; 11/02/2020 11:08 AM) The patient is a 50 year old female who presents with abdominal pain.   Chief complaint: Right upper quadrant abdominal pain  This is a 50 year old female is presenting with a 6 month history of worsening right upper quadrant abdominal pain. She will have nausea and bloating after fatty meals. The pain is sharp and moderate to severe. She had an ultrasound showing a contracted gallbladder. Stones could not be visualized. She had a HIDA scan showing a 55% gallbladder ejection fraction. She is otherwise without complaints. She has no history of ulcer disease. She does have history of kidney stones. Bowel movements are normal.   Past Surgical History Malachi Bonds, CMA; 11/02/2020 10:51 AM) Hysterectomy (not due to cancer) - Partial  Oral Surgery   Diagnostic Studies History Malachi Bonds, CMA; 11/02/2020 10:51 AM) Colonoscopy  never Mammogram  1-3 years ago Pap Smear  1-5 years ago  Allergies Malachi Bonds, CMA; 11/02/2020 10:52 AM) Keflex *CEPHALOSPORINS*   Medication History Malachi Bonds, CMA; 11/02/2020 10:52 AM) Atorvastatin Calcium (20MG  Tablet, Oral) Active. Fenofibrate (145MG  Tablet, Oral) Active. Indapamide (1.25MG  Tablet, Oral) Active. Medications Reconciled  Social History Malachi Bonds, CMA; 11/02/2020 10:51 AM) Alcohol use  Occasional alcohol use. Caffeine use  Carbonated beverages, Coffee, Tea. No drug use  Tobacco use  Never smoker.  Family History Malachi Bonds, CMA; 11/02/2020 10:51 AM) Alcohol Abuse  Brother. Bleeding disorder  Mother. Cerebrovascular Accident  Brother. Cervical Cancer  Sister. Depression  Father. Diabetes Mellitus  Mother, Sister. Heart Disease  Brother, Mother,  Sister. Heart disease in female family member before age 29  Heart disease in female family member before age 4  Hypertension  Brother, Sister. Kidney Disease  Father. Migraine Headache  Brother, Sister. Ovarian Cancer  Sister.  Pregnancy / Birth History Malachi Bonds, CMA; 11/02/2020 10:51 AM) Age at menarche  14 years. Contraceptive History  Oral contraceptives. Gravida  2 Irregular periods  Length (months) of breastfeeding  7-12 Maternal age  50-25 Para  2  Other Problems Malachi Bonds, CMA; 11/02/2020 10:51 AM) Gastroesophageal Reflux Disease  Hypercholesterolemia  Kidney Stone  Migraine Headache  Oophorectomy     Review of Systems Malachi Bonds CMA; 11/02/2020 10:51 AM) General Present- Fatigue, Night Sweats and Weight Gain. Not Present- Appetite Loss, Chills, Fever and Weight Loss. Skin Not Present- Change in Wart/Mole, Dryness, Hives, Jaundice, New Lesions, Non-Healing Wounds, Rash and Ulcer. HEENT Present- Wears glasses/contact lenses. Not Present- Earache, Hearing Loss, Hoarseness, Nose Bleed, Oral Ulcers, Ringing in the Ears, Seasonal Allergies, Sinus Pain, Sore Throat, Visual Disturbances and Yellow Eyes. Respiratory Present- Snoring. Not Present- Bloody sputum, Chronic Cough, Difficulty Breathing and Wheezing. Breast Not Present- Breast Mass, Breast Pain, Nipple Discharge and Skin Changes. Cardiovascular Not Present- Chest Pain, Difficulty Breathing Lying Down, Leg Cramps, Palpitations, Rapid Heart Rate, Shortness of Breath and Swelling of Extremities. Gastrointestinal Present- Abdominal Pain, Bloating, Excessive gas and Nausea. Not Present- Bloody Stool, Change in Bowel Habits, Chronic diarrhea, Constipation, Difficulty Swallowing, Gets full quickly at meals, Hemorrhoids, Indigestion, Rectal Pain and Vomiting. Female Genitourinary Not Present- Frequency, Nocturia, Painful Urination, Pelvic Pain and Urgency. Musculoskeletal Present- Joint Stiffness. Not  Present- Back Pain, Joint Pain, Muscle Pain, Muscle Weakness and Swelling of Extremities. Neurological Not Present- Decreased Memory, Fainting, Headaches, Numbness, Seizures, Tingling, Tremor, Trouble walking and  Weakness. Psychiatric Not Present- Anxiety, Bipolar, Change in Sleep Pattern, Depression, Fearful and Frequent crying. Endocrine Present- Hot flashes. Not Present- Cold Intolerance, Excessive Hunger, Hair Changes, Heat Intolerance and New Diabetes. Hematology Not Present- Blood Thinners, Easy Bruising, Excessive bleeding, Gland problems, HIV and Persistent Infections.  Vitals (Chemira Jones CMA; 11/02/2020 10:52 AM) 11/02/2020 10:52 AM Weight: 212.8 lb Height: 62in Body Surface Area: 1.96 m Body Mass Index: 38.92 kg/m  Pulse: 97 (Regular)        Physical Exam (Caitlinn Klinker A. Ninfa Linden MD; 11/02/2020 11:08 AM) The physical exam findings are as follows: Note: She appears well and exam  Her abdomen is soft. There is some tenderness to deep palpation with guarding in the right upper quadrant  There is no hepatomegaly    Assessment & Plan (Yaneliz Radebaugh A. Ninfa Linden MD; 11/02/2020 11:09 AM) BILIARY DYSKINESIA (K82.8) CHRONIC CHOLECYSTITIS (K81.1) Impression: I reviewed her notes in the electronic medical records. I reviewed her x-ray data including her ultrasound and HIDA scan. I believe she has bili dyskinesia with chronic cholecystitis based on the ultrasound, symptoms, and her exam. We discussed gallbladder disease in detail. We discussed proceeding with a laparoscopic cholecystectomy. I discussed the procedure in detail. The patient was given Neurosurgeon. We discussed the risks and benefits of a laparoscopic cholecystectomy and possible cholangiogram including, but not limited to, bleeding, infection, injury to surrounding structures such as the intestine or liver, bile leak, retained gallstones, need to convert to an open procedure, prolonged diarrhea, blood clots such as  DVT, common bile duct injury, anesthesia risks, and possible need for additional procedures. The likelihood of improvement in symptoms and return to the patient's normal status is good. We discussed the typical post-operative recovery course. All questions were answered.

## 2020-11-18 ENCOUNTER — Ambulatory Visit (HOSPITAL_BASED_OUTPATIENT_CLINIC_OR_DEPARTMENT_OTHER): Payer: 59 | Admitting: Anesthesiology

## 2020-11-18 ENCOUNTER — Other Ambulatory Visit (HOSPITAL_BASED_OUTPATIENT_CLINIC_OR_DEPARTMENT_OTHER): Payer: Self-pay | Admitting: Surgery

## 2020-11-18 ENCOUNTER — Encounter (HOSPITAL_BASED_OUTPATIENT_CLINIC_OR_DEPARTMENT_OTHER)
Admission: RE | Disposition: A | Discharge status: Home / Self Care | Payer: Self-pay | Source: Home / Self Care | Attending: Surgery

## 2020-11-18 ENCOUNTER — Other Ambulatory Visit: Payer: Self-pay

## 2020-11-18 ENCOUNTER — Encounter (HOSPITAL_BASED_OUTPATIENT_CLINIC_OR_DEPARTMENT_OTHER): Payer: Self-pay | Admitting: Surgery

## 2020-11-18 ENCOUNTER — Ambulatory Visit (HOSPITAL_BASED_OUTPATIENT_CLINIC_OR_DEPARTMENT_OTHER)
Admission: RE | Admit: 2020-11-18 | Discharge: 2020-11-18 | Disposition: A | Discharge status: Home / Self Care | Payer: 59 | Attending: Surgery | Admitting: Surgery

## 2020-11-18 DIAGNOSIS — Z79899 Other long term (current) drug therapy: Secondary | ICD-10-CM | POA: Insufficient documentation

## 2020-11-18 DIAGNOSIS — K811 Chronic cholecystitis: Secondary | ICD-10-CM | POA: Insufficient documentation

## 2020-11-18 DIAGNOSIS — K828 Other specified diseases of gallbladder: Secondary | ICD-10-CM | POA: Diagnosis present

## 2020-11-18 HISTORY — DX: Personal history of urinary calculi: Z87.442

## 2020-11-18 HISTORY — PX: CHOLECYSTECTOMY: SHX55

## 2020-11-18 HISTORY — DX: Anxiety disorder, unspecified: F41.9

## 2020-11-18 SURGERY — LAPAROSCOPIC CHOLECYSTECTOMY
Anesthesia: General | Site: Abdomen

## 2020-11-18 MED ORDER — ROCURONIUM BROMIDE 100 MG/10ML IV SOLN
INTRAVENOUS | Status: DC | PRN
  Administered 2020-11-18: 100 mg via INTRAVENOUS

## 2020-11-18 MED ORDER — FENTANYL CITRATE (PF) 100 MCG/2ML IJ SOLN
INTRAMUSCULAR | Status: AC
  Filled 2020-11-18: qty 2

## 2020-11-18 MED ORDER — LACTATED RINGERS IV SOLN: INTRAVENOUS | Status: DC

## 2020-11-18 MED ORDER — PROMETHAZINE HCL 25 MG/ML IJ SOLN
6.2500 mg | INTRAMUSCULAR | Status: DC | PRN
  Administered 2020-11-18: 6.25 mg via INTRAVENOUS

## 2020-11-18 MED ORDER — SODIUM CHLORIDE 0.9 % IR SOLN
Status: DC | PRN
  Administered 2020-11-18: 1000 mL

## 2020-11-18 MED ORDER — EPHEDRINE 5 MG/ML INJ
INTRAVENOUS | Status: AC
  Filled 2020-11-18: qty 10

## 2020-11-18 MED ORDER — LIDOCAINE HCL (CARDIAC) PF 100 MG/5ML IV SOSY
PREFILLED_SYRINGE | INTRAVENOUS | Status: DC | PRN
  Administered 2020-11-18: 100 mg via INTRAVENOUS

## 2020-11-18 MED ORDER — CELECOXIB 200 MG PO CAPS
ORAL_CAPSULE | ORAL | Status: AC
  Filled 2020-11-18: qty 2

## 2020-11-18 MED ORDER — OXYCODONE HCL 5 MG PO TABS
5.0000 mg | ORAL_TABLET | Freq: Once | ORAL | Status: DC | PRN
Start: 2020-11-18 — End: 2020-11-18

## 2020-11-18 MED ORDER — OXYCODONE HCL 5 MG PO TABS: 5.0000 mg | ORAL_TABLET | Freq: Four times a day (QID) | ORAL | 0 refills | Status: DC | PRN

## 2020-11-18 MED ORDER — SUGAMMADEX SODIUM 200 MG/2ML IV SOLN
INTRAVENOUS | Status: DC | PRN
  Administered 2020-11-18: 400 mg via INTRAVENOUS

## 2020-11-18 MED ORDER — CELECOXIB 200 MG PO CAPS
400.0000 mg | ORAL_CAPSULE | ORAL | Status: AC
  Administered 2020-11-18: 400 mg via ORAL

## 2020-11-18 MED ORDER — ACETAMINOPHEN 500 MG PO TABS
1000.0000 mg | ORAL_TABLET | ORAL | Status: AC
  Administered 2020-11-18: 1000 mg via ORAL

## 2020-11-18 MED ORDER — PHENYLEPHRINE 40 MCG/ML (10ML) SYRINGE FOR IV PUSH (FOR BLOOD PRESSURE SUPPORT)
PREFILLED_SYRINGE | INTRAVENOUS | Status: AC
  Filled 2020-11-18: qty 10

## 2020-11-18 MED ORDER — CEFAZOLIN SODIUM-DEXTROSE 2-4 GM/100ML-% IV SOLN
INTRAVENOUS | Status: AC
  Filled 2020-11-18: qty 100

## 2020-11-18 MED ORDER — AMISULPRIDE (ANTIEMETIC) 5 MG/2ML IV SOLN
INTRAVENOUS | Status: AC
  Filled 2020-11-18: qty 4

## 2020-11-18 MED ORDER — DEXAMETHASONE SODIUM PHOSPHATE 4 MG/ML IJ SOLN
INTRAMUSCULAR | Status: DC | PRN
  Administered 2020-11-18: 10 mg via INTRAVENOUS

## 2020-11-18 MED ORDER — DIPHENHYDRAMINE HCL 50 MG/ML IJ SOLN
INTRAMUSCULAR | Status: DC | PRN
  Administered 2020-11-18: 6.25 mg via INTRAVENOUS

## 2020-11-18 MED ORDER — MEPERIDINE HCL 25 MG/ML IJ SOLN: 6.2500 mg | INTRAMUSCULAR | Status: DC | PRN

## 2020-11-18 MED ORDER — BUPIVACAINE-EPINEPHRINE (PF) 0.5% -1:200000 IJ SOLN
INTRAMUSCULAR | Status: DC | PRN
  Administered 2020-11-18: 20 mL

## 2020-11-18 MED ORDER — PROPOFOL 10 MG/ML IV BOLUS
INTRAVENOUS | Status: DC | PRN
  Administered 2020-11-18: 150 mg via INTRAVENOUS

## 2020-11-18 MED ORDER — SUCCINYLCHOLINE CHLORIDE 200 MG/10ML IV SOSY
PREFILLED_SYRINGE | INTRAVENOUS | Status: AC
  Filled 2020-11-18: qty 10

## 2020-11-18 MED ORDER — OXYCODONE HCL 5 MG/5ML PO SOLN: 5.0000 mg | Freq: Once | ORAL | Status: DC | PRN

## 2020-11-18 MED ORDER — HYDROMORPHONE HCL 1 MG/ML IJ SOLN
0.2500 mg | INTRAMUSCULAR | Status: DC | PRN
  Administered 2020-11-18 (×2): 0.25 mg via INTRAVENOUS

## 2020-11-18 MED ORDER — PROMETHAZINE HCL 25 MG/ML IJ SOLN: 6.2500 mg | INTRAMUSCULAR | Status: DC | PRN

## 2020-11-18 MED ORDER — GABAPENTIN 300 MG PO CAPS
ORAL_CAPSULE | ORAL | Status: AC
  Filled 2020-11-18: qty 1

## 2020-11-18 MED ORDER — ONDANSETRON HCL 4 MG/2ML IJ SOLN
INTRAMUSCULAR | Status: DC | PRN
  Administered 2020-11-18: 4 mg via INTRAVENOUS

## 2020-11-18 MED ORDER — SUGAMMADEX SODIUM 200 MG/2ML IV SOLN: INTRAVENOUS | Status: DC | PRN

## 2020-11-18 MED ORDER — ONDANSETRON HCL 4 MG/2ML IJ SOLN
INTRAMUSCULAR | Status: AC
  Filled 2020-11-18: qty 2

## 2020-11-18 MED ORDER — HYDROMORPHONE HCL 1 MG/ML IJ SOLN
INTRAMUSCULAR | Status: AC
  Filled 2020-11-18: qty 0.5

## 2020-11-18 MED ORDER — MIDAZOLAM HCL 2 MG/2ML IJ SOLN
INTRAMUSCULAR | Status: AC
  Filled 2020-11-18: qty 2

## 2020-11-18 MED ORDER — ACETAMINOPHEN 500 MG PO TABS
ORAL_TABLET | ORAL | Status: AC
  Filled 2020-11-18: qty 2

## 2020-11-18 MED ORDER — SUGAMMADEX SODIUM 500 MG/5ML IV SOLN
INTRAVENOUS | Status: AC
  Filled 2020-11-18: qty 5

## 2020-11-18 MED ORDER — GABAPENTIN 300 MG PO CAPS
300.0000 mg | ORAL_CAPSULE | ORAL | Status: AC
  Administered 2020-11-18: 300 mg via ORAL

## 2020-11-18 MED ORDER — MIDAZOLAM HCL 5 MG/5ML IJ SOLN
INTRAMUSCULAR | Status: DC | PRN
  Administered 2020-11-18: 2 mg via INTRAVENOUS

## 2020-11-18 MED ORDER — CIPROFLOXACIN IN D5W 400 MG/200ML IV SOLN
400.0000 mg | INTRAVENOUS | Status: AC
  Administered 2020-11-18: 400 mg via INTRAVENOUS

## 2020-11-18 MED ORDER — LIDOCAINE 2% (20 MG/ML) 5 ML SYRINGE
INTRAMUSCULAR | Status: AC
  Filled 2020-11-18: qty 5

## 2020-11-18 MED ORDER — PROMETHAZINE HCL 25 MG/ML IJ SOLN
INTRAMUSCULAR | Status: AC
  Filled 2020-11-18: qty 1

## 2020-11-18 MED ORDER — FENTANYL CITRATE (PF) 100 MCG/2ML IJ SOLN
INTRAMUSCULAR | Status: DC | PRN
  Administered 2020-11-18: 100 ug via INTRAVENOUS
  Administered 2020-11-18 (×2): 50 ug via INTRAVENOUS

## 2020-11-18 MED ORDER — DIPHENHYDRAMINE HCL 50 MG/ML IJ SOLN
INTRAMUSCULAR | Status: AC
  Filled 2020-11-18: qty 1

## 2020-11-18 MED ORDER — CIPROFLOXACIN IN D5W 400 MG/200ML IV SOLN
INTRAVENOUS | Status: AC
  Filled 2020-11-18: qty 200

## 2020-11-18 MED ORDER — DEXAMETHASONE SODIUM PHOSPHATE 10 MG/ML IJ SOLN
INTRAMUSCULAR | Status: AC
  Filled 2020-11-18: qty 1

## 2020-11-18 MED ORDER — MEPERIDINE HCL 25 MG/ML IJ SOLN
6.2500 mg | INTRAMUSCULAR | Status: DC | PRN
Start: 2020-11-18 — End: 2020-11-18

## 2020-11-18 MED ORDER — AMISULPRIDE (ANTIEMETIC) 5 MG/2ML IV SOLN
10.0000 mg | Freq: Once | INTRAVENOUS | Status: AC | PRN
  Administered 2020-11-18: 10 mg via INTRAVENOUS

## 2020-11-18 MED FILL — oxyCODONE HCL 5 MG TABS: 5 | 6 days supply | Qty: 25 | Fill #0

## 2020-11-18 SURGICAL SUPPLY — 41 items
ADH SKN CLS APL DERMABOND .7 (GAUZE/BANDAGES/DRESSINGS) ×1
APL PRP STRL LF DISP 70% ISPRP (MISCELLANEOUS) ×1
APPLIER CLIP 5 13 M/L LIGAMAX5 (MISCELLANEOUS) ×2
APR CLP MED LRG 5 ANG JAW (MISCELLANEOUS) ×1
BAG SPEC RTRVL LRG 6X4 10 (ENDOMECHANICALS) ×1
BLADE CLIPPER SURG (BLADE) IMPLANT
CHLORAPREP W/TINT 26 (MISCELLANEOUS) ×2 IMPLANT
CLIP APPLIE 5 13 M/L LIGAMAX5 (MISCELLANEOUS) ×1 IMPLANT
COVER MAYO STAND STRL (DRAPES) IMPLANT
COVER WAND RF STERILE (DRAPES) IMPLANT
DECANTER SPIKE VIAL GLASS SM (MISCELLANEOUS) ×1 IMPLANT
DERMABOND ADVANCED (GAUZE/BANDAGES/DRESSINGS) ×1
DERMABOND ADVANCED .7 DNX12 (GAUZE/BANDAGES/DRESSINGS) ×1 IMPLANT
DRAPE C-ARM 42X72 X-RAY (DRAPES) IMPLANT
DRAPE LAPAROSCOPIC ABDOMINAL (DRAPES) ×2 IMPLANT
ELECT REM PT RETURN 9FT ADLT (ELECTROSURGICAL) ×2
ELECTRODE REM PT RTRN 9FT ADLT (ELECTROSURGICAL) ×1 IMPLANT
GLOVE SURG SIGNA 7.5 PF LTX (GLOVE) ×2 IMPLANT
GLOVE SURG SYN 7.0 (GLOVE) ×2 IMPLANT
GLOVE SURG SYN 7.0 PF PI (GLOVE) IMPLANT
GLOVE SURG UNDER POLY LF SZ7 (GLOVE) ×2 IMPLANT
GOWN STRL REUS W/ TWL LRG LVL3 (GOWN DISPOSABLE) ×2 IMPLANT
GOWN STRL REUS W/ TWL XL LVL3 (GOWN DISPOSABLE) ×1 IMPLANT
GOWN STRL REUS W/TWL LRG LVL3 (GOWN DISPOSABLE) ×4
GOWN STRL REUS W/TWL XL LVL3 (GOWN DISPOSABLE) ×2
NS IRRIG 1000ML POUR BTL (IV SOLUTION) ×2 IMPLANT
PACK BASIN DAY SURGERY FS (CUSTOM PROCEDURE TRAY) ×2 IMPLANT
POUCH SPECIMEN RETRIEVAL 10MM (ENDOMECHANICALS) ×2 IMPLANT
SCISSORS LAP 5X35 DISP (ENDOMECHANICALS) ×2 IMPLANT
SET CHOLANGIOGRAPH 5 50 .035 (SET/KITS/TRAYS/PACK) IMPLANT
SET IRRIG TUBING LAPAROSCOPIC (IRRIGATION / IRRIGATOR) ×2 IMPLANT
SET TUBE SMOKE EVAC HIGH FLOW (TUBING) ×2 IMPLANT
SLEEVE ENDOPATH XCEL 5M (ENDOMECHANICALS) ×4 IMPLANT
SLEEVE SCD COMPRESS KNEE MED (MISCELLANEOUS) ×2 IMPLANT
SPECIMEN JAR SMALL (MISCELLANEOUS) ×2 IMPLANT
SUT MON AB 4-0 PC3 18 (SUTURE) ×2 IMPLANT
TOWEL GREEN STERILE FF (TOWEL DISPOSABLE) ×2 IMPLANT
TRAY LAPAROSCOPIC (CUSTOM PROCEDURE TRAY) ×2 IMPLANT
TROCAR XCEL BLUNT TIP 100MML (ENDOMECHANICALS) ×2 IMPLANT
TROCAR XCEL NON-BLD 5MMX100MML (ENDOMECHANICALS) ×2 IMPLANT
TUBE CONNECTING 20X1/4 (TUBING) ×2 IMPLANT

## 2020-11-18 NOTE — Interval H&P Note (Signed)
History and Physical Interval Note: no change in H and P  11/18/2020 10:04 AM  Jane Montes  has presented today for surgery, with the diagnosis of [place].  The various methods of treatment have been discussed with the patient and family. After consideration of risks, benefits and other options for treatment, the patient has consented to  Procedure(s): LAPAROSCOPIC CHOLECYSTECTOMY (N/A) as a surgical intervention.  The patient's history has been reviewed, patient examined, no change in status, stable for surgery.  I have reviewed the patient's chart and labs.  Questions were answered to the patient's satisfaction.     Coralie Keens

## 2020-11-18 NOTE — Op Note (Signed)
Laparoscopic Cholecystectomy Procedure Note  Indications: This patient presents with symptomatic gallbladder disease and will undergo laparoscopic cholecystectomy.  Pre-operative Diagnosis: biliary dyskinesia  Post-operative Diagnosis: Same  Surgeon: Coralie Keens   Assistants: 0  Anesthesia: General endotracheal anesthesia  ASA Class: 2  Procedure Details  The patient was seen again in the Holding Room. The risks, benefits, complications, treatment options, and expected outcomes were discussed with the patient. The possibilities of reaction to medication, pulmonary aspiration, perforation of viscus, bleeding, recurrent infection, finding a normal gallbladder, the need for additional procedures, failure to diagnose a condition, the possible need to convert to an open procedure, and creating a complication requiring transfusion or operation were discussed with the patient. The likelihood of improving the patient's symptoms with return to their baseline status is good.  The patient and/or family concurred with the proposed plan, giving informed consent. The site of surgery properly noted. The patient was taken to Operating Room, identified as Jane Montes and the procedure verified as Laparoscopic Cholecystectomy with Intraoperative Cholangiogram. A Time Out was held and the above information confirmed.  Prior to the induction of general anesthesia, antibiotic prophylaxis was administered. General endotracheal anesthesia was then administered and tolerated well. After the induction, the abdomen was prepped with Chloraprep and draped in sterile fashion. The patient was positioned in the supine position.  Local anesthetic agent was injected into the skin near the umbilicus and an incision made. We dissected down to the abdominal fascia with blunt dissection.  The fascia was incised vertically and we entered the peritoneal cavity bluntly.  A pursestring suture of 0-Vicryl was placed around the  fascial opening.  The Hasson cannula was inserted and secured with the stay suture.  Pneumoperitoneum was then created with CO2 and tolerated well without any adverse changes in the patient's vital signs. A 5-mm port was placed in the subxiphoid position.  Two 5-mm ports were placed in the right upper quadrant. All skin incisions were infiltrated with a local anesthetic agent before making the incision and placing the trocars.   We positioned the patient in reverse Trendelenburg, tilted slightly to the patient's left.  The liver was fatty in appearance. The gallbladder was identified, the fundus grasped and retracted cephalad.  The infundibulum was grasped and retracted laterally, exposing the peritoneum overlying the triangle of Calot. This was then divided and exposed in a blunt fashion. The cystic duct was clearly identified and bluntly dissected circumferentially. A critical view of the cystic duct and cystic artery was obtained.  The cystic duct was then ligated with clips and divided. The cystic artery was, dissected free, ligated with clips and divided as well.   The gallbladder was dissected from the liver bed in retrograde fashion with the electrocautery. The gallbladder was removed and placed in an Endocatch sac. The liver bed was irrigated and inspected. Hemostasis was achieved with the electrocautery. Copious irrigation was utilized and was repeatedly aspirated until clear.  The gallbladder and Endocatch sac were then removed through the umbilical port site.  The pursestring suture was used to close the umbilical fascia.    We again inspected the right upper quadrant for hemostasis.  Pneumoperitoneum was released as we removed the trocars.  4-0 Monocryl was used to close the skin.  Skin glue was then applied. The patient was then extubated and brought to the recovery room in stable condition. Instrument, sponge, and needle counts were correct at closure and at the conclusion of the case.    Findings: Mild  Cholecystitis without Cholelithiasis Fatty liver  Estimated Blood Loss: Minimal         Drains: 0         Specimens: Gallbladder           Complications: None; patient tolerated the procedure well.         Disposition: PACU - hemodynamically stable.         Condition: stable

## 2020-11-18 NOTE — Interval H&P Note (Signed)
History and Physical Interval Note: no change in H and pP  11/18/2020 11:15 AM  Jane Montes  has presented today for surgery, with the diagnosis of [place].  The various methods of treatment have been discussed with the patient and family. After consideration of risks, benefits and other options for treatment, the patient has consented to  Procedure(s): LAPAROSCOPIC CHOLECYSTECTOMY (N/A) as a surgical intervention.  The patient's history has been reviewed, patient examined, no change in status, stable for surgery.  I have reviewed the patient's chart and labs.  Questions were answered to the patient's satisfaction.     Coralie Keens

## 2020-11-18 NOTE — Anesthesia Preprocedure Evaluation (Signed)
Anesthesia Evaluation  Patient identified by MRN, date of birth, ID band Patient awake    Reviewed: Allergy & Precautions, NPO status , Patient's Chart, lab work & pertinent test results  Airway Mallampati: II  TM Distance: >3 FB Neck ROM: Full    Dental no notable dental hx. (+) Teeth Intact, Dental Advisory Given   Pulmonary neg pulmonary ROS,    Pulmonary exam normal breath sounds clear to auscultation       Cardiovascular Exercise Tolerance: Good hypertension, Pt. on medications negative cardio ROS Normal cardiovascular exam Rhythm:Regular Rate:Normal  Bp been Normal for 3 month and on no meds   Neuro/Psych Anxiety negative neurological ROS  negative psych ROS   GI/Hepatic GERD  ,  Endo/Other    Renal/GU Renal InsufficiencyRenal disease     Musculoskeletal   Abdominal (+) + obese,   Peds  Hematology   Anesthesia Other Findings   Reproductive/Obstetrics negative OB ROS                             Anesthesia Physical  Anesthesia Plan  ASA: II  Anesthesia Plan: General   Post-op Pain Management:    Induction: Intravenous  PONV Risk Score and Plan: 3 and Treatment may vary due to age or medical condition, Ondansetron, Dexamethasone and Midazolam  Airway Management Planned: Oral ETT  Additional Equipment:   Intra-op Plan:   Post-operative Plan: Extubation in OR  Informed Consent: I have reviewed the patients History and Physical, chart, labs and discussed the procedure including the risks, benefits and alternatives for the proposed anesthesia with the patient or authorized representative who has indicated his/her understanding and acceptance.     Dental advisory given  Plan Discussed with: CRNA  Anesthesia Plan Comments:         Anesthesia Quick Evaluation

## 2020-11-18 NOTE — Anesthesia Procedure Notes (Signed)

## 2020-11-18 NOTE — Discharge Instructions (Signed)
[facility] ______[facility] LAPAROSCOPIC SURGERY: POST OP INSTRUCTIONS Always review your discharge instruction sheet given to you by the facility where your surgery was performed. IF YOU HAVE DISABILITY OR FAMILY LEAVE FORMS, YOU MUST BRING THEM TO THE OFFICE FOR PROCESSING.   DO NOT GIVE THEM TO YOUR DOCTOR.  1. A prescription for pain medication may be given to you upon discharge.  Take your pain medication as prescribed, if needed.  If narcotic pain medicine is not needed, then you may take acetaminophen (Tylenol) or ibuprofen (Advil) as needed. 2. Take your usually prescribed medications unless otherwise directed. 3. If you need a refill on your pain medication, please contact your pharmacy.  They will contact our office to request authorization. Prescriptions will not be filled after 5pm or on week-ends. 4. You should follow a light diet the first few days after arrival home, such as soup and crackers, etc.  Be sure to include lots of fluids daily. 5. Most patients will experience some swelling and bruising in the area of the incisions.  Ice packs will help.  Swelling and bruising can take several days to resolve.  6. It is common to experience some constipation if taking pain medication after surgery.  Increasing fluid intake and taking a stool softener (such as Colace) will usually help or prevent this problem from occurring.  A mild laxative (Milk of Magnesia or Miralax) should be taken according to package instructions if there are no bowel movements after 48 hours. 7. Unless discharge instructions indicate otherwise, you may remove your bandages 24-48 hours after surgery, and you may shower at that time.  You may have steri-strips (small skin tapes) in place directly over the incision.  These strips should be left on the skin for 7-10 days.  If your surgeon used skin glue on the incision, you may shower in 24 hours.  The glue will flake off over the next 2-3 weeks.  Any sutures or  staples will be removed at the office during your follow-up visit. 8. ACTIVITIES:  You may resume regular (light) daily activities beginning the next day--such as daily self-care, walking, climbing stairs--gradually increasing activities as tolerated.  You may have sexual intercourse when it is comfortable.  Refrain from any heavy lifting or straining until approved by your doctor. a. You may drive when you are no longer taking prescription pain medication, you can comfortably wear a seatbelt, and you can safely maneuver your car and apply brakes. b. RETURN TO WORK:  __________________________________________________________ 9. You should see your doctor in the office for a follow-up appointment approximately 2-3 weeks after your surgery.  Make sure that you call for this appointment within a day or two after you arrive home to insure a convenient appointment time. 10. OTHER INSTRUCTIONS:NO LIFTING MORE THAN 15 POUNDS FOR 2 WEEKS 11. OK TO SHOWER STARTING TOMORROW 12. ICE PACK, TYLENOL, AND IBUPROFEN ALSO FOR PAIN __________________________________________________________________________________________________________________________ __________________________________________________________________________________________________________________________ WHEN TO CALL YOUR DOCTOR: 1. Fever over 101.0 2. Inability to urinate 3. Continued bleeding from incision. 4. Increased pain, redness, or drainage from the incision. 5. Increasing abdominal pain  The clinic staff is available to answer your questions during regular business hours.  Please dont hesitate to call and ask to speak to one of the nurses for clinical concerns.  If you have a medical emergency, go to the nearest emergency room or call 911.  A surgeon from [facility] is always on call at the hospital. [address] [place],  [place], [place]  [address] ? [address], [place], [place]   [phone] ?  [phone] ? FAX [phone] Web site: [email or web address]  NO TYLENOL OR IBUPROFEN BEFORE 4:15PM TODAY, IF NEEDED.  Post Anesthesia Home Care Instructions  Activity: Get plenty of rest for the remainder of the day. A responsible individual must stay with you for 24 hours following the procedure.  For the next 24 hours, DO NOT: -Drive a car -Paediatric nurse -Drink alcoholic beverages -Take any medication unless instructed by your physician -Make any legal decisions or sign important papers.  Meals: Start with liquid foods such as gelatin or soup. Progress to regular foods as tolerated. Avoid greasy, spicy, heavy foods. If nausea and/or vomiting occur, drink only clear liquids until the nausea and/or vomiting subsides. Call your physician if vomiting continues.  Special Instructions/Symptoms: Your throat may feel dry or sore from the anesthesia or the breathing tube placed in your throat during surgery. If this causes discomfort, gargle with warm salt water. The discomfort should disappear within 24 hours.  If you had a scopolamine patch placed behind your ear for the management of post- operative nausea and/or vomiting:  1. The medication in the patch is effective for 72 hours, after which it should be removed.  Wrap patch in a tissue and discard in the trash. Wash hands thoroughly with soap and water. 2. You may remove the patch earlier than 72 hours if you experience unpleasant side effects which may include dry mouth, dizziness or visual disturbances. 3. Avoid touching the patch. Wash your hands with soap and water after contact with the patch.

## 2020-11-18 NOTE — Transfer of Care (Signed)
Immediate Anesthesia Transfer of Care Note  Patient: PINKEY MCJUNKIN  Procedure(s) Performed: LAPAROSCOPIC CHOLECYSTECTOMY (N/A Abdomen)  Patient Location: PACU  Anesthesia Type:General  Level of Consciousness: sedated  Airway & Oxygen Therapy: Patient Spontanous Breathing and Patient connected to face mask oxygen  Post-op Assessment: Report given to RN and Post -op Vital signs reviewed and stable  Post vital signs: Reviewed and stable  Last Vitals:  Vitals Value Taken Time  BP    Temp    Pulse 89 11/18/20 1232  Resp 23 11/18/20 1232  SpO2 100 % 11/18/20 1232  Vitals shown include unvalidated device data.  Last Pain:  Vitals:   11/18/20 1006  TempSrc: Oral  PainSc: 5       Patients Stated Pain Goal: 5 (59/56/38 7564)  Complications: No complications documented.

## 2020-11-19 ENCOUNTER — Encounter (HOSPITAL_BASED_OUTPATIENT_CLINIC_OR_DEPARTMENT_OTHER): Payer: Self-pay | Admitting: Surgery

## 2020-11-19 LAB — SURGICAL PATHOLOGY

## 2020-11-19 NOTE — Anesthesia Postprocedure Evaluation (Signed)
Anesthesia Post Note  Patient: Jane Montes  Procedure(s) Performed: LAPAROSCOPIC CHOLECYSTECTOMY (N/A Abdomen)     Patient location during evaluation: PACU Anesthesia Type: General Level of consciousness: awake and alert Pain management: pain level controlled Vital Signs Assessment: post-procedure vital signs reviewed and stable Respiratory status: spontaneous breathing, nonlabored ventilation and respiratory function stable Cardiovascular status: blood pressure returned to baseline and stable Postop Assessment: no apparent nausea or vomiting Anesthetic complications: no   No complications documented.  Last Vitals:  Vitals:   11/18/20 1330 11/18/20 1345  BP: 134/86 95/71  Pulse: 74 81  Resp: 17 19  Temp:    SpO2: 100% 100%    Last Pain:  Vitals:   11/18/20 1408  TempSrc:   PainSc: Pierz

## 2020-12-07 ENCOUNTER — Other Ambulatory Visit: Payer: Self-pay | Admitting: Family

## 2020-12-07 ENCOUNTER — Other Ambulatory Visit: Payer: Self-pay | Admitting: Cardiovascular Disease

## 2020-12-07 MED FILL — ATORVASTATIN CALCIUM 20 MG: 20 | 30 days supply | Qty: 30 | Fill #0

## 2020-12-07 MED FILL — FENOFIBRATE 145 MG TABS: 145 | 30 days supply | Qty: 30 | Fill #0

## 2020-12-07 MED FILL — INDAPAMIDE 1.25 MG TABLET: 1.25 | 30 days supply | Qty: 30 | Fill #0

## 2020-12-07 MED FILL — hydrOXYzine HCL 25 MG TABS: 25 | 10 days supply | Qty: 30 | Fill #0

## 2021-01-08 ENCOUNTER — Other Ambulatory Visit (HOSPITAL_COMMUNITY): Payer: Self-pay

## 2021-01-11 ENCOUNTER — Other Ambulatory Visit (HOSPITAL_COMMUNITY): Payer: Self-pay

## 2021-01-11 MED FILL — Fenofibrate Tab 145 MG: ORAL | 30 days supply | Qty: 30 | Fill #0 | Status: AC

## 2021-01-11 MED FILL — Atorvastatin Calcium Tab 20 MG (Base Equivalent): ORAL | 30 days supply | Qty: 30 | Fill #0 | Status: AC

## 2021-01-11 MED FILL — Hydroxyzine HCl Tab 25 MG: ORAL | 30 days supply | Qty: 30 | Fill #0 | Status: AC

## 2021-01-11 MED FILL — Indapamide Tab 1.25 MG: ORAL | 30 days supply | Qty: 30 | Fill #0 | Status: AC

## 2021-02-04 ENCOUNTER — Other Ambulatory Visit (HOSPITAL_COMMUNITY): Payer: Self-pay

## 2021-02-04 MED FILL — Indapamide Tab 1.25 MG: ORAL | 10 days supply | Qty: 10 | Fill #1 | Status: AC

## 2021-02-04 MED FILL — Fenofibrate Tab 145 MG: ORAL | 30 days supply | Qty: 30 | Fill #1 | Status: AC

## 2021-02-04 MED FILL — Indapamide Tab 1.25 MG: ORAL | 20 days supply | Qty: 20 | Fill #1 | Status: AC

## 2021-02-04 MED FILL — Atorvastatin Calcium Tab 20 MG (Base Equivalent): ORAL | 30 days supply | Qty: 30 | Fill #1 | Status: AC

## 2021-02-28 ENCOUNTER — Other Ambulatory Visit (HOSPITAL_BASED_OUTPATIENT_CLINIC_OR_DEPARTMENT_OTHER): Payer: Self-pay

## 2021-03-10 ENCOUNTER — Other Ambulatory Visit: Payer: Self-pay | Admitting: Cardiovascular Disease

## 2021-03-10 ENCOUNTER — Other Ambulatory Visit (HOSPITAL_COMMUNITY): Payer: Self-pay

## 2021-03-10 MED ORDER — FENOFIBRATE 145 MG PO TABS
145.0000 mg | ORAL_TABLET | Freq: Every day | ORAL | 0 refills | Status: DC
  Filled 2021-03-10: qty 30, 30d supply, fill #0

## 2021-03-10 MED FILL — Atorvastatin Calcium Tab 20 MG (Base Equivalent): ORAL | 30 days supply | Qty: 30 | Fill #2 | Status: AC

## 2021-03-10 MED FILL — Hydroxyzine HCl Tab 25 MG: ORAL | 10 days supply | Qty: 30 | Fill #1 | Status: AC

## 2021-03-10 MED FILL — Indapamide Tab 1.25 MG: ORAL | 30 days supply | Qty: 30 | Fill #2 | Status: AC

## 2021-03-30 ENCOUNTER — Encounter: Payer: Self-pay | Admitting: Family

## 2021-03-30 ENCOUNTER — Other Ambulatory Visit (HOSPITAL_COMMUNITY): Payer: Self-pay

## 2021-03-30 ENCOUNTER — Other Ambulatory Visit: Payer: Self-pay

## 2021-03-30 ENCOUNTER — Ambulatory Visit (INDEPENDENT_AMBULATORY_CARE_PROVIDER_SITE_OTHER): Payer: 59 | Admitting: Family

## 2021-03-30 VITALS — BP 127/83 | HR 82 | Ht 62.0 in | Wt 213.4 lb

## 2021-03-30 DIAGNOSIS — E781 Pure hyperglyceridemia: Secondary | ICD-10-CM

## 2021-03-30 DIAGNOSIS — F419 Anxiety disorder, unspecified: Secondary | ICD-10-CM

## 2021-03-30 DIAGNOSIS — Z0000 Encounter for general adult medical examination without abnormal findings: Secondary | ICD-10-CM

## 2021-03-30 DIAGNOSIS — N200 Calculus of kidney: Secondary | ICD-10-CM | POA: Diagnosis not present

## 2021-03-30 LAB — COMPREHENSIVE METABOLIC PANEL
ALT: 24 U/L (ref 0–35)
AST: 20 U/L (ref 0–37)
Albumin: 4.4 g/dL (ref 3.5–5.2)
Alkaline Phosphatase: 73 U/L (ref 39–117)
BUN: 23 mg/dL (ref 6–23)
CO2: 28 mEq/L (ref 19–32)
Calcium: 10 mg/dL (ref 8.4–10.5)
Chloride: 103 mEq/L (ref 96–112)
Creatinine, Ser: 1.19 mg/dL (ref 0.40–1.20)
GFR: 53.54 mL/min — ABNORMAL LOW (ref >60.00)
Glucose, Bld: 111 mg/dL — ABNORMAL HIGH (ref 70–99)
Potassium: 4.4 mEq/L (ref 3.5–5.1)
Sodium: 139 mEq/L (ref 135–145)
Total Bilirubin: 0.4 mg/dL (ref 0.2–1.2)
Total Protein: 6.8 g/dL (ref 6.0–8.3)

## 2021-03-30 LAB — LIPID PANEL
Cholesterol: 154 mg/dL (ref 0–200)
HDL: 42.9 mg/dL (ref >39.00)
NonHDL: 111.24
Total CHOL/HDL Ratio: 4
Triglycerides: 204 mg/dL — ABNORMAL HIGH (ref 0.0–149.0)
VLDL: 40.8 mg/dL — ABNORMAL HIGH (ref 0.0–40.0)

## 2021-03-30 LAB — LDL CHOLESTEROL, DIRECT: Direct LDL: 87 mg/dL

## 2021-03-30 MED ORDER — FENOFIBRATE 145 MG PO TABS
145.0000 mg | ORAL_TABLET | Freq: Every day | ORAL | 1 refills | Status: DC
  Filled 2021-03-30: qty 30, 30d supply, fill #0
  Filled 2021-05-12: qty 30, 30d supply, fill #1
  Filled 2021-06-09: qty 30, 30d supply, fill #2
  Filled 2021-07-12: qty 30, 30d supply, fill #3
  Filled 2021-08-16: qty 30, 30d supply, fill #4
  Filled 2021-09-13: qty 30, 30d supply, fill #5

## 2021-03-30 MED ORDER — ATORVASTATIN CALCIUM 20 MG PO TABS
20.0000 mg | ORAL_TABLET | Freq: Every day | ORAL | 1 refills | Status: DC
  Filled 2021-03-30: qty 30, 30d supply, fill #0
  Filled 2021-05-12: qty 30, 30d supply, fill #1
  Filled 2021-06-09: qty 30, 30d supply, fill #2
  Filled 2021-07-12: qty 30, 30d supply, fill #3
  Filled 2021-08-16: qty 30, 30d supply, fill #4
  Filled 2021-09-13: qty 30, 30d supply, fill #5

## 2021-03-30 NOTE — Assessment & Plan Note (Addendum)
Check lipid panel, continue fenofibrate, atorvastatin 40mg .

## 2021-03-30 NOTE — Assessment & Plan Note (Signed)
Didn't like the way she felt on cymbalta.  She is managing well overall.  Monitor.

## 2021-03-30 NOTE — Progress Notes (Signed)
Subjective:   By signing my name below, I, Shehryar Baig, attest that this documentation has been prepared under the direction and in the presence of Debbrah Alar NP. 03/30/2021    Patient ID: Jane Montes, female    DOB: Feb 22, 1971, 50 y.o.   MRN: [id number]  No chief complaint on file.   HPI Patient is in today for a office visit.  She reports having slight leg swelling.  Colonoscopy- She is due for a colonoscopy and is willing to set up an appointment. Covid-19- She has 2 Covid-19 vaccines and is not willing to get the booster vaccines at this time. Kidney stone- She recently passed a kidney stone and thinks another one is developing.  Anxiety- She has elevated stress in her daily life due to her work. She stopped taking 30 mg Cymbalta daily PO because it was not effective in managing her symptoms. She stopped counseling due to her providers practice shutting down. She continues using 25 atarax 3x daily PRN to help manage her sleep.  Hyperlipidemia- She continues taking 20 mg atorvastatin daily PO and 145 mg fenofibrate daily PO to manage her hyperlipidemia. She is requesting a refill on both medications.    Component Value Date/Time   CHOL 137 01/19/2020 0745   TRIG 229 (H) 05/02/2020 1456   HDL 39.10 01/19/2020 0745   CHOLHDL 4 01/19/2020 0745   VLDL 29.8 01/19/2020 0745   LDLCALC 68 01/19/2020 0745   LDLDIRECT 82.0 12/03/2019 0845     Health Maintenance Due  Topic Date Due   COLONOSCOPY (Pts 45-63yr Insurance coverage will need to be confirmed)  Never done     Past Medical History:  Diagnosis Date   Adenoma of right adrenal gland 10/12/2018   Noted on MRI Abd, measuring 2.2 x 2.4 x 2.7 cm   Anxiety    Aortic atherosclerosis (HTowner 10/10/2018   Noted on [place]  Fatty liver 10/12/2018   Noted on MRI Abd   Fatty liver 09/29/2020   GERD (gastroesophageal reflux disease) 09/16/2015   Mod. noted on KUB   Heart palpitations    High cholesterol    History of chest  pain    History of dizziness    History of hiatal hernia 09/16/2015   Small, noted on KUB   History of kidney stones    History of migraine    Hydronephrosis of right kidney 10/12/2018   Mild, noted on MRI Abd   Hyperlipidemia    Left anterior fascicular block (LAFB) 01/10/2013   noted on EKG   Left kidney mass 10/12/2018   Hyperechoic mass mid left kidney measuring 1.0 x 1.0 x 1.2 cm, noted on [place]   Nephrolithiasis 10/12/2018   Right, Noted on MRI Abd   Pelvic pain    Chronic, right lower quadrant   Wears contact lenses    Wears glasses     Past Surgical History:  Procedure Laterality Date   ABDOMINAL HYSTERECTOMY     partial   CHOLECYSTECTOMY N/A 11/18/2020   Procedure: LAPAROSCOPIC CHOLECYSTECTOMY;  Surgeon: BCoralie Keens MD;  [facility]  Service: General;  Laterality: N/A;   ENDOMETRIAL ABLATION     Pelvic   HOLMIUM LASER APPLICATION Right 25/10/8839  Procedure: HOLMIUM LASER APPLICATION;  Surgeon: EFestus Aloe MD;  [facility]  Service: Urology;  Laterality: Right;   URETEROSCOPY WITH HOLMIUM LASER LITHOTRIPSY Right 11/15/2018   Procedure: CYSTOSCOPY, RETROGRADE, URETEROSCOPY WITH HOLMIUM LASER LITHOTRIPSY/STENT PLACEMENT;  Surgeon:  Festus Aloe, MD;  Location: [facility];  Service: Urology;  Laterality: Right;   VAGINAL HYSTERECTOMY  2007   Partial   WISDOM TOOTH EXTRACTION      Family History  Problem Relation Age of Onset   Diabetes Mother        Vickki Muff (recent diagnosis) causes thrombocytosis   Heart attack Mother 100   Heart disease Mother    Hypertension Father    COPD Father    Bipolar disorder Father    Parkinson's disease Father    Cervical cancer Sister    Alcohol abuse Brother    COPD Brother    Hyperlipidemia Brother    Hypertension Brother    Stroke Brother    Heart attack Brother    COPD Maternal Grandmother    Hyperlipidemia Maternal Grandmother     Heart attack Maternal Grandmother    Depression Paternal Grandmother    Heart disease Paternal Grandmother    Heart attack Paternal Grandmother     Social History   Socioeconomic History   Marital status: Legally Separated    Spouse name: Not on file   Number of children: Not on file   Years of education: Not on file   Highest education level: Not on file  Occupational History   Occupation: Nurse    Employer: [employer]  Tobacco Use   Smoking status: Never   Smokeless tobacco: Never  Vaping Use   Vaping Use: Never used  Substance and Sexual Activity   Alcohol use: Yes    Comment: occ   Drug use: No   Sexual activity: Not Currently    Birth control/protection: Surgical  Other Topics Concern   Not on file  Social History Narrative   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     Social Determinants of Health   Financial Resource Strain: Not on file  Food Insecurity: Not on file  Transportation Needs: Not on file  Physical Activity: Not on file  Stress: Not on file  Social Connections: Not on file  Intimate Partner Violence: Not on file    Outpatient Medications Prior to Visit  Medication Sig Dispense Refill   hydrOXYzine (ATARAX/VISTARIL) 25 MG tablet TAKE 1 TABLET (25 MG TOTAL) BY MOUTH 3 (THREE) TIMES DAILY AS NEEDED. 30 tablet 3   indapamide (LOZOL) 1.25 MG tablet Take 1.25 mg by mouth daily.      Multiple Vitamins-Minerals (MULTIVITAMIN PO) Take 1 tablet by mouth daily.      Omega-3 Fatty Acids (FISH OIL) 1000 MG CAPS Take 2 capsules (2,000 mg total) by mouth 2 (two) times daily.  0   atorvastatin (LIPITOR) 20 MG tablet TAKE 1 TABLET (20 MG TOTAL) BY MOUTH DAILY. 30 tablet 3   fenofibrate (TRICOR) 145 MG tablet Take 1 tablet (145 mg total) by mouth daily. Please call to schedule appointment for future refills. 30 tablet 0   indapamide (LOZOL) 1.25 MG tablet TAKE 1 TABLET BY MOUTH  IN THE MORNING 30 tablet 3   indapamide (LOZOL) 1.25 MG tablet TAKE 1 TABLET BY MOUTH DAILY IN THE MORNING 30 tablet 3   oxyCODONE (OXY IR/ROXICODONE) 5 MG immediate release tablet TAKE 1 TABLET (5 MG TOTAL) BY MOUTH EVERY 6 (SIX) HOURS AS NEEDED FOR MODERATE PAIN OR SEVERE PAIN. 25 tablet 0   No facility-administered medications prior to visit.    Allergies  Allergen Reactions   Cephalexin  Nausea Only and Rash    Review of Systems  Cardiovascular:  Positive for leg swelling.      Objective:    Physical Exam Constitutional:      General: She is not in acute distress.    Appearance: Normal appearance. She is not ill-appearing.  HENT:     Head: Normocephalic and atraumatic.     Right Ear: External ear normal.     Left Ear: External ear normal.  Eyes:     Extraocular Movements: Extraocular movements intact.     Pupils: Pupils are equal, round, and reactive to light.  Cardiovascular:     Rate and Rhythm: Normal rate and regular rhythm.     Pulses: Normal pulses.     Heart sounds: Normal heart sounds. No murmur heard.   No gallop.  Pulmonary:     Effort: Pulmonary effort is normal. No respiratory distress.     Breath sounds: Normal breath sounds. No wheezing, rhonchi or rales.  Skin:    General: Skin is warm and dry.  Neurological:     Mental Status: She is alert and oriented to person, place, and time.  Psychiatric:        Behavior: Behavior normal.    BP 127/83   Pulse 82   Ht $R'5\' 2"'Zp$  (1.575 m)   Wt 213 lb 6.4 oz (96.8 kg)   BMI 39.03 kg/m  Wt Readings from Last 3 Encounters:  03/30/21 213 lb 6.4 oz (96.8 kg)  11/18/20 216 lb 4.3 oz (98.1 kg)  09/29/20 215 lb (97.5 kg)    Diabetic Foot Exam - Simple   No data filed    Lab Results  Component Value Date   WBC 8.5 09/21/2020   HGB 13.1 09/21/2020   HCT 37.8 09/21/2020   PLT 391.0 09/21/2020   GLUCOSE 111 (H) 11/10/2020   CHOL 137 01/19/2020   TRIG 229 (H) 05/02/2020   HDL 39.10 01/19/2020   LDLDIRECT  82.0 12/03/2019   LDLCALC 68 01/19/2020   ALT 34 09/29/2020   AST 28 09/29/2020   NA 142 11/10/2020   K 3.7 11/10/2020   CL 105 11/10/2020   CREATININE 1.37 (H) 11/10/2020   BUN 24 (H) 11/10/2020   CO2 25 11/10/2020   TSH 1.61 12/03/2019   INR 0.92 09/27/2009    Lab Results  Component Value Date   TSH 1.61 12/03/2019   Lab Results  Component Value Date   WBC 8.5 09/21/2020   HGB 13.1 09/21/2020   HCT 37.8 09/21/2020   MCV 87.2 09/21/2020   PLT 391.0 09/21/2020   Lab Results  Component Value Date   NA 142 11/10/2020   K 3.7 11/10/2020   CO2 25 11/10/2020   GLUCOSE 111 (H) 11/10/2020   BUN 24 (H) 11/10/2020   CREATININE 1.37 (H) 11/10/2020   BILITOT 0.6 09/29/2020   ALKPHOS 54 09/29/2020   AST 28 09/29/2020   ALT 34 09/29/2020   PROT 6.9 09/29/2020   ALBUMIN 4.3 09/29/2020   CALCIUM 9.5 11/10/2020   ANIONGAP 12 11/10/2020   GFR 50.65 (L) 09/29/2020   Lab Results  Component Value Date   CHOL 137 01/19/2020   Lab Results  Component Value Date   HDL 39.10 01/19/2020   Lab Results  Component Value Date   LDLCALC 68 01/19/2020   Lab Results  Component Value Date   TRIG 229 (H) 05/02/2020   Lab Results  Component Value Date   CHOLHDL 4 01/19/2020   No results found  for: HGBA1C     Assessment & Plan:   Problem List Items Addressed This Visit       Unprioritized   Nephrolithiasis    She is maintained on indapamide for kidney stone prevention.        Hypertriglyceridemia - Primary    Check lipid panel, continue fenofibrate, atorvastatin 5m.        Relevant Medications   atorvastatin (LIPITOR) 20 MG tablet   fenofibrate (TRICOR) 145 MG tablet   Other Relevant Orders   Lipid panel   Comp Met (CMET)   Anxiety    Didn't like the way she felt on cymbalta.  She is managing well overall.  Monitor.        Other Visit Diagnoses     Preventative health care       Relevant Orders   Ambulatory referral to Gastroenterology        Meds  ordered this encounter  Medications   atorvastatin (LIPITOR) 20 MG tablet    Sig: Take 1 tablet (20 mg total) by mouth daily.    Dispense:  90 tablet    Refill:  1    Order Specific Question:   Supervising Provider    Answer:   BPenni HomansA [[id number]]   fenofibrate (TRICOR) 145 MG tablet    Sig: Take 1 tablet (145 mg total) by mouth daily. Please call to schedule appointment for future refills.    Dispense:  90 tablet    Refill:  1    Order Specific Question:   Supervising Provider    Answer:   BPenni HomansA [[id number]]    I, MDebbrah AlarNP, personally preformed the services described in this documentation.  All medical record entries made by the scribe were at my direction and in my presence.  I have reviewed the chart and discharge instructions (if applicable) and agree that the record reflects my personal performance and is accurate and complete. 03/30/2021   I,Shehryar Baig,acting as a sEducation administratorfor MNance Pear NP.,have documented all relevant documentation on the behalf of MNance Pear NP,as directed by  MNance Pear NP while in the presence of MNance Pear NP.   MNance Pear NP

## 2021-03-30 NOTE — Assessment & Plan Note (Signed)
She is maintained on indapamide for kidney stone prevention.

## 2021-04-04 ENCOUNTER — Other Ambulatory Visit (HOSPITAL_COMMUNITY): Payer: Self-pay

## 2021-05-12 MED FILL — Hydroxyzine HCl Tab 25 MG: ORAL | 10 days supply | Qty: 30 | Fill #2 | Status: AC

## 2021-05-13 ENCOUNTER — Other Ambulatory Visit: Payer: Self-pay

## 2021-05-13 ENCOUNTER — Other Ambulatory Visit (HOSPITAL_COMMUNITY): Payer: Self-pay

## 2021-05-13 MED ORDER — INDAPAMIDE 1.25 MG PO TABS
1.2500 mg | ORAL_TABLET | Freq: Every morning | ORAL | 3 refills | Status: DC
  Filled 2021-05-13: qty 30, 30d supply, fill #0
  Filled 2021-06-09: qty 30, 30d supply, fill #1
  Filled 2021-07-12: qty 30, 30d supply, fill #2
  Filled 2021-08-16: qty 30, 30d supply, fill #3

## 2021-05-17 ENCOUNTER — Other Ambulatory Visit (HOSPITAL_COMMUNITY): Payer: Self-pay

## 2021-05-17 ENCOUNTER — Ambulatory Visit (INDEPENDENT_AMBULATORY_CARE_PROVIDER_SITE_OTHER): Payer: 59 | Admitting: Podiatry

## 2021-05-17 ENCOUNTER — Other Ambulatory Visit: Payer: Self-pay

## 2021-05-17 DIAGNOSIS — L600 Ingrowing nail: Secondary | ICD-10-CM | POA: Diagnosis not present

## 2021-05-17 DIAGNOSIS — L03031 Cellulitis of right toe: Secondary | ICD-10-CM | POA: Diagnosis not present

## 2021-05-17 MED ORDER — NEOMYCIN-POLYMYXIN-HC 3.5-10000-1 OT SUSP
OTIC | 0 refills | Status: DC
  Filled 2021-05-17: qty 10, 31d supply, fill #0

## 2021-05-17 MED ORDER — DOXYCYCLINE HYCLATE 100 MG PO TABS
100.0000 mg | ORAL_TABLET | Freq: Two times a day (BID) | ORAL | 0 refills | Status: DC
  Filled 2021-05-17: qty 10, 5d supply, fill #0

## 2021-05-17 NOTE — Patient Instructions (Signed)

## 2021-05-18 NOTE — Progress Notes (Signed)
  Subjective:  Patient ID: Jane Montes, female    DOB: 01-03-1971,  MRN: [id number]  Chief Complaint  Patient presents with   Ingrown Toenail      (np) right foot great toe ingrown    50 y.o. female presents with the above complaint. History confirmed with patient.  She previously damaged this toenail and has not normal and is now ingrown after a pedicure  Objective:  Physical Exam: warm, good capillary refill, no trophic changes or ulcerative lesions, normal DP and PT pulses, and normal sensory exam.  Right Foot: Ingrown hallux lateral border with paronychia  Assessment:   1. Ingrowing right great toenail   2. Paronychia of toe of right foot      Plan:  Patient was evaluated and treated and all questions answered.    Ingrown Nail, right -Patient elects to proceed with minor surgery to remove ingrown toenail today. Consent reviewed and signed by patient. -Ingrown nail excised. See procedure note. -Educated on post-procedure care including soaking. Written instructions provided and reviewed. -Doxycycline sent to pharmacy for paronychia (Keflex allergy) -Patient to follow up in 2 weeks for nail check.  Procedure: Excision of Ingrown Toenail Location: Right 1st toe lateral nail borders. Anesthesia: Lidocaine 1% plain; 1.5 mL and Marcaine 0.5% plain; 1.5 mL, digital block. Skin Prep: Betadine. Dressing: Silvadene; telfa; dry, sterile, compression dressing. Technique: Following skin prep, the toe was exsanguinated and a tourniquet was secured at the base of the toe. The affected nail border was freed, split with a nail splitter, and excised. Chemical matrixectomy was then performed with phenol and irrigated out with alcohol. The tourniquet was then removed and sterile dressing applied. Disposition: Patient tolerated procedure well. Patient to return in 2 weeks for follow-up.    Return in about 2 weeks (around 05/31/2021) for nail re-check.

## 2021-05-30 ENCOUNTER — Encounter: Payer: Self-pay | Admitting: Family

## 2021-06-02 ENCOUNTER — Ambulatory Visit (INDEPENDENT_AMBULATORY_CARE_PROVIDER_SITE_OTHER): Payer: 59 | Admitting: Podiatry

## 2021-06-02 ENCOUNTER — Other Ambulatory Visit: Payer: Self-pay

## 2021-06-02 DIAGNOSIS — L600 Ingrowing nail: Secondary | ICD-10-CM | POA: Diagnosis not present

## 2021-06-06 ENCOUNTER — Encounter: Payer: Self-pay | Admitting: Podiatry

## 2021-06-06 NOTE — Progress Notes (Signed)
  Subjective:  Patient ID: Jane Montes, female    DOB: 1971/06/26,  MRN: [id number]  Chief Complaint  Patient presents with   Ingrown Toenail    2 week nail check right.    50 y.o. female returns for follow-up with the above complaint. History confirmed with patient.  Doing well the drainage has reduced and is not very painful now  Objective:  Physical Exam: warm, good capillary refill, no trophic changes or ulcerative lesions, normal DP and PT pulses, and normal sensory exam.  Right Foot: Matricectomy site is healing well  Assessment:   1. Ingrowing right great toenail      Plan:  Patient was evaluated and treated and all questions answered.    Ingrown Nail, right -Overall doing well she can leave open to air at this point and discontinue soaks and ointment.  Return to see me as needed for this.    No follow-ups on file.

## 2021-06-09 ENCOUNTER — Other Ambulatory Visit (HOSPITAL_COMMUNITY): Payer: Self-pay

## 2021-06-23 ENCOUNTER — Ambulatory Visit (INDEPENDENT_AMBULATORY_CARE_PROVIDER_SITE_OTHER): Payer: 59 | Admitting: Podiatry

## 2021-06-23 ENCOUNTER — Other Ambulatory Visit (HOSPITAL_COMMUNITY): Payer: Self-pay

## 2021-06-23 ENCOUNTER — Encounter: Payer: Self-pay | Admitting: Podiatry

## 2021-06-23 ENCOUNTER — Other Ambulatory Visit: Payer: Self-pay

## 2021-06-23 DIAGNOSIS — L600 Ingrowing nail: Secondary | ICD-10-CM

## 2021-06-23 MED ORDER — DOXYCYCLINE HYCLATE 100 MG PO TABS
100.0000 mg | ORAL_TABLET | Freq: Two times a day (BID) | ORAL | 0 refills | Status: DC
Start: 2021-06-23 — End: 2021-09-13
  Filled 2021-06-23: qty 20, 10d supply, fill #0

## 2021-06-24 NOTE — Progress Notes (Signed)
Subjective:   Patient ID: Jane Montes, female   DOB: 50 y.o.   MRN: [id number]   HPI Patient presents with some tenderness of the right hallux nail with history of having ingrown fix and just concerned about some localized redness   ROS      Objective:  Physical Exam  Neurovascular status intact with irritation of the right hallux lateral side localized no proximal edema or edema drainage noted     Assessment:  Possibility for low-grade ingrown toenail paronychia right hallux with mild nail looseness but it is still adhered     Plan:  H&P discussed possible loss of total nail and I will to start her on antibiotic doxycycline just to be safe and soaks and patient will be seen back as needed if symptoms were to get worse or not improve

## 2021-07-12 ENCOUNTER — Other Ambulatory Visit (HOSPITAL_COMMUNITY): Payer: Self-pay

## 2021-08-17 ENCOUNTER — Other Ambulatory Visit (HOSPITAL_COMMUNITY): Payer: Self-pay

## 2021-08-22 ENCOUNTER — Encounter: Payer: Self-pay | Admitting: Cardiovascular Disease

## 2021-08-22 NOTE — Progress Notes (Signed)
Jane Montes Date of Birth  01-08-1971       [facility]    [employer] [address], [address] [place], [place], [place]  [address]   [place],   [phone]     [phone]   Fax  [phone]    Fax [phone]  Problem List: 1. Palpitations 2. Hyperlipidemia 3. Dizziness    Previous notes.   Jane Montes is  a 50 year old female who presents today for followup of her palpitations and episodes of presyncope. She also is noted that her blood pressures been elevated. Is been under lots of stress recently. She's going through a separation. She's been exercising quite a bit and has been lifting a lot of weights. She is on a Paleo diet and has lost 28 pounds.  She's noticed lots of episodes of palpitations and lightheadedness. These primarily occur after she's been weight lifting. She lifts 280 pounds in a dead lift and this typically causes her to be dizzy and see "white spots".  She also has had some palpitations. These occurred at times are not necessarily related he any specific activity.  January 09, 2013:  She has continued to have dizziness and " white spots" in her vision.  She has been seen in the ER.   When she was on her way into the ER she had an  episode of CP / pressure.  Radiation to her right arm.  The pain was intermitant for hours.  Troponins were negtive, head CT was normal.  She has these episodes at work - she has hooked herself up to the monitor and has normal rhythm.  Her BP has also been normal during these episodes.    She broke her foot in January, 2014.  She had previously been doing lots of cardio exercise but has had to back off schedule.  She has continued to do her Cross-Fit workouts without chest pain.   We did a stress test years ago  Sept. 16, 2019:  Jane Montes is seen back after a 5 year absence Has had some stress - going through a separatin and divorce.  Has heart burn. Has a small hiatal hernia    Has been stress eating ,  Has gained 25 lbs.    Lifts weights, spin classes,  Kick boxing  3-5 days a week   No CP with exercise.  Has had some vague CP ( occurred after an argument )  Pain was mid sternal ,   Slightly to the left  Lasted 30-40 min No CP or pressured with her work outs .   Mother had her first MI at age 29.     Feb 13, 2020:  Jane Montes is seen today for follow up of her HTN and hyperlipidemia  She has had kidney stones since her last visit Her triglyceride level has been as high as 737.  Her last triglyceride level on January 19, 2020 is 149. On lipitor, tricor, omega 3 fatty acids  We did a coronary CT angiogram in October, 2019.  She has a coronary calcium score of 0.  She had no plaque in any of her coronary arteries.   Her brother Mickie Hillier died of a MI at 31 Wt is 197 lbs.  No angina  Has had some anxiety issues - started on cymbalt  Is exercising .   Spin class, walking , zoomba  Nov. 15, 2022: Jane Montes is seen today for follow up visit of  her chest pain and HLD  Wt is 214 lbs. ( Up 17 lbs from last year )   Has had some chest pain , radiates to jaw May last for 5-10 min As long as 30 min Not associated with exertion  Has had more leg swelling ,   Takes OTC diuretics  Rides stationary bike twice a week. Avoids salt .   Bp has been borderline elevated  Coronary CT angiogram in October, 2019 revealed a coronary calcium score of 0 and no coronary atherosclerosis.  She has occasional episodes of "feeling off "  in the middle of the day .  She is unsure if it is a BP issue or blood glucose issue  BP has tended to run higher over the past several months.   Likely due to her weight gain      Current Outpatient Medications on File Prior to Visit  Medication Sig Dispense Refill   atorvastatin (LIPITOR) 20 MG tablet Take 1 tablet (20 mg total) by mouth daily. 90 tablet 1   fenofibrate (TRICOR) 145 MG tablet Take 1 tablet (145 mg total) by mouth daily. Please  call to schedule appointment for future refills. 90 tablet 1   hydrOXYzine (ATARAX/VISTARIL) 25 MG tablet TAKE 1 TABLET (25 MG TOTAL) BY MOUTH 3 (THREE) TIMES DAILY AS NEEDED. 30 tablet 3   indapamide (LOZOL) 1.25 MG tablet Take 1 tablet (1.25 mg total) by mouth every morning. 30 tablet 3   doxycycline (VIBRA-TABS) 100 MG tablet Take 1 tablet (100 mg total) by mouth 2 (two) times daily. (Patient not taking: Reported on 08/23/2021) 20 tablet 0   indapamide (LOZOL) 1.25 MG tablet Take 1.25 mg by mouth daily.  (Patient not taking: Reported on 08/23/2021)     lisinopril (ZESTRIL) 10 MG tablet Take 1 tablet by mouth daily. (Patient not taking: Reported on 08/23/2021)     Multiple Vitamins-Minerals (MULTIVITAMIN PO) Take 1 tablet by mouth daily.  (Patient not taking: Reported on 08/23/2021)     neomycin-polymyxin-hydrocortisone (CORTISPORIN) 3.5-10000-1 OTIC suspension Apply 1-2 drops daily after soaking and cover with bandaid (Patient not taking: Reported on 08/23/2021) 10 mL 0   Omega-3 Fatty Acids (FISH OIL) 1000 MG CAPS Take 2 capsules (2,000 mg total) by mouth 2 (two) times daily. (Patient not taking: Reported on 08/23/2021)  0   [DISCONTINUED] DULoxetine (CYMBALTA) 30 MG capsule 1 cap PO once daily for 2 weeks, then 1 cap PO QOD x 1 week then stop 18 capsule 0   No current facility-administered medications on file prior to visit.    Allergies  Allergen Reactions   Cephalexin Nausea Only and Rash    Past Medical History:  Diagnosis Date   Adenoma of right adrenal gland 10/12/2018   Noted on MRI Abd, measuring 2.2 x 2.4 x 2.7 cm   Anxiety    Aortic atherosclerosis ([place]) 10/10/2018   Noted on [place]   Fatty liver 10/12/2018   Noted on MRI Abd   Fatty liver 09/29/2020   GERD (gastroesophageal reflux disease) 09/16/2015   Mod. noted on KUB   Heart palpitations    High cholesterol    History of chest pain    History of dizziness    History of hiatal hernia 09/16/2015   Small, noted on KUB    History of kidney stones    History of migraine    Hydronephrosis of right kidney 10/12/2018   Mild, noted on MRI Abd   Hyperlipidemia    Left anterior fascicular block (  LAFB) 01/10/2013   noted on EKG   Left kidney mass 10/12/2018   Hyperechoic mass mid left kidney measuring 1.0 x 1.0 x 1.2 cm, noted on [place] AB   Nephrolithiasis 10/12/2018   Right, Noted on MRI Abd   Pelvic pain    Chronic, right lower quadrant   Wears contact lenses    Wears glasses     Past Surgical History:  Procedure Laterality Date   ABDOMINAL HYSTERECTOMY     partial   CHOLECYSTECTOMY N/A 11/18/2020   Procedure: LAPAROSCOPIC CHOLECYSTECTOMY;  Surgeon: Coralie Keens, MD;  Location: [facility];  Service: General;  Laterality: N/A;   ENDOMETRIAL ABLATION     Pelvic   HOLMIUM LASER APPLICATION Right 10/15/6158   Procedure: HOLMIUM LASER APPLICATION;  Surgeon: Festus Aloe, MD;  Location: [facility];  Service: Urology;  Laterality: Right;   URETEROSCOPY WITH HOLMIUM LASER LITHOTRIPSY Right 11/15/2018   Procedure: CYSTOSCOPY, RETROGRADE, URETEROSCOPY WITH HOLMIUM LASER LITHOTRIPSY/STENT PLACEMENT;  Surgeon: Festus Aloe, MD;  Location: [facility];  Service: Urology;  Laterality: Right;   VAGINAL HYSTERECTOMY  2007   Partial   WISDOM TOOTH EXTRACTION      Social History   Tobacco Use  Smoking Status Never  Smokeless Tobacco Never    Social History   Substance and Sexual Activity  Alcohol Use Yes   Comment: occ    Family History  Problem Relation Age of Onset   Diabetes Mother        Vickki Muff (recent diagnosis) causes thrombocytosis   Heart attack Mother 67   Heart disease Mother    Hypertension Father    COPD Father    Bipolar disorder Father    Parkinson's disease Father    Cervical cancer Sister    Alcohol abuse Brother    COPD Brother    Hyperlipidemia Brother    Hypertension Brother    Stroke Brother    Heart attack  Brother    COPD Maternal Grandmother    Hyperlipidemia Maternal Grandmother    Heart attack Maternal Grandmother    Depression Paternal Grandmother    Heart disease Paternal Grandmother    Heart attack Paternal Grandmother     Reviw of Systems:  Reviewed in the HPI.  All other systems are negative.  Physical Exam: Blood pressure (!) 140/96, pulse 97, height 5\' 2"  (1.575 m), weight 214 lb 6.4 oz (97.3 kg), SpO2 98 %.  GEN:  moderately obese female,  pleasant, NAD  HEENT: Normal NECK: No JVD; No carotid bruits LYMPHATICS: No lymphadenopathy CARDIAC: RRR , no murmurs, rubs, gallops RESPIRATORY:  Clear to auscultation without rales, wheezing or rhonchi  ABDOMEN: Soft, non-tender, non-distended MUSCULOSKELETAL:  No edema; No deformity  SKIN: Warm and dry NEUROLOGIC:  Alert and oriented x 3   ECG: August 23, 2021: Normal sinus rhythm at 97.  No ST or T wave changes.  Assessment / Plan:   1.  Chest discomfort:   -She continues to have intermittent episodes of chest discomfort.  These episodes might last anywhere from 5 minutes to 30 minutes.  It is described as a chest tightness.  They are not associated with exertion.  She is able to cycle and do other exercises without any chest pain.  I am concerned about coronary artery disease because she has a very strong family history of coronary disease and has hyperlipidemia, hypertension.  We performed a coronary CT angiogram in 2019 which revealed a coronary calcium score of 0.  Revealed no evidence of coronary artery disease.  I like to send in a prescription for nitroglycerin so that she can use on an intermittent basis.  If she starts having chest pain she will have that to use.  I would have a low threshold to repeat her coronary CT angiogram or even referral for heart catheterization if the episodes of chest pain worsen.  2.  Hypertension: Blood pressure has been creeping up slowly.  This is probably a result of her weight gain.  We  will start her on losartan 50 mg a day.  We will check a basic metabolic profile in 3 weeks.  3.  Obesity:  she will work on weight loss.  She will check with our benefits program to see what is available through our insurance  Consider [facility] weigh loss program .   4.    Hyperlipidemia:   trigs remain slightly elevated.   Cont weight loss efforts.      Mertie Moores, MD  08/23/2021 11:57 AM    [place],  [place], [place]  [address] Pager [phone] Phone: [phone]; Fax: [phone]

## 2021-08-23 ENCOUNTER — Other Ambulatory Visit: Payer: Self-pay

## 2021-08-23 ENCOUNTER — Encounter: Payer: Self-pay | Admitting: Cardiovascular Disease

## 2021-08-23 ENCOUNTER — Other Ambulatory Visit (HOSPITAL_COMMUNITY): Payer: Self-pay

## 2021-08-23 ENCOUNTER — Ambulatory Visit: Payer: 59 | Admitting: Cardiovascular Disease

## 2021-08-23 VITALS — BP 140/96 | HR 97 | Ht 62.0 in | Wt 214.4 lb

## 2021-08-23 DIAGNOSIS — E785 Hyperlipidemia, unspecified: Secondary | ICD-10-CM | POA: Diagnosis not present

## 2021-08-23 DIAGNOSIS — R002 Palpitations: Secondary | ICD-10-CM | POA: Diagnosis not present

## 2021-08-23 DIAGNOSIS — I10 Essential (primary) hypertension: Secondary | ICD-10-CM | POA: Diagnosis not present

## 2021-08-23 DIAGNOSIS — R0789 Other chest pain: Secondary | ICD-10-CM | POA: Diagnosis not present

## 2021-08-23 DIAGNOSIS — E781 Pure hyperglyceridemia: Secondary | ICD-10-CM

## 2021-08-23 MED ORDER — NITROGLYCERIN 0.4 MG SL SUBL
SUBLINGUAL_TABLET | SUBLINGUAL | 3 refills | Status: DC
  Filled 2021-08-23: qty 25, 5d supply, fill #0

## 2021-08-23 MED ORDER — LOSARTAN POTASSIUM 50 MG PO TABS
50.0000 mg | ORAL_TABLET | Freq: Every day | ORAL | 3 refills | Status: DC
Start: 2021-08-23 — End: 2021-11-25
  Filled 2021-08-23: qty 30, 30d supply, fill #0
  Filled 2021-09-21: qty 30, 30d supply, fill #1
  Filled 2021-10-13: qty 30, 30d supply, fill #2
  Filled 2021-11-17: qty 30, 30d supply, fill #3

## 2021-08-23 NOTE — Patient Instructions (Addendum)
Medication Instructions:  Your physician has recommended you make the following change in your medication:  1-START Losartan 50 mg by mouth daily. 2-Take 1 Nitroglycerin (NTG) under your tongue, while sitting.  If no relief of pain may repeat NTG, one tab every 5 minutes up to 3 tablets total over 15 minutes.  If no relief CALL 911.  If you have dizziness/lightheadness  while taking NTG, stop taking and call 911.   3-STOP lisinopril   *If you need a refill on your cardiac medications before your next appointment, please call your pharmacy*  Lab Work: Your physician recommends that you return for lab work in: 3 weeks for BMET  If you have labs (blood work) drawn today and your tests are completely normal, you will receive your results only by: [place] (if you have MyChart) OR A paper copy in the mail If you have any lab test that is abnormal or we need to change your treatment, we will call you to review the results.  Testing/Procedures: None ordered today.  Follow-Up: At [facility], you and your health needs are our priority.  As part of our continuing mission to provide you with exceptional heart care, we have created designated Provider Care Teams.  These Care Teams include your primary Cardiologist (physician) and Advanced Practice Providers (APPs -  Physician Assistants and Nurse Practitioners) who all work together to provide you with the care you need, when you need it.  We recommend signing up for the patient portal called "MyChart".  Sign up information is provided on this After Visit Summary.  MyChart is used to connect with patients for Virtual Visits (Telemedicine).  Patients are able to view lab/test results, encounter notes, upcoming appointments, etc.  Non-urgent messages can be sent to your provider as well.   To learn more about what you can do with MyChart, go to [email or web address].    Your next appointment:   3 month(s)  The format for your next  appointment:   In Person  Provider:   Mertie Moores, MD

## 2021-09-08 ENCOUNTER — Telehealth: Payer: Self-pay | Admitting: Pharmacist

## 2021-09-08 DIAGNOSIS — E781 Pure hyperglyceridemia: Secondary | ICD-10-CM

## 2021-09-08 NOTE — Telephone Encounter (Signed)
-----   Message from Thayer Headings, MD sent at 09/08/2021  3:04 PM EST ----- Hi [name],  Last week you helped me start a patient on a weight loss injection. Jane Montes is a patient who I follow who is also interested in getting on a weight loss drug. I'd like to refer her to your medication management clinic and be evaluated for one of these medications. I just saw her in the office last week.  Please let me know how to help facilitate this .  Thanks  [employer]

## 2021-09-08 NOTE — Telephone Encounter (Addendum)
Prior authorization for Saxenda submitted and denied, weight loss meds are excluded from her benefits so Mancel Parsons will not be covered either. Tried sending in [place] rx to see if med is covered without prior authorization. Called pharmacy who states that ICD 10 code is needed before it will tell them if PA is required or not. Obesity ICD 10 code would not work, will see if pt is able to come in to check A1c which would hopefully help with Ozempic coverage depending on her result.  Spoke with pt, she is already coming in for BMET on 12/5. Will add on A1c.

## 2021-09-09 ENCOUNTER — Other Ambulatory Visit (HOSPITAL_COMMUNITY): Payer: Self-pay

## 2021-09-09 MED ORDER — OZEMPIC (0.25 OR 0.5 MG/DOSE) 2 MG/1.5ML ~~LOC~~ SOPN
PEN_INJECTOR | SUBCUTANEOUS | 1 refills | Status: DC
  Filled 2021-09-09: qty 1.5, 30d supply, fill #0

## 2021-09-12 ENCOUNTER — Other Ambulatory Visit: Payer: Self-pay

## 2021-09-12 ENCOUNTER — Other Ambulatory Visit: Payer: 59 | Admitting: *Deleted

## 2021-09-12 DIAGNOSIS — R002 Palpitations: Secondary | ICD-10-CM

## 2021-09-12 DIAGNOSIS — E785 Hyperlipidemia, unspecified: Secondary | ICD-10-CM

## 2021-09-12 DIAGNOSIS — E781 Pure hyperglyceridemia: Secondary | ICD-10-CM

## 2021-09-12 DIAGNOSIS — R0789 Other chest pain: Secondary | ICD-10-CM

## 2021-09-13 ENCOUNTER — Other Ambulatory Visit: Payer: Self-pay | Admitting: Pharmacist

## 2021-09-13 ENCOUNTER — Encounter: Payer: Self-pay | Admitting: Pharmacist

## 2021-09-13 ENCOUNTER — Other Ambulatory Visit (HOSPITAL_COMMUNITY): Payer: Self-pay

## 2021-09-13 LAB — BASIC METABOLIC PANEL
BUN/Creatinine Ratio: 19 (ref 9–23)
BUN: 23 mg/dL (ref 6–24)
CO2: 23 mmol/L (ref 20–29)
Calcium: 10.2 mg/dL (ref 8.7–10.2)
Chloride: 101 mmol/L (ref 96–106)
Creatinine, Ser: 1.22 mg/dL — ABNORMAL HIGH (ref 0.57–1.00)
Glucose: 139 mg/dL — ABNORMAL HIGH (ref 70–99)
Potassium: 4.7 mmol/L (ref 3.5–5.2)
Sodium: 139 mmol/L (ref 134–144)
eGFR: 54 mL/min/{1.73_m2} — ABNORMAL LOW (ref >59)

## 2021-09-13 LAB — HEMOGLOBIN A1C
Est. average glucose Bld gHb Est-mCnc: 146 mg/dL
Hgb A1c MFr Bld: 6.7 % — ABNORMAL HIGH (ref 4.8–5.6)

## 2021-09-13 MED ORDER — OZEMPIC (0.25 OR 0.5 MG/DOSE) 2 MG/1.5ML ~~LOC~~ SOPN
PEN_INJECTOR | SUBCUTANEOUS | 1 refills | Status: DC
  Filled 2021-09-13: qty 1.5, 28d supply, fill #0
  Filled 2021-10-13 – 2021-10-14 (×2): qty 1.5, 28d supply, fill #1

## 2021-09-13 MED ORDER — INDAPAMIDE 1.25 MG PO TABS
1.2500 mg | ORAL_TABLET | Freq: Every morning | ORAL | 3 refills | Status: DC
  Filled 2021-09-13: qty 30, 30d supply, fill #0
  Filled 2021-10-13: qty 30, 30d supply, fill #1
  Filled 2021-11-17: qty 30, 30d supply, fill #2
  Filled 2021-12-14: qty 30, 30d supply, fill #3

## 2021-09-13 NOTE — Progress Notes (Signed)
Ozempic prior authorization approved, rx sent to pharmacy to determine copay.

## 2021-09-13 NOTE — Telephone Encounter (Signed)
Ozempic prior authorization approved. Rx sent to pharmacy. Called pt to discuss new diagnosis of DM and that starting Ozempic will help lower her A1c and with weight loss. Will follow up by phone for monthly dose titrations.

## 2021-09-13 NOTE — Addendum Note (Signed)
Addended by: Jehu Mccauslin E on: 09/13/2021 08:59 AM   Modules accepted: Orders

## 2021-09-20 ENCOUNTER — Ambulatory Visit (INDEPENDENT_AMBULATORY_CARE_PROVIDER_SITE_OTHER): Payer: 59 | Admitting: Podiatry

## 2021-09-20 ENCOUNTER — Other Ambulatory Visit: Payer: Self-pay

## 2021-09-20 DIAGNOSIS — S91209A Unspecified open wound of unspecified toe(s) with damage to nail, initial encounter: Secondary | ICD-10-CM

## 2021-09-20 NOTE — Progress Notes (Signed)
°  Subjective:  Patient ID: Jane Montes, female    DOB: July 19, 1971,  MRN: [id number]  Chief Complaint  Patient presents with   Nail Problem    R great toe - trauma to toe , ripped off nail    50 y.o. female presents with the above complaint. History confirmed with patient.  Nail got caught and is lifting up causing quite a pain nearly fully off  Objective:  Physical Exam: warm, good capillary refill, no trophic changes or ulcerative lesions, normal DP and PT pulses, and normal sensory exam.  Right Foot: Right hallux nail loosely attached proximally  Assessment:   1. Traumatic avulsion of nail plate of toe, initial encounter      Plan:  Patient was evaluated and treated and all questions answered.  Recommend avulsion of the nail today following digital block with 1.5 cc each of 2% lidocaine and 0.5% Marcaine plain following sterile prep with Betadine.  She tolerated this well was dressed with bandage and postop care instructions given  Return if symptoms worsen or fail to improve.

## 2021-09-20 NOTE — Patient Instructions (Signed)

## 2021-09-21 ENCOUNTER — Other Ambulatory Visit (HOSPITAL_COMMUNITY): Payer: Self-pay

## 2021-10-06 ENCOUNTER — Other Ambulatory Visit: Payer: Self-pay | Admitting: Family

## 2021-10-06 ENCOUNTER — Other Ambulatory Visit (HOSPITAL_COMMUNITY): Payer: Self-pay

## 2021-10-06 MED ORDER — ATORVASTATIN CALCIUM 20 MG PO TABS
20.0000 mg | ORAL_TABLET | Freq: Every day | ORAL | 1 refills | Status: DC
  Filled 2021-10-06: qty 30, 30d supply, fill #0
  Filled 2021-11-17: qty 30, 30d supply, fill #1
  Filled 2021-12-14: qty 30, 30d supply, fill #2
  Filled 2022-01-11: qty 30, 30d supply, fill #3
  Filled 2022-02-12: qty 30, 30d supply, fill #4
  Filled 2022-03-14: qty 30, 30d supply, fill #5

## 2021-10-06 MED ORDER — FENOFIBRATE 145 MG PO TABS
145.0000 mg | ORAL_TABLET | Freq: Every day | ORAL | 1 refills | Status: DC
  Filled 2021-10-06: qty 30, 30d supply, fill #0
  Filled 2021-11-17: qty 30, 30d supply, fill #1
  Filled 2021-12-14: qty 30, 30d supply, fill #2
  Filled 2022-01-11: qty 30, 30d supply, fill #3
  Filled 2022-02-12: qty 30, 30d supply, fill #4
  Filled 2022-03-14: qty 30, 30d supply, fill #5

## 2021-10-07 LAB — HM DIABETES EYE EXAM

## 2021-10-11 ENCOUNTER — Telehealth: Payer: Self-pay | Admitting: Pharmacist

## 2021-10-11 NOTE — Telephone Encounter (Signed)
Called pt and left message.  Following up with tolerability of Ozempic started 1 month ago. If tolerating well, will increase to 0.5mg  for 2nd month.

## 2021-10-12 NOTE — Telephone Encounter (Signed)
Patient returned call. She is doing well on Ozempic. Increased to 0.5mg  today. She has new insurance. Now is on [employer]. RxBIN [id number], PCN [id number] ID [id number]  I will submit PA for Ozempic.  Patient's plan may cover [phone], but will continue with ozempic for now since the [facility] shortage is not completely resolved. Patient does have DM. Patient aware that if copay is >$25 that she can get a copay card online.   She will call us, if we have not called her yet in 3 weeks. Will plan to increase to 1mg  after 4 weeks of 0.5mg . Patient may use the last 2 shots of her 0.5mg  pen and give them at the same time to make 1mg .  PA for Ozempic submitted to insurance. Will send Rx when PA results.

## 2021-10-13 ENCOUNTER — Other Ambulatory Visit (HOSPITAL_COMMUNITY): Payer: Self-pay

## 2021-10-14 ENCOUNTER — Other Ambulatory Visit (HOSPITAL_COMMUNITY): Payer: Self-pay

## 2021-10-14 ENCOUNTER — Encounter: Payer: Self-pay | Admitting: Pharmacist

## 2021-10-14 NOTE — Telephone Encounter (Signed)
Updated patient that we are still waiting to hear back from her insurance. I called [employer] and they said PA was still under review.

## 2021-10-17 NOTE — Telephone Encounter (Signed)
PA for ozempic denied. Denied since patient has not been on metformin/sulfonylurea. PA for [facility] submitted via telephone. Covermymeds said there was a denial on file, however there was not. Patient made aware of denial and that we submitted PA for [facility].

## 2021-10-24 ENCOUNTER — Other Ambulatory Visit (HOSPITAL_COMMUNITY): Payer: Self-pay

## 2021-10-24 MED ORDER — SEMAGLUTIDE-WEIGHT MANAGEMENT 1.7 MG/0.75ML ~~LOC~~ SOAJ
1.7000 mg | SUBCUTANEOUS | 0 refills | Status: DC
  Filled 2021-10-24: qty 3, fill #0
  Filled 2021-11-24: qty 3, 28d supply, fill #0

## 2021-10-24 MED ORDER — SEMAGLUTIDE-WEIGHT MANAGEMENT 2.4 MG/0.75ML ~~LOC~~ SOAJ
2.4000 mg | SUBCUTANEOUS | 11 refills | Status: DC
  Filled 2021-10-24: qty 3, fill #0
  Filled 2021-12-14: qty 3, 28d supply, fill #0
  Filled 2022-01-11: qty 3, 28d supply, fill #1
  Filled 2022-02-12 – 2022-02-20 (×3): qty 3, 28d supply, fill #2
  Filled 2022-03-14: qty 3, 28d supply, fill #3
  Filled 2022-04-13: qty 3, 28d supply, fill #4
  Filled 2022-05-14: qty 3, 28d supply, fill #5
  Filled 2022-06-08: qty 3, 28d supply, fill #6
  Filled 2022-07-06: qty 3, 28d supply, fill #7
  Filled 2022-08-02: qty 3, 28d supply, fill #8
  Filled 2022-08-28: qty 3, 28d supply, fill #9
  Filled 2022-09-25: qty 3, 28d supply, fill #10

## 2021-10-24 MED ORDER — SEMAGLUTIDE-WEIGHT MANAGEMENT 1 MG/0.5ML ~~LOC~~ SOAJ
1.0000 mg | SUBCUTANEOUS | 0 refills | Status: DC
  Filled 2021-10-24 – 2021-10-25 (×3): qty 2, 28d supply, fill #0

## 2021-10-25 ENCOUNTER — Other Ambulatory Visit (HOSPITAL_COMMUNITY): Payer: Self-pay

## 2021-10-27 ENCOUNTER — Other Ambulatory Visit (HOSPITAL_COMMUNITY): Payer: Self-pay

## 2021-11-01 ENCOUNTER — Other Ambulatory Visit (HOSPITAL_COMMUNITY): Payer: Self-pay

## 2021-11-08 ENCOUNTER — Other Ambulatory Visit (HOSPITAL_COMMUNITY): Payer: Self-pay

## 2021-11-08 MED ORDER — ONDANSETRON HCL 4 MG PO TABS
4.0000 mg | ORAL_TABLET | Freq: Three times a day (TID) | ORAL | 0 refills | Status: DC | PRN
Start: 2021-11-08 — End: 2021-12-30
  Filled 2021-11-08: qty 20, 7d supply, fill #0

## 2021-11-08 NOTE — Addendum Note (Signed)
Addended by: Marcelle Overlie D on: 11/08/2021 11:16 AM   Modules accepted: Orders

## 2021-11-18 ENCOUNTER — Other Ambulatory Visit (HOSPITAL_COMMUNITY): Payer: Self-pay

## 2021-11-24 ENCOUNTER — Encounter: Payer: Self-pay | Admitting: Cardiovascular Disease

## 2021-11-24 ENCOUNTER — Other Ambulatory Visit (HOSPITAL_COMMUNITY): Payer: Self-pay

## 2021-11-24 NOTE — Progress Notes (Signed)
Jane Montes Date of Birth  Aug 08, 1971       [facility]    [employer] [address], [place], [place]  [address]   [place], [place]  [address] [phone]     [phone]   Fax  [phone]    Fax [phone]  Problem List: 1. Palpitations 2. Hyperlipidemia 3. Dizziness    Previous notes.   Jane Montes is  a 51 year old female who presents today for followup of her palpitations and episodes of presyncope. She also is noted that her blood pressures been elevated. Is been under lots of stress recently. She's going through a separation. She's been exercising quite a bit and has been lifting a lot of weights. She is on a Paleo diet and has lost 28 pounds.  She's noticed lots of episodes of palpitations and lightheadedness. These primarily occur after she's been weight lifting. She lifts 280 pounds in a dead lift and this typically causes her to be dizzy and see "white spots".  She also has had some palpitations. These occurred at times are not necessarily related he any specific activity.  January 09, 2013:  She has continued to have dizziness and " white spots" in her vision.  She has been seen in the ER.   When she was on her way into the ER she had an  episode of CP / pressure.  Radiation to her right arm.  The pain was intermitant for hours.  Troponins were negtive, head CT was normal.  She has these episodes at work - she has hooked herself up to the monitor and has normal rhythm.  Her BP has also been normal during these episodes.    She broke her foot in January, 2014.  She had previously been doing lots of cardio exercise but has had to back off schedule.  She has continued to do her Cross-Fit workouts without chest pain.   We did a stress test years ago  Sept. 16, 2019:  Jane Montes is seen back after a 5 year absence Has had some stress - going through a separatin and divorce.  Has heart burn. Has a small hiatal hernia    Has been stress eating ,  Has gained 25 lbs.    Lifts weights, spin classes,  Kick boxing  3-5 days a week   No CP with exercise.  Has had some vague CP ( occurred after an argument )  Pain was mid sternal ,   Slightly to the left  Lasted 30-40 min No CP or pressured with her work outs .   Mother had her first MI at age 17.     Feb 13, 2020:  Jane Montes is seen today for follow up of her HTN and hyperlipidemia  She has had kidney stones since her last visit Her triglyceride level has been as high as 737.  Her last triglyceride level on January 19, 2020 is 149. On lipitor, tricor, omega 3 fatty acids  We did a coronary CT angiogram in October, 2019.  She has a coronary calcium score of 0.  She had no plaque in any of her coronary arteries.   Her brother Mickie Hillier died of a MI at 55 Wt is 197 lbs.  No angina  Has had some anxiety issues - started on cymbalt  Is exercising .   Spin class, walking , zoomba  Nov. 15, 2022: Jane Montes is seen today for follow up visit of  her chest pain and HLD  Wt is 214 lbs. ( Up 17 lbs from last year )   Has had some chest pain , radiates to jaw May last for 5-10 min As long as 30 min Not associated with exertion  Has had more leg swelling ,   Takes OTC diuretics  Rides stationary bike twice a week. Avoids salt .   Bp has been borderline elevated  Coronary CT angiogram in October, 2019 revealed a coronary calcium score of 0 and no coronary atherosclerosis.  She has occasional episodes of "feeling off "  in the middle of the day .  She is unsure if it is a BP issue or blood glucose issue  BP has tended to run higher over the past several months.   Likely due to her weight gain   Feb. 17, 2023 Jane Montes is seen for follow up of some episodes of CP, HLD Coronary CT angio: from Oct. 2019 showed a CAC score of 0 with normal coronaries  She has been on   Semaglutide once a week which has helped her lose quite a bit of weight Wt is 205 lbs - down 9 lbs from  last visit  We      Current Outpatient Medications on File Prior to Visit  Medication Sig Dispense Refill   atorvastatin (LIPITOR) 20 MG tablet Take 1 tablet (20 mg total) by mouth daily. 90 tablet 1   fenofibrate (TRICOR) 145 MG tablet Take 1 tablet (145 mg total) by mouth daily. 90 tablet 1   hydrOXYzine (ATARAX/VISTARIL) 25 MG tablet TAKE 1 TABLET (25 MG TOTAL) BY MOUTH 3 (THREE) TIMES DAILY AS NEEDED. 30 tablet 3   indapamide (LOZOL) 1.25 MG tablet Take 1 tablet (1.25 mg total) by mouth every morning. 30 tablet 3   nitroGLYCERIN (NITROSTAT) 0.4 MG SL tablet Take 1 tablet, under your tongue, while sitting.  If no relief of pain may repeat NTG, one tab every 5 minutes up to 3 tablets total over 15 minutes. 25 tablet 3   ondansetron (ZOFRAN) 4 MG tablet Take 1 tablet (4 mg total) by mouth every 8 (eight) hours as needed for nausea or vomiting. 20 tablet 0   [START ON 01/19/2022] Semaglutide-Weight Management 1.7 MG/0.75ML SOAJ Inject 1.7 mg into the skin once a week. 3 mL 0   [START ON 12/21/2021] Semaglutide-Weight Management 1 MG/0.5ML SOAJ Inject 1 mg into the skin once a week. (Patient not taking: Reported on 11/25/2021) 2 mL 0   [START ON 02/17/2022] Semaglutide-Weight Management 2.4 MG/0.75ML SOAJ Inject 2.4 mg into the skin once a week. (Patient not taking: Reported on 11/25/2021) 3 mL 11   [DISCONTINUED] DULoxetine (CYMBALTA) 30 MG capsule 1 cap PO once daily for 2 weeks, then 1 cap PO QOD x 1 week then stop 18 capsule 0   No current facility-administered medications on file prior to visit.    Allergies  Allergen Reactions   Cephalexin Nausea Only and Rash    Past Medical History:  Diagnosis Date   Adenoma of right adrenal gland 10/12/2018   Noted on MRI Abd, measuring 2.2 x 2.4 x 2.7 cm   Anxiety    Aortic atherosclerosis ([place]) 10/10/2018   Noted on [place]   Fatty liver 10/12/2018   Noted on MRI Abd   Fatty liver 09/29/2020   GERD (gastroesophageal reflux disease) 09/16/2015    Mod. noted on KUB   Heart palpitations    High cholesterol    History of chest pain  History of dizziness    History of hiatal hernia 09/16/2015   Small, noted on KUB   History of kidney stones    History of migraine    Hydronephrosis of right kidney 10/12/2018   Mild, noted on MRI Abd   Hyperlipidemia    Left anterior fascicular block (LAFB) 01/10/2013   noted on EKG   Left kidney mass 10/12/2018   Hyperechoic mass mid left kidney measuring 1.0 x 1.0 x 1.2 cm, noted on [place] AB   Nephrolithiasis 10/12/2018   Right, Noted on MRI Abd   Pelvic pain    Chronic, right lower quadrant   Wears contact lenses    Wears glasses     Past Surgical History:  Procedure Laterality Date   ABDOMINAL HYSTERECTOMY     partial   CHOLECYSTECTOMY N/A 11/18/2020   Procedure: LAPAROSCOPIC CHOLECYSTECTOMY;  Surgeon: Coralie Keens, MD;  Location: [facility] [place] [place];  Service: General;  Laterality: N/A;   ENDOMETRIAL ABLATION     Pelvic   HOLMIUM LASER APPLICATION Right 11/10/1939   Procedure: HOLMIUM LASER APPLICATION;  Surgeon: Festus Aloe, MD;  Location: [facility];  Service: Urology;  Laterality: Right;   URETEROSCOPY WITH HOLMIUM LASER LITHOTRIPSY Right 11/15/2018   Procedure: CYSTOSCOPY, RETROGRADE, URETEROSCOPY WITH HOLMIUM LASER LITHOTRIPSY/STENT PLACEMENT;  Surgeon: Festus Aloe, MD;  Location: [facility];  Service: Urology;  Laterality: Right;   VAGINAL HYSTERECTOMY  2007   Partial   WISDOM TOOTH EXTRACTION      Social History   Tobacco Use  Smoking Status Never  Smokeless Tobacco Never    Social History   Substance and Sexual Activity  Alcohol Use Yes   Comment: occ    Family History  Problem Relation Age of Onset   Diabetes Mother        Vickki Muff (recent diagnosis) causes thrombocytosis   Heart attack Mother 1   Heart disease Mother    Hypertension Father    COPD Father    Bipolar disorder Father    Parkinson's  disease Father    Cervical cancer Sister    Alcohol abuse Brother    COPD Brother    Hyperlipidemia Brother    Hypertension Brother    Stroke Brother    Heart attack Brother    COPD Maternal Grandmother    Hyperlipidemia Maternal Grandmother    Heart attack Maternal Grandmother    Depression Paternal Grandmother    Heart disease Paternal Grandmother    Heart attack Paternal Grandmother     Reviw of Systems:  Reviewed in the HPI.  All other systems are negative.  Physical Exam: Blood pressure 108/74, pulse 87, height 5\' 2"  (1.575 m), weight 205 lb (93 kg), SpO2 96 %.  GEN:  Well nourished, well developed in no acute distress HEENT: Normal NECK: No JVD; No carotid bruits LYMPHATICS: No lymphadenopathy CARDIAC: RRR , no murmurs, rubs, gallops RESPIRATORY:  Clear to auscultation without rales, wheezing or rhonchi  ABDOMEN: Soft, non-tender, non-distended MUSCULOSKELETAL:  No edema; No deformity  SKIN: Warm and dry NEUROLOGIC:  Alert and oriented x 3    ECG:    Assessment / Plan:   1.  Chest discomfort:   -  no recent episodes of CP .  Cont to watch    2.  Hypertension: BP is well controlled.   Actually low today .   As she loses weight, I suspect she will be able to reduce her Losartan dose .   Will go ahead  and reduce her Losartan to 25 mg a day ,.   3.  Obesity:   is steadily losing weight .  Cont current  meds.   4.    Hyperlipidemia:   trigs remain slightly elevated.   Cont weight loss efforts.      Mertie Moores, MD  11/25/2021 11:36 AM    [address],  [place], [place]  [address] Pager [phone] Phone: [phone]; Fax: [phone]

## 2021-11-25 ENCOUNTER — Other Ambulatory Visit: Payer: Self-pay

## 2021-11-25 ENCOUNTER — Ambulatory Visit (INDEPENDENT_AMBULATORY_CARE_PROVIDER_SITE_OTHER): Payer: 59 | Admitting: Cardiovascular Disease

## 2021-11-25 ENCOUNTER — Encounter: Payer: Self-pay | Admitting: Cardiovascular Disease

## 2021-11-25 ENCOUNTER — Other Ambulatory Visit (HOSPITAL_COMMUNITY): Payer: Self-pay

## 2021-11-25 VITALS — BP 108/74 | HR 87 | Ht 62.0 in | Wt 205.0 lb

## 2021-11-25 DIAGNOSIS — R0789 Other chest pain: Secondary | ICD-10-CM | POA: Diagnosis not present

## 2021-11-25 DIAGNOSIS — I10 Essential (primary) hypertension: Secondary | ICD-10-CM | POA: Diagnosis not present

## 2021-11-25 DIAGNOSIS — E781 Pure hyperglyceridemia: Secondary | ICD-10-CM

## 2021-11-25 MED ORDER — LOSARTAN POTASSIUM 25 MG PO TABS
25.0000 mg | ORAL_TABLET | Freq: Every day | ORAL | 3 refills | Status: DC
Start: 2021-11-25 — End: 2022-02-28
  Filled 2021-11-25: qty 90, 90d supply, fill #0

## 2021-11-25 NOTE — Patient Instructions (Signed)
Medication Instructions:  Your physician has recommended you make the following change in your medication:  1) DECREASE Losartan 25mg  once daily  *If you need a refill on your cardiac medications before your next appointment, please call your pharmacy*   Lab Work: NONE If you have labs (blood work) drawn today and your tests are completely normal, you will receive your results only by: [place] (if you have MyChart) OR A paper copy in the mail If you have any lab test that is abnormal or we need to change your treatment, we will call you to review the results.   Testing/Procedures: NONE  Follow-Up: At [facility], you and your health needs are our priority.  As part of our continuing mission to provide you with exceptional heart care, we have created designated Provider Care Teams.  These Care Teams include your primary Cardiologist (physician) and Advanced Practice Providers (APPs -  Physician Assistants and Nurse Practitioners) who all work together to provide you with the care you need, when you need it.  We recommend signing up for the patient portal called "MyChart".  Sign up information is provided on this After Visit Summary.  MyChart is used to connect with patients for Virtual Visits (Telemedicine).  Patients are able to view lab/test results, encounter notes, upcoming appointments, etc.  Non-urgent messages can be sent to your provider as well.   To learn more about what you can do with MyChart, go to [email or web address].    Your next appointment:   1 year(s)  The format for your next appointment:   In Person  Provider:   Mertie Moores, MD or PA, or NP

## 2021-12-14 ENCOUNTER — Other Ambulatory Visit (HOSPITAL_COMMUNITY): Payer: Self-pay

## 2021-12-30 ENCOUNTER — Ambulatory Visit (INDEPENDENT_AMBULATORY_CARE_PROVIDER_SITE_OTHER): Payer: 59 | Admitting: Family

## 2021-12-30 ENCOUNTER — Other Ambulatory Visit (HOSPITAL_COMMUNITY): Payer: Self-pay

## 2021-12-30 ENCOUNTER — Encounter: Payer: Self-pay | Admitting: Family

## 2021-12-30 VITALS — BP 115/68 | HR 86 | Temp 98.0°F | Ht 62.0 in | Wt 195.6 lb

## 2021-12-30 DIAGNOSIS — E785 Hyperlipidemia, unspecified: Secondary | ICD-10-CM | POA: Diagnosis not present

## 2021-12-30 DIAGNOSIS — E119 Type 2 diabetes mellitus without complications: Secondary | ICD-10-CM

## 2021-12-30 DIAGNOSIS — Z0000 Encounter for general adult medical examination without abnormal findings: Secondary | ICD-10-CM

## 2021-12-30 DIAGNOSIS — I10 Essential (primary) hypertension: Secondary | ICD-10-CM

## 2021-12-30 DIAGNOSIS — E781 Pure hyperglyceridemia: Secondary | ICD-10-CM | POA: Diagnosis not present

## 2021-12-30 LAB — COMPREHENSIVE METABOLIC PANEL
ALT: 23 U/L (ref 0–35)
AST: 21 U/L (ref 0–37)
Albumin: 4.1 g/dL (ref 3.5–5.2)
Alkaline Phosphatase: 49 U/L (ref 39–117)
BUN: 23 mg/dL (ref 6–23)
CO2: 31 mEq/L (ref 19–32)
Calcium: 9.9 mg/dL (ref 8.4–10.5)
Chloride: 102 mEq/L (ref 96–112)
Creatinine, Ser: 1.2 mg/dL (ref 0.40–1.20)
GFR: 52.72 mL/min — ABNORMAL LOW (ref >60.00)
Glucose, Bld: 100 mg/dL — ABNORMAL HIGH (ref 70–99)
Potassium: 3.3 mEq/L — ABNORMAL LOW (ref 3.5–5.1)
Sodium: 140 mEq/L (ref 135–145)
Total Bilirubin: 0.4 mg/dL (ref 0.2–1.2)
Total Protein: 6.3 g/dL (ref 6.0–8.3)

## 2021-12-30 LAB — LIPID PANEL
Cholesterol: 113 mg/dL (ref 0–200)
HDL: 31.5 mg/dL — ABNORMAL LOW (ref >39.00)
LDL Cholesterol: 44 mg/dL (ref 0–99)
NonHDL: 81.85
Total CHOL/HDL Ratio: 4
Triglycerides: 187 mg/dL — ABNORMAL HIGH (ref 0.0–149.0)
VLDL: 37.4 mg/dL (ref 0.0–40.0)

## 2021-12-30 LAB — HEMOGLOBIN A1C: Hgb A1c MFr Bld: 6 % (ref 4.6–6.5)

## 2021-12-30 MED ORDER — ONDANSETRON HCL 4 MG PO TABS
4.0000 mg | ORAL_TABLET | Freq: Three times a day (TID) | ORAL | 0 refills | Status: DC | PRN
Start: 2021-12-30 — End: 2022-02-28
  Filled 2021-12-30: qty 20, 7d supply, fill #0

## 2021-12-30 NOTE — Assessment & Plan Note (Signed)
BP Readings from Last 3 Encounters:  ?12/30/21 115/68  ?11/25/21 108/74  ?08/23/21 (!) 140/96  ? ?BP is at goal. Continue losartan.  ?

## 2021-12-30 NOTE — Progress Notes (Signed)
? ?Subjective:  ? ?By signing my name below, I, Carylon Perches, attest that this documentation has been prepared under the direction and in the presence of Debbrah Alar NP, 12/30/2021   ? ? Patient ID: Jane Montes, female    DOB: 10/03/1971, 51 y.o.   MRN: [id number] ? ?Chief Complaint  ?Patient presents with  ? Annual Exam  ?  CPE  ? ? ?HPI ?Patient is in today for a comprehensive physical exam. ? ?Weight Loss - She reports that she has lost weight. She is currently taking 2.4 MG of Wegovy. She reports having occasional abdominal pain and nausea while taking Wegovy.  ?Wt Readings from Last 3 Encounters:  ?12/30/21 195 lb 9.6 oz (88.7 kg)  ?11/25/21 205 lb (93 kg)  ?08/23/21 214 lb 6.4 oz (97.3 kg)  ? ? ?A1C Levels - She is requesting to get her A1C levels checked.  ?Lab Results  ?Component Value Date  ? HGBA1C 6.7 (H) 09/12/2021  ? ? ?She denies having any fever, ear pain, new muscle pain, joint pain, new moles, congestion, sinus pain, sore throat, palpations, wheezing, v/d, constipation, blood in stool, dysuria, frequency, hematuria at this time. ? ?Social History: She reports that her mother has recently been having seizures. Her mother is reported of having Vascular Dementia. ?Pap Smear: Last completed 07/25/2019 ?Mammogram: Last completed 07/25/2019. She is scheduling an appointment to get her mammogram updated. ?Immunizations: She is interested in receiving the Pneumonia vaccine. ?Diet: She is eating healthier. ?Exercise: She is regularly exercising.  ?Dental: She is UTD on dental exams. ?Vision: She is UTD on vision exams.  ? ?Health Maintenance Due  ?Topic Date Due  ? OPHTHALMOLOGY EXAM  Never done  ? COLONOSCOPY (Pts 45-61yr Insurance coverage will need to be confirmed)  Never done  ? COVID-19 Vaccine (3 - Booster for Pfizer series) 01/08/2020  ? Zoster Vaccines- Shingrix (1 of 2) Never done  ? INFLUENZA VACCINE  05/09/2021  ? MAMMOGRAM  07/27/2021  ? ? ?Past Medical History:  ?Diagnosis Date  ?  Adenoma of right adrenal gland 10/12/2018  ? Noted on MRI Abd, measuring 2.2 x 2.4 x 2.7 cm  ? Anxiety   ? Aortic atherosclerosis ([place] 10/10/2018  ? Noted on [place] ? Fatty liver 10/12/2018  ? Noted on MRI Abd  ? Fatty liver 09/29/2020  ? GERD (gastroesophageal reflux disease) 09/16/2015  ? Mod. noted on KUB  ? Heart palpitations   ? High cholesterol   ? History of chest pain   ? History of dizziness   ? History of hiatal hernia 09/16/2015  ? Small, noted on KUB  ? History of kidney stones   ? History of migraine   ? Hydronephrosis of right kidney 10/12/2018  ? Mild, noted on MRI Abd  ? Hyperlipidemia   ? Left anterior fascicular block (LAFB) 01/10/2013  ? noted on EKG  ? Left kidney mass 10/12/2018  ? Hyperechoic mass mid left kidney measuring 1.0 x 1.0 x 1.2 cm, noted on [place]  ? Nephrolithiasis 10/12/2018  ? Right, Noted on MRI Abd  ? Pelvic pain   ? Chronic, right lower quadrant  ? Wears contact lenses   ? Wears glasses   ? ? ?Past Surgical History:  ?Procedure Laterality Date  ? ABDOMINAL HYSTERECTOMY    ? partial  ? CHOLECYSTECTOMY N/A 11/18/2020  ? Procedure: LAPAROSCOPIC CHOLECYSTECTOMY;  Surgeon: BCoralie Keens MD;  Location[place]  Service: General;  Laterality: N/A;  ? ENDOMETRIAL  ABLATION    ? Pelvic  ? HOLMIUM LASER APPLICATION Right 05/14/7618  ? Procedure: HOLMIUM LASER APPLICATION;  Surgeon: Festus Aloe, MD;  [facility];  Service: Urology;  Laterality: Right;  ? URETEROSCOPY WITH HOLMIUM LASER LITHOTRIPSY Right 11/15/2018  ? Procedure: CYSTOSCOPY, RETROGRADE, URETEROSCOPY WITH HOLMIUM LASER LITHOTRIPSY/STENT PLACEMENT;  Surgeon: Festus Aloe, MD;  Location: [facility];  Service: Urology;  Laterality: Right;  ? VAGINAL HYSTERECTOMY  2007  ? Partial  ? WISDOM TOOTH EXTRACTION    ? ? ?Family History  ?Problem Relation Age of Onset  ? Diabetes Mother   ?     [id number] (recent diagnosis) causes thrombocytosis  ? Heart attack  Mother 38  ? Heart disease Mother   ? Seizures Mother   ? Dementia Mother   ? Hypertension Father   ? COPD Father   ? Bipolar disorder Father   ? Parkinson's disease Father   ? Cervical cancer Sister   ? Alcohol abuse Brother   ? COPD Brother   ? Hyperlipidemia Brother   ? Hypertension Brother   ? Stroke Brother   ? Heart attack Brother   ? COPD Maternal Grandmother   ? Hyperlipidemia Maternal Grandmother   ? Heart attack Maternal Grandmother   ? Depression Paternal Grandmother   ? Heart disease Paternal Grandmother   ? Heart attack Paternal Grandmother   ? ? ?Social History  ? ?Socioeconomic History  ? Marital status: Legally Separated  ?  Spouse name: Not on file  ? Number of children: Not on file  ? Years of education: Not on file  ? Highest education level: Not on file  ?Occupational History  ? Occupation: Nurse  ?  Employer: [employer]  ?Tobacco Use  ? Smoking status: Never  ? Smokeless tobacco: Never  ?Vaping Use  ? Vaping Use: Never used  ?Substance and Sexual Activity  ? Alcohol use: Yes  ?  Comment: occ  ? Drug use: No  ? Sexual activity: Not Currently  ?  Birth control/protection: Surgical  ?Other Topics Concern  ? Not on file  ?Social History Narrative  ? Works for Heart failure team at [employer]  ? 2 daughters  ? Separated  ? Enjoys reading/working out, spending time outdoors.    ? ?Social Determinants of Health  ? ?Financial Resource Strain: Not on file  ?Food Insecurity: Not on file  ?Transportation Needs: Not on file  ?Physical Activity: Not on file  ?Stress: Not on file  ?Social Connections: Not on file  ?Intimate Partner Violence: Not on file  ? ? ?Outpatient Medications Prior to Visit  ?Medication Sig Dispense Refill  ? atorvastatin (LIPITOR) 20 MG tablet Take 1 tablet (20 mg total) by mouth daily. 90 tablet 1  ? fenofibrate (TRICOR) 145 MG tablet Take 1 tablet (145 mg total) by mouth daily. 90 tablet 1  ? indapamide (LOZOL) 1.25 MG tablet Take 1 tablet (1.25 mg total) by mouth every morning. 30 tablet  3  ? losartan (COZAAR) 25 MG tablet Take 1 tablet (25 mg total) by mouth daily. 90 tablet 3  ? nitroGLYCERIN (NITROSTAT) 0.4 MG SL tablet Take 1 tablet, under your tongue, while sitting.  If no relief of pain may repeat NTG, one tab every 5 minutes up to 3 tablets total over 15 minutes. 25 tablet 3  ? [START ON 02/17/2022] Semaglutide-Weight Management 2.4 MG/0.75ML SOAJ Inject 2.4 mg into the skin once a week. 3 mL 11  ? ondansetron (ZOFRAN) 4  MG tablet Take 1 tablet (4 mg total) by mouth every 8 (eight) hours as needed for nausea or vomiting. 20 tablet 0  ? ?No facility-administered medications prior to visit.  ? ? ?Allergies  ?Allergen Reactions  ? Cephalexin Nausea Only and Rash  ? ? ?Review of Systems  ?Constitutional:  Negative for fever.  ?HENT:  Negative for congestion, ear pain, sinus pain and sore throat.   ?Respiratory:  Negative for wheezing.   ?Cardiovascular:  Negative for palpitations.  ?Gastrointestinal:  Positive for abdominal pain and nausea. Negative for blood in stool, constipation, diarrhea and vomiting.  ?Genitourinary:  Negative for dysuria, frequency and hematuria.  ?Musculoskeletal:  Negative for joint pain and myalgias.  ?Skin:   ?     (-) [place]  ?Neurological:  Negative for headaches.  ?Psychiatric/Behavioral:  Negative for depression. The patient is not nervous/anxious.   ? ?   ?Objective:  ?  ?Physical Exam ?Constitutional:   ?   General: She is not in acute distress. ?   Appearance: Normal appearance. She is not ill-appearing.  ?HENT:  ?   Head: Normocephalic and atraumatic.  ?   Right Ear: Tympanic membrane, ear canal and external ear normal.  ?   Left Ear: Tympanic membrane, ear canal and external ear normal.  ?Eyes:  ?   Extraocular Movements: Extraocular movements intact.  ?   Pupils: Pupils are equal, round, and reactive to light.  ?Cardiovascular:  ?   Rate and Rhythm: Normal rate and regular rhythm.  ?   Heart sounds: Normal heart sounds. No murmur heard. ?  No gallop.   ?Pulmonary:  ?   Effort: Pulmonary effort is normal. No respiratory distress.  ?   Breath sounds: Normal breath sounds. No wheezing or rales.  ?Abdominal:  ?   General: Bowel sounds are normal. There is no distens

## 2021-12-30 NOTE — Assessment & Plan Note (Addendum)
Wt Readings from Last 3 Encounters:  ?12/30/21 195 lb 9.6 oz (88.7 kg)  ?11/25/21 205 lb (93 kg)  ?08/23/21 214 lb 6.4 oz (97.3 kg)  ? ?Discussed healthy diet, exercise, weight loss. Refer for colo. She will complete mammo at her GYN. Declines covid booster. Tetanus up to date.  Will schedule nurse visit for prevnar 13 and Shingrix #1. ?

## 2021-12-30 NOTE — Addendum Note (Signed)
Addended by: Manuela Schwartz on: 12/30/2021 08:36 AM ? ? Modules accepted: Orders ? ?

## 2021-12-30 NOTE — Assessment & Plan Note (Signed)
Continue lipitor/tricor. LDL at goal.  ? ?Lab Results  ?Component Value Date  ? CHOL 154 03/30/2021  ? HDL 42.90 03/30/2021  ? [place] 68 01/19/2020  ? LDLDIRECT 87.0 03/30/2021  ? TRIG 204.0 (H) 03/30/2021  ? CHOLHDL 4 03/30/2021  ? ? ?

## 2021-12-30 NOTE — Assessment & Plan Note (Addendum)
This is a new diagnosis for her.  She is now on ozempic which is being prescribed by her cardiologist and has had some good weight loss on this medication.  Discussed need for prevnar 13 (she will schedule at a later date), annual eye exam. Foot exam completed today. She is already working a exercise and healthy diet. With the ozempic and weight loss, I expect her A1C to be improved. I gave her a refill on zofran to use prn nausea with the Ozempic.  ?

## 2022-01-02 ENCOUNTER — Other Ambulatory Visit (INDEPENDENT_AMBULATORY_CARE_PROVIDER_SITE_OTHER): Payer: 59

## 2022-01-02 DIAGNOSIS — E119 Type 2 diabetes mellitus without complications: Secondary | ICD-10-CM | POA: Diagnosis not present

## 2022-01-02 LAB — MICROALBUMIN / CREATININE URINE RATIO
Creatinine,U: 260 mg/dL
Microalb Creat Ratio: 0.5 mg/g (ref 0.0–30.0)
Microalb, Ur: 1.3 mg/dL (ref 0.0–1.9)

## 2022-01-02 NOTE — Progress Notes (Signed)
No charge today. Charges placed in last week's encounter but pt could not obtain specimen and returned it today. ?

## 2022-01-11 ENCOUNTER — Other Ambulatory Visit (HOSPITAL_COMMUNITY): Payer: Self-pay

## 2022-01-12 ENCOUNTER — Other Ambulatory Visit (HOSPITAL_COMMUNITY): Payer: Self-pay

## 2022-01-13 ENCOUNTER — Other Ambulatory Visit (HOSPITAL_COMMUNITY): Payer: Self-pay

## 2022-01-16 ENCOUNTER — Other Ambulatory Visit (HOSPITAL_COMMUNITY): Payer: Self-pay

## 2022-01-16 MED ORDER — INDAPAMIDE 1.25 MG PO TABS
1.2500 mg | ORAL_TABLET | Freq: Every morning | ORAL | 3 refills | Status: DC
  Filled 2022-01-16: qty 30, 30d supply, fill #0
  Filled 2022-02-12: qty 30, 30d supply, fill #1
  Filled 2022-03-14: qty 30, 30d supply, fill #2
  Filled 2022-04-13: qty 30, 30d supply, fill #3

## 2022-01-17 ENCOUNTER — Ambulatory Visit (INDEPENDENT_AMBULATORY_CARE_PROVIDER_SITE_OTHER): Payer: 59

## 2022-01-17 DIAGNOSIS — Z23 Encounter for immunization: Secondary | ICD-10-CM | POA: Diagnosis not present

## 2022-01-17 DIAGNOSIS — Z0000 Encounter for general adult medical examination without abnormal findings: Secondary | ICD-10-CM

## 2022-01-17 NOTE — Progress Notes (Signed)
Jane Montes is a 51 y.o. female presents to the office today for Shingles  injections, per physician's orders. ?Original order: [name]  Patient tolerated injection. Left deltoid PVC 13  ? ?Pt present for shingles per physican's order  ?Pt tolerated well right deltoid  ?

## 2022-01-19 ENCOUNTER — Encounter: Payer: Self-pay | Admitting: Family

## 2022-02-13 ENCOUNTER — Other Ambulatory Visit (HOSPITAL_COMMUNITY): Payer: Self-pay

## 2022-02-17 ENCOUNTER — Telehealth: Payer: Self-pay | Admitting: Pharmacist

## 2022-02-17 NOTE — Telephone Encounter (Signed)
Received fax that [facility] prior Josem Kaufmann is approved through 02/16/23. ?

## 2022-02-20 ENCOUNTER — Other Ambulatory Visit (HOSPITAL_COMMUNITY): Payer: Self-pay

## 2022-02-20 ENCOUNTER — Encounter: Payer: Self-pay | Admitting: Pharmacist

## 2022-02-28 ENCOUNTER — Ambulatory Visit (AMBULATORY_SURGERY_CENTER): Payer: Self-pay

## 2022-02-28 ENCOUNTER — Other Ambulatory Visit: Payer: Self-pay

## 2022-02-28 ENCOUNTER — Other Ambulatory Visit (HOSPITAL_COMMUNITY): Payer: Self-pay

## 2022-02-28 VITALS — Ht 62.0 in | Wt 182.0 lb

## 2022-02-28 DIAGNOSIS — Z1211 Encounter for screening for malignant neoplasm of colon: Secondary | ICD-10-CM

## 2022-02-28 MED ORDER — NA SULFATE-K SULFATE-MG SULF 17.5-3.13-1.6 GM/177ML PO SOLN
1.0000 | Freq: Once | ORAL | 0 refills | Status: AC
  Filled 2022-02-28: qty 354, 1d supply, fill #0

## 2022-02-28 NOTE — Progress Notes (Signed)
Denies allergies to eggs or soy products. Denies complication of anesthesia or sedation. Denies use of weight loss medication. Denies use of O2.   Emmi instructions given for colonoscopy.  

## 2022-03-02 ENCOUNTER — Other Ambulatory Visit (HOSPITAL_COMMUNITY): Payer: Self-pay

## 2022-03-02 ENCOUNTER — Encounter: Payer: Self-pay | Admitting: Internal Medicine

## 2022-03-07 ENCOUNTER — Ambulatory Visit (AMBULATORY_SURGERY_CENTER): Payer: 59 | Admitting: Internal Medicine

## 2022-03-07 ENCOUNTER — Encounter: Payer: Self-pay | Admitting: Internal Medicine

## 2022-03-07 VITALS — BP 117/77 | HR 67 | Temp 96.9°F | Resp 11 | Ht 62.0 in | Wt 179.0 lb

## 2022-03-07 DIAGNOSIS — D123 Benign neoplasm of transverse colon: Secondary | ICD-10-CM

## 2022-03-07 DIAGNOSIS — D124 Benign neoplasm of descending colon: Secondary | ICD-10-CM | POA: Diagnosis not present

## 2022-03-07 DIAGNOSIS — K635 Polyp of colon: Secondary | ICD-10-CM | POA: Diagnosis not present

## 2022-03-07 DIAGNOSIS — Z1211 Encounter for screening for malignant neoplasm of colon: Secondary | ICD-10-CM

## 2022-03-07 DIAGNOSIS — D125 Benign neoplasm of sigmoid colon: Secondary | ICD-10-CM

## 2022-03-07 MED ORDER — SODIUM CHLORIDE 0.9 % IV SOLN: 500.0000 mL | Freq: Once | INTRAVENOUS | Status: DC

## 2022-03-07 NOTE — Op Note (Signed)
[facility] Patient Name: Jane Montes Procedure Date: 03/07/2022 9:30 AM MRN: [id number] Endoscopist: Sonny Masters "Jane Montes ,  Age: 51 Referring MD:  Date of Birth: 1971/06/10 Gender: Female Account #: [id number] Procedure:                Colonoscopy Indications:              Screening for colorectal malignant neoplasm, This                            is the patient's first colonoscopy Medicines:                Monitored Anesthesia Care Procedure:                Pre-Anesthesia Assessment:                           - Prior to the procedure, a History and Physical                            was performed, and patient medications and                            allergies were reviewed. The patient's tolerance of                            previous anesthesia was also reviewed. The risks                            and benefits of the procedure and the sedation                            options and risks were discussed with the patient.                            All questions were answered, and informed consent                            was obtained. Prior Anticoagulants: The patient has                            taken no previous anticoagulant or antiplatelet                            agents. ASA Grade Assessment: III - A patient with                            severe systemic disease. After reviewing the risks                            and benefits, the patient was deemed in                            satisfactory condition to undergo the procedure.  After obtaining informed consent, the colonoscope                            was passed under direct vision. Throughout the                            procedure, the patient's blood pressure, pulse, and                            oxygen saturations were monitored continuously. The                            CF [id number] #[id number] was introduced through the anus                            and advanced to the  the terminal ileum. The                            colonoscopy was performed without difficulty. The                            patient tolerated the procedure well. The quality                            of the bowel preparation was good. The terminal                            ileum, ileocecal valve, appendiceal orifice, and                            rectum were photographed. Scope In: 9:35:17 AM Scope Out: 9:54:37 AM Scope Withdrawal Time: 0 hours 14 minutes 59 seconds  Total Procedure Duration: 0 hours 19 minutes 20 seconds  Findings:                 The terminal ileum appeared normal.                           Three sessile polyps were found in the sigmoid                            colon, descending colon and transverse colon. The                            polyps were 2 to 4 mm in size. These polyps were                            removed with a cold snare. Resection and retrieval                            were complete.                           Multiple small and large-mouthed diverticula were  found in the sigmoid colon and descending colon.                           Non-bleeding internal hemorrhoids were found during                            retroflexion. Complications:            No immediate complications. Estimated Blood Loss:     Estimated blood loss was minimal. Impression:               - The examined portion of the ileum was normal.                           - Three 2 to 4 mm polyps in the sigmoid colon, in                            the descending colon and in the transverse colon,                            removed with a cold snare. Resected and retrieved.                           - Diverticulosis in the sigmoid colon and in the                            descending colon.                           - Non-bleeding internal hemorrhoids. Recommendation:           - Discharge patient to home (with escort).                           - Await  pathology results.                           - The findings and recommendations were discussed                            with the patient. [address]Jane Montes,  03/07/2022 9:58:55 AM

## 2022-03-07 NOTE — Patient Instructions (Signed)
Please read handouts provided. Await pathology results.    YOU HAD AN ENDOSCOPIC PROCEDURE TODAY AT [facility]:   Refer to the procedure report that was given to you for any specific questions about what was found during the examination.  If the procedure report does not answer your questions, please call your gastroenterologist to clarify.  If you requested that your care partner not be given the details of your procedure findings, then the procedure report has been included in a sealed envelope for you to review at your convenience later.  YOU SHOULD EXPECT: Some feelings of bloating in the abdomen. Passage of more gas than usual.  Walking can help get rid of the air that was put into your GI tract during the procedure and reduce the bloating. If you had a lower endoscopy (such as a colonoscopy or flexible sigmoidoscopy) you may notice spotting of blood in your stool or on the toilet paper. If you underwent a bowel prep for your procedure, you may not have a normal bowel movement for a few days.  Please Note:  You might notice some irritation and congestion in your nose or some drainage.  This is from the oxygen used during your procedure.  There is no need for concern and it should clear up in a day or so.  SYMPTOMS TO REPORT IMMEDIATELY:  Following lower endoscopy (colonoscopy or flexible sigmoidoscopy):  Excessive amounts of blood in the stool  Significant tenderness or worsening of abdominal pains  Swelling of the abdomen that is new, acute  Fever of 100F or higher   For urgent or emergent issues, a gastroenterologist can be reached at any hour by calling [phone]. Do not use MyChart messaging for urgent concerns.    DIET:  We do recommend a small meal at first, but then you may proceed to your regular diet.  Drink plenty of fluids but you should avoid alcoholic beverages for 24 hours.  ACTIVITY:  You should plan to take it easy for the rest of today and you  should NOT DRIVE or use heavy machinery until tomorrow (because of the sedation medicines used during the test).    FOLLOW UP: Our staff will call the number listed on your records 48-72 hours following your procedure to check on you and address any questions or concerns that you may have regarding the information given to you following your procedure. If we do not reach you, we will leave a message.  We will attempt to reach you two times.  During this call, we will ask if you have developed any symptoms of COVID 19. If you develop any symptoms (ie: fever, flu-like symptoms, shortness of breath, cough etc.) before then, please call [phone].  If you test positive for Covid 19 in the 2 weeks post procedure, please call and report this information to [place].    If any biopsies were taken you will be contacted by phone or by letter within the next 1-3 weeks.  Please call us at [phone] if you have not heard about the biopsies in 3 weeks.    SIGNATURES/CONFIDENTIALITY: You and/or your care partner have signed paperwork which will be entered into your electronic medical record.  These signatures attest to the fact that that the information above on your After Visit Summary has been reviewed and is understood.  Full responsibility of the confidentiality of this discharge information lies with you and/or your care-partner.

## 2022-03-07 NOTE — Progress Notes (Signed)
GASTROENTEROLOGY PROCEDURE H&P NOTE   Primary Care Physician: Debbrah Alar, NP    Reason for Procedure:   Colon cancer screening  Plan:    Colonoscopy  Patient is appropriate for endoscopic procedure(s) in the ambulatory ([place]) setting.  The nature of the procedure, as well as the risks, benefits, and alternatives were carefully and thoroughly reviewed with the patient. Ample time for discussion and questions allowed. The patient understood, was satisfied, and agreed to proceed.     HPI: Jane Montes is a 51 y.o. female who presents for colonoscopy for colon cancer screening. Denies blood in stools, changes in bowel habits, weight loss. Denies fam hx of colon cancer.  Past Medical History:  Diagnosis Date   Adenoma of right adrenal gland 10/12/2018   Noted on MRI Abd, measuring 2.2 x 2.4 x 2.7 cm   Anxiety    Aortic atherosclerosis ([place]) 10/10/2018   Noted on [place]   Diabetes mellitus without complication ([place])    Fatty liver 10/12/2018   Noted on MRI Abd   Fatty liver 09/29/2020   GERD (gastroesophageal reflux disease) 09/16/2015   Mod. noted on KUB   Heart palpitations    High cholesterol    History of chest pain    History of dizziness    History of hiatal hernia 09/16/2015   Small, noted on KUB   History of kidney stones    History of migraine    Hydronephrosis of right kidney 10/12/2018   Mild, noted on MRI Abd   Hyperlipidemia    Left anterior fascicular block (LAFB) 01/10/2013   noted on EKG   Left kidney mass 10/12/2018   Hyperechoic mass mid left kidney measuring 1.0 x 1.0 x 1.2 cm, noted on [place] AB   Nephrolithiasis 10/12/2018   Right, Noted on MRI Abd   Pelvic pain    Chronic, right lower quadrant   Wears contact lenses    Wears glasses     Past Surgical History:  Procedure Laterality Date   ABDOMINAL HYSTERECTOMY     partial   CHOLECYSTECTOMY N/A 11/18/2020   Procedure: LAPAROSCOPIC CHOLECYSTECTOMY;  Surgeon: Coralie Keens, MD;   Location: [facility];  Service: General;  Laterality: N/A;   ENDOMETRIAL ABLATION     Pelvic   HOLMIUM LASER APPLICATION Right 10/12/863   Procedure: HOLMIUM LASER APPLICATION;  Surgeon: Festus Aloe, MD;  Location: [facility];  Service: Urology;  Laterality: Right;   URETEROSCOPY WITH HOLMIUM LASER LITHOTRIPSY Right 11/15/2018   Procedure: CYSTOSCOPY, RETROGRADE, URETEROSCOPY WITH HOLMIUM LASER LITHOTRIPSY/STENT PLACEMENT;  Surgeon: Festus Aloe, MD;  Location: [facility];  Service: Urology;  Laterality: Right;   VAGINAL HYSTERECTOMY  2007   Partial   WISDOM TOOTH EXTRACTION      Prior to Admission medications   Medication Sig Start Date End Date Taking? Authorizing Provider  atorvastatin (LIPITOR) 20 MG tablet Take 1 tablet (20 mg total) by mouth daily. 10/06/21   Marrian Salvage, FNP  fenofibrate (TRICOR) 145 MG tablet Take 1 tablet (145 mg total) by mouth daily. 10/06/21   Marrian Salvage, FNP  indapamide (LOZOL) 1.25 MG tablet Take 1 tablet (1.25 mg total) by mouth every morning. 01/16/22     nitroGLYCERIN (NITROSTAT) 0.4 MG SL tablet Take 1 tablet, under your tongue, while sitting.  If no relief of pain may repeat NTG, one tab every 5 minutes up to 3 tablets total over 15 minutes. 08/23/21   Nahser, Wonda Cheng, MD  Semaglutide-Weight Management 2.4 MG/0.75ML SOAJ Inject 2.4 mg into the skin once a week. 02/17/22   Nahser, Wonda Cheng, MD  DULoxetine (CYMBALTA) 30 MG capsule 1 cap PO once daily for 2 weeks, then 1 cap PO QOD x 1 week then stop 08/25/20 09/29/20  Debbrah Alar, NP    Current Outpatient Medications  Medication Sig Dispense Refill   atorvastatin (LIPITOR) 20 MG tablet Take 1 tablet (20 mg total) by mouth daily. 90 tablet 1   fenofibrate (TRICOR) 145 MG tablet Take 1 tablet (145 mg total) by mouth daily. 90 tablet 1   indapamide (LOZOL) 1.25 MG tablet Take 1 tablet (1.25 mg total) by mouth every morning. 30  tablet 3   nitroGLYCERIN (NITROSTAT) 0.4 MG SL tablet Take 1 tablet, under your tongue, while sitting.  If no relief of pain may repeat NTG, one tab every 5 minutes up to 3 tablets total over 15 minutes. 25 tablet 3   Semaglutide-Weight Management 2.4 MG/0.75ML SOAJ Inject 2.4 mg into the skin once a week. 3 mL 11   Current Facility-Administered Medications  Medication Dose Route Frequency Provider Last Rate Last Admin   0.9 %  sodium chloride infusion  500 mL Intravenous Once Sharyn Creamer, MD        Allergies as of 03/07/2022 - Review Complete 02/28/2022  Allergen Reaction Noted   Cephalexin Nausea Only and Rash 11/21/2019    Family History  Problem Relation Age of Onset   Diabetes Mother        Vickki Muff (recent diagnosis) causes thrombocytosis   Heart attack Mother 45   Heart disease Mother    Seizures Mother    Dementia Mother    Hypertension Father    COPD Father    Bipolar disorder Father    Parkinson's disease Father    Cervical cancer Sister    Alcohol abuse Brother    COPD Brother    Hyperlipidemia Brother    Hypertension Brother    Stroke Brother    Heart attack Brother    COPD Maternal Grandmother    Hyperlipidemia Maternal Grandmother    Heart attack Maternal Grandmother    Depression Paternal Grandmother    Heart disease Paternal Grandmother    Heart attack Paternal Grandmother    Colon cancer Neg Hx    Esophageal cancer Neg Hx    Stomach cancer Neg Hx    Rectal cancer Neg Hx     Social History   Socioeconomic History   Marital status: Legally Separated    Spouse name: Not on file   Number of children: Not on file   Years of education: Not on file   Highest education level: Not on file  Occupational History   Occupation: Nurse    Employer: [employer]  Tobacco Use   Smoking status: Never   Smokeless tobacco: Never  Vaping Use   Vaping Use: Never used  Substance and Sexual Activity   Alcohol use: Yes    Comment: occ   Drug use: No    Sexual activity: Not Currently    Birth control/protection: Surgical  Other Topics Concern   Not on file  Social History Narrative   Works for Heart failure team at [employer]   2 daughters   Separated   Enjoys reading/working out, spending time outdoors.     Social Determinants of Health   Financial Resource Strain: Not on file  Food Insecurity: Not on file  Transportation Needs: Not on file  Physical Activity: Not  on file  Stress: Not on file  Social Connections: Not on file  Intimate Partner Violence: Not on file    Physical Exam: Vital signs in last 24 hours: There were no vitals taken for this visit. GEN: NAD EYE: Sclerae anicteric ENT: MMM CV: Non-tachycardic Pulm: No increased work of breathing GI: Soft, NT/ND NEURO:  Alert & Oriented   Christia Reading, MD [facility]  03/07/2022 9:00 AM

## 2022-03-07 NOTE — Progress Notes (Signed)
Pt's states no medical or surgical changes since previsit or office visit. 

## 2022-03-07 NOTE — Progress Notes (Signed)
A and O x3. Report to RN. Tolerated MAC anesthesia well. 

## 2022-03-07 NOTE — Progress Notes (Signed)
Called to room to assist during endoscopic procedure.  Patient ID and intended procedure confirmed with present staff. Received instructions for my participation in the procedure from the performing physician.  

## 2022-03-08 ENCOUNTER — Telehealth: Payer: Self-pay

## 2022-03-08 NOTE — Telephone Encounter (Signed)
  Follow up Call-     03/07/2022    9:01 AM  Call back number  Post procedure Call Back phone  # [phone]  Permission to leave phone message Yes     Patient questions:  Do you have a fever, pain , or abdominal swelling? No. Pain Score  0 *  Have you tolerated food without any problems? Yes.    Have you been able to return to your normal activities? Yes.    Do you have any questions about your discharge instructions: Diet   No. Medications  No. Follow up visit  No.  Do you have questions or concerns about your Care? No.  Actions: * If pain score is 4 or above: No action needed, pain <4.

## 2022-03-15 ENCOUNTER — Other Ambulatory Visit (HOSPITAL_COMMUNITY): Payer: Self-pay

## 2022-03-16 ENCOUNTER — Encounter: Payer: Self-pay | Admitting: Internal Medicine

## 2022-03-28 DIAGNOSIS — Z01419 Encounter for gynecological examination (general) (routine) without abnormal findings: Secondary | ICD-10-CM | POA: Diagnosis not present

## 2022-03-28 DIAGNOSIS — Z1231 Encounter for screening mammogram for malignant neoplasm of breast: Secondary | ICD-10-CM | POA: Diagnosis not present

## 2022-04-03 ENCOUNTER — Other Ambulatory Visit (HOSPITAL_COMMUNITY): Payer: Self-pay

## 2022-04-03 ENCOUNTER — Telehealth: Payer: Self-pay | Admitting: Family

## 2022-04-03 ENCOUNTER — Ambulatory Visit: Payer: 59 | Admitting: Family

## 2022-04-03 DIAGNOSIS — E669 Obesity, unspecified: Secondary | ICD-10-CM | POA: Diagnosis not present

## 2022-04-03 DIAGNOSIS — E119 Type 2 diabetes mellitus without complications: Secondary | ICD-10-CM

## 2022-04-03 DIAGNOSIS — I10 Essential (primary) hypertension: Secondary | ICD-10-CM

## 2022-04-03 DIAGNOSIS — E663 Overweight: Secondary | ICD-10-CM | POA: Insufficient documentation

## 2022-04-03 DIAGNOSIS — E781 Pure hyperglyceridemia: Secondary | ICD-10-CM | POA: Diagnosis not present

## 2022-04-03 MED ORDER — FENOFIBRATE 145 MG PO TABS
145.0000 mg | ORAL_TABLET | Freq: Every day | ORAL | 1 refills | Status: DC
  Filled 2022-04-03 – 2022-04-13 (×2): qty 90, 90d supply, fill #0
  Filled 2022-07-10: qty 90, 90d supply, fill #1

## 2022-04-03 MED ORDER — ATORVASTATIN CALCIUM 20 MG PO TABS
20.0000 mg | ORAL_TABLET | Freq: Every day | ORAL | 1 refills | Status: DC
  Filled 2022-04-03 – 2022-04-13 (×2): qty 90, 90d supply, fill #0
  Filled 2022-07-10: qty 90, 90d supply, fill #1

## 2022-04-03 NOTE — Progress Notes (Signed)
Subjective:   By signing my name below, I, Shehryar Baig, attest that this documentation has been prepared under the direction and in the presence of Sandford Craze, NP 04/03/2022    Patient ID: Jane Montes, female    DOB: 04/21/1971, 51 y.o.   MRN: [id number]  Chief Complaint  Patient presents with   Hypertension    Here for follow up   Diabetes    Here for follow up     Patient is in today for a follow up visit.   Weight- She continues taking wegovy and reports doing well while taking it. She does not have headache or nausea anymore while taking it. She reports losing 2 lb's since last visit. She is increasing her exercise as well. She currently weighs 183 lb's and has a BMI of 33. Wt Readings from Last 3 Encounters:  04/03/22 183 lb (83 kg)  03/07/22 179 lb (81.2 kg)  02/28/22 182 lb (82.6 kg)   Blood pressure- Her blood pressure is doing well during this visit. She is not taking any blood pressure medication at this time. She has a follow up visit with her cardiologist next month.  BP Readings from Last 3 Encounters:  04/03/22 118/83  03/07/22 117/77  12/30/21 115/68   Pulse Readings from Last 3 Encounters:  04/03/22 87  03/07/22 67  12/30/21 86   Cholesterol- She reports her last triglyceride levels were elevated. She continues taking 20 mg Lipitor daily PO and 145 mg fenofibrate daily PO and reports no new issues while taking it.  Lab Results  Component Value Date   CHOL 113 12/30/2021   HDL 31.50 (L) 12/30/2021   LDLCALC 44 12/30/2021   LDLDIRECT 87.0 03/30/2021   TRIG 187.0 (H) 12/30/2021   CHOLHDL 4 12/30/2021   Vision- She is UTD on vision care this year.   Immunizations- She has one shingrix vaccine and reports developing a reaction following the injection. She developed a painful "knot" on the site of the injection. She is hesitant about receiving the second dose and is not interested in receiving another one at this time. She reports doing well  after receiving the pneumonia vaccine.    Health Maintenance Due  Topic Date Due   OPHTHALMOLOGY EXAM  Never done   COVID-19 Vaccine (3 - Pfizer series) 01/08/2020    Past Medical History:  Diagnosis Date   Adenoma of right adrenal gland 10/12/2018   Noted on MRI Abd, measuring 2.2 x 2.4 x 2.7 cm   Anxiety    Aortic atherosclerosis (HCC) 10/10/2018   Noted on [place]   Diabetes mellitus without complication (HCC)    Fatty liver 10/12/2018   Noted on MRI Abd   Fatty liver 09/29/2020   GERD (gastroesophageal reflux disease) 09/16/2015   Mod. noted on KUB   Heart palpitations    High cholesterol    History of chest pain    History of dizziness    History of hiatal hernia 09/16/2015   Small, noted on KUB   History of kidney stones    History of migraine    Hydronephrosis of right kidney 10/12/2018   Mild, noted on MRI Abd   Hyperlipidemia    Left anterior fascicular block (LAFB) 01/10/2013   noted on EKG   Left kidney mass 10/12/2018   Hyperechoic mass mid left kidney measuring 1.0 x 1.0 x 1.2 cm, noted on [place] AB   Nephrolithiasis 10/12/2018   Right, Noted on MRI Abd   Pelvic pain  Chronic, right lower quadrant   Wears contact lenses    Wears glasses     Past Surgical History:  Procedure Laterality Date   ABDOMINAL HYSTERECTOMY     partial   CHOLECYSTECTOMY N/A 11/18/2020   Procedure: LAPAROSCOPIC CHOLECYSTECTOMY;  Surgeon: Abigail Miyamoto, MD;  Location: [facility];  Service: General;  Laterality: N/A;   ENDOMETRIAL ABLATION     Pelvic   HOLMIUM LASER APPLICATION Right 11/15/2018   Procedure: HOLMIUM LASER APPLICATION;  Surgeon: Jerilee Field, MD;  Location: [facility];  Service: Urology;  Laterality: Right;   URETEROSCOPY WITH HOLMIUM LASER LITHOTRIPSY Right 11/15/2018   Procedure: CYSTOSCOPY, RETROGRADE, URETEROSCOPY WITH HOLMIUM LASER LITHOTRIPSY/STENT PLACEMENT;  Surgeon: Jerilee Field, MD;  Location: [facility];   Service: Urology;  Laterality: Right;   VAGINAL HYSTERECTOMY  2007   Partial   WISDOM TOOTH EXTRACTION      Family History  Problem Relation Age of Onset   Diabetes Mother        Doyce Para (recent diagnosis) causes thrombocytosis   Heart attack Mother 17   Heart disease Mother    Seizures Mother    Dementia Mother    Hypertension Father    COPD Father    Bipolar disorder Father    Parkinson's disease Father    Cervical cancer Sister    Alcohol abuse Brother    COPD Brother    Hyperlipidemia Brother    Hypertension Brother    Stroke Brother    Heart attack Brother    COPD Maternal Grandmother    Hyperlipidemia Maternal Grandmother    Heart attack Maternal Grandmother    Depression Paternal Grandmother    Heart disease Paternal Grandmother    Heart attack Paternal Grandmother    Colon cancer Neg Hx    Esophageal cancer Neg Hx    Stomach cancer Neg Hx    Rectal cancer Neg Hx     Social History   Socioeconomic History   Marital status: Legally Separated    Spouse name: Not on file   Number of children: Not on file   Years of education: Not on file   Highest education level: Not on file  Occupational History   Occupation: Nurse    Employer: [employer]  Tobacco Use   Smoking status: Never   Smokeless tobacco: Never  Vaping Use   Vaping Use: Never used  Substance and Sexual Activity   Alcohol use: Yes    Comment: occ   Drug use: No   Sexual activity: Not Currently    Birth control/protection: Surgical  Other Topics Concern   Not on file  Social History Narrative   Works for Heart failure team at [employer]   2 daughters   Separated   Enjoys reading/working out, spending time outdoors.     Social Determinants of Health   Financial Resource Strain: Low Risk  (04/02/2018)   Overall Financial Resource Strain (CARDIA)    Difficulty of Paying Living Expenses: Not hard at all  Food Insecurity: No Food Insecurity (04/02/2018)   Hunger Vital Sign    Worried  About Running Out of Food in the Last Year: Never true    Ran Out of Food in the Last Year: Never true  Transportation Needs: Not on file  Physical Activity: Sufficiently Active (04/02/2018)   Exercise Vital Sign    Days of Exercise per Week: 3 days    Minutes of Exercise per Session: 60 min  Stress: Stress Concern Present (  04/02/2018)   [employer] [employer] - Occupational Stress Questionnaire    Feeling of Stress : Rather much  Social Connections: Moderately Integrated (04/02/2018)   Social Connection and Isolation Panel [NHANES]    Frequency of Communication with Friends and Family: More than three times a week    Frequency of Social Gatherings with Friends and Family: Three times a week    Attends Religious Services: More than 4 times per year    Active Member of Clubs or Organizations: Yes    Attends Banker Meetings: More than 4 times per year    Marital Status: Separated  Intimate Partner Violence: Not on file    Outpatient Medications Prior to Visit  Medication Sig Dispense Refill   indapamide (LOZOL) 1.25 MG tablet Take 1 tablet (1.25 mg total) by mouth every morning. 30 tablet 3   nitroGLYCERIN (NITROSTAT) 0.4 MG SL tablet Take 1 tablet, under your tongue, while sitting.  If no relief of pain may repeat NTG, one tab every 5 minutes up to 3 tablets total over 15 minutes. 25 tablet 3   Semaglutide-Weight Management 2.4 MG/0.75ML SOAJ Inject 2.4 mg into the skin once a week. 3 mL 11   atorvastatin (LIPITOR) 20 MG tablet Take 1 tablet (20 mg total) by mouth daily. 90 tablet 1   fenofibrate (TRICOR) 145 MG tablet Take 1 tablet (145 mg total) by mouth daily. 90 tablet 1   No facility-administered medications prior to visit.    Allergies  Allergen Reactions   Cephalexin Nausea Only and Rash    ROS    See HPI Objective:    Physical Exam Constitutional:      General: She is not in acute distress.    Appearance: Normal appearance. She is not  ill-appearing.  HENT:     Head: Normocephalic and atraumatic.     Right Ear: External ear normal.     Left Ear: External ear normal.  Eyes:     Extraocular Movements: Extraocular movements intact.     Pupils: Pupils are equal, round, and reactive to light.  Cardiovascular:     Rate and Rhythm: Normal rate and regular rhythm.     Heart sounds: Normal heart sounds. No murmur heard.    No gallop.  Pulmonary:     Effort: Pulmonary effort is normal. No respiratory distress.     Breath sounds: Normal breath sounds. No wheezing or rales.  Skin:    General: Skin is warm and dry.  Neurological:     Mental Status: She is alert and oriented to person, place, and time.  Psychiatric:        Judgment: Judgment normal.     BP 118/83 (BP Location: Right Arm, Patient Position: Sitting, Cuff Size: Small)   Pulse 87   Temp 98.3 F (36.8 C) (Oral)   Resp 16   Wt 183 lb (83 kg)   SpO2 99%   BMI 33.47 kg/m  Wt Readings from Last 3 Encounters:  04/03/22 183 lb (83 kg)  03/07/22 179 lb (81.2 kg)  02/28/22 182 lb (82.6 kg)       Assessment & Plan:   Problem List Items Addressed This Visit       Unprioritized   Obesity (BMI 30.0-34.9)    Wt Readings from Last 3 Encounters:  04/03/22 183 lb (83 kg)  03/07/22 179 lb (81.2 kg)  02/28/22 182 lb (82.6 kg)  Reports max weight was 218 so she has come down quite a  bit. Continue Wegovy, diet/exercise.       Hypertriglyceridemia    Lab Results  Component Value Date   CHOL 113 12/30/2021   HDL 31.50 (L) 12/30/2021   LDLCALC 44 12/30/2021   LDLDIRECT 87.0 03/30/2021   TRIG 187.0 (H) 12/30/2021   CHOLHDL 4 12/30/2021  Maintained on lipitor and fenofibrate. She is non-fasting today. She plans to complete fasting lipid panel at her follow up appointment next month with cardiology.       Relevant Medications   atorvastatin (LIPITOR) 20 MG tablet   fenofibrate (TRICOR) 145 MG tablet   Hypertension    BP Readings from Last 3 Encounters:   04/03/22 118/83  03/07/22 117/77  12/30/21 115/68  She is not currently on bp medication. BP is at goal following her weight loss.       Relevant Medications   atorvastatin (LIPITOR) 20 MG tablet   fenofibrate (TRICOR) 145 MG tablet   Controlled type 2 diabetes mellitus without complication, without long-term current use of insulin (HCC)    Lab Results  Component Value Date   HGBA1C 6.0 12/30/2021   HGBA1C 6.7 (H) 09/12/2021   Lab Results  Component Value Date   MICROALBUR 1.3 01/02/2022   LDLCALC 44 12/30/2021   CREATININE 1.20 12/30/2021  Last A1C was at goal. She is following with cardiology for her Ozempic and plans to have A1C rechecked at her upcoming appointment.        Relevant Medications   atorvastatin (LIPITOR) 20 MG tablet     Meds ordered this encounter  Medications   atorvastatin (LIPITOR) 20 MG tablet    Sig: Take 1 tablet (20 mg total) by mouth daily.    Dispense:  90 tablet    Refill:  1    Order Specific Question:   Supervising Provider    Answer:   Danise Edge A [[id number]]   fenofibrate (TRICOR) 145 MG tablet    Sig: Take 1 tablet (145 mg total) by mouth daily.    Dispense:  90 tablet    Refill:  1    Please call to schedule appointment for future refills.    Order Specific Question:   Supervising Provider    Answer:   Bradd Canary [[id number]]    I, Lemont Fillers, NP, personally preformed the services described in this documentation.  All medical record entries made by the scribe were at my direction and in my presence.  I have reviewed the chart and discharge instructions (if applicable) and agree that the record reflects my personal performance and is accurate and complete. 04/03/2022   I,Shehryar Baig,acting as a Neurosurgeon for Lemont Fillers, NP.,have documented all relevant documentation on the behalf of Lemont Fillers, NP,as directed by  Lemont Fillers, NP while in the presence of Lemont Fillers, NP.   Lemont Fillers, NP

## 2022-04-13 ENCOUNTER — Other Ambulatory Visit (HOSPITAL_COMMUNITY): Payer: Self-pay

## 2022-04-27 ENCOUNTER — Other Ambulatory Visit: Payer: Self-pay

## 2022-05-14 ENCOUNTER — Other Ambulatory Visit (HOSPITAL_COMMUNITY): Payer: Self-pay

## 2022-05-15 ENCOUNTER — Other Ambulatory Visit (HOSPITAL_COMMUNITY): Payer: Self-pay

## 2022-05-17 ENCOUNTER — Other Ambulatory Visit (HOSPITAL_COMMUNITY): Payer: Self-pay

## 2022-05-17 MED ORDER — INDAPAMIDE 1.25 MG PO TABS
1.2500 mg | ORAL_TABLET | Freq: Every morning | ORAL | 3 refills | Status: DC
  Filled 2022-05-17: qty 30, 30d supply, fill #0
  Filled 2022-06-22: qty 30, 30d supply, fill #1
  Filled 2022-07-25: qty 30, 30d supply, fill #2
  Filled 2022-08-27: qty 30, 30d supply, fill #3

## 2022-05-23 ENCOUNTER — Encounter: Payer: Self-pay | Admitting: Family Medicine

## 2022-05-23 ENCOUNTER — Ambulatory Visit (INDEPENDENT_AMBULATORY_CARE_PROVIDER_SITE_OTHER): Payer: 59 | Admitting: Family Medicine

## 2022-05-23 VITALS — BP 105/71 | HR 74 | Temp 98.3°F | Resp 16 | Wt 183.0 lb

## 2022-05-23 DIAGNOSIS — H10022 Other mucopurulent conjunctivitis, left eye: Secondary | ICD-10-CM | POA: Insufficient documentation

## 2022-05-23 MED ORDER — MOXIFLOXACIN HCL 0.5 % OP SOLN: 1.0000 [drp] | Freq: Three times a day (TID) | OPHTHALMIC | 0 refills | Status: DC

## 2022-05-23 NOTE — Assessment & Plan Note (Signed)
vigamox gtts Wash linens, towels etc If no better 2-3 days==== opth

## 2022-05-23 NOTE — Progress Notes (Signed)
Subjective:   By signing my name below, I, Shehryar Baig, attest that this documentation has been prepared under the direction and in the presence of Dr. Seabron Spates, DO. 05/23/2022    Patient ID: Jane Montes, female    DOB: 1971/04/21, 51 y.o.   MRN: [id number]  Chief Complaint  Patient presents with   Eye Pain    Complains of left eye pain and redness with drainage     Eye Pain  Associated symptoms include an eye discharge (light green in left eye) and photophobia. Pertinent negatives include no blurred vision, fever or nausea.   Patient is in today for a office visit.   She complains of itchiness in her left eye since yesterday. She also has light sensitivity and light green discharge as well. She is applying OTC allergy eye drops to manage her symptoms. She is washing her bed sheets and threw out her mascara. She tested negative for Covid-19 at home.    Past Medical History:  Diagnosis Date   Adenoma of right adrenal gland 10/12/2018   Noted on MRI Abd, measuring 2.2 x 2.4 x 2.7 cm   Anxiety    Aortic atherosclerosis (HCC) 10/10/2018   Noted on [place]   Diabetes mellitus without complication (HCC)    Fatty liver 10/12/2018   Noted on MRI Abd   Fatty liver 09/29/2020   GERD (gastroesophageal reflux disease) 09/16/2015   Mod. noted on KUB   Heart palpitations    High cholesterol    History of chest pain    History of dizziness    History of hiatal hernia 09/16/2015   Small, noted on KUB   History of kidney stones    History of migraine    Hydronephrosis of right kidney 10/12/2018   Mild, noted on MRI Abd   Hyperlipidemia    Left anterior fascicular block (LAFB) 01/10/2013   noted on EKG   Left kidney mass 10/12/2018   Hyperechoic mass mid left kidney measuring 1.0 x 1.0 x 1.2 cm, noted on [place] AB   Nephrolithiasis 10/12/2018   Right, Noted on MRI Abd   Pelvic pain    Chronic, right lower quadrant   Wears contact lenses    Wears glasses     Past  Surgical History:  Procedure Laterality Date   ABDOMINAL HYSTERECTOMY     partial   CHOLECYSTECTOMY N/A 11/18/2020   Procedure: LAPAROSCOPIC CHOLECYSTECTOMY;  Surgeon: Abigail Miyamoto, MD;  Location: [facility];  Service: General;  Laterality: N/A;   ENDOMETRIAL ABLATION     Pelvic   HOLMIUM LASER APPLICATION Right 11/15/2018   Procedure: HOLMIUM LASER APPLICATION;  Surgeon: Jerilee Field, MD;  Location: [facility];  Service: Urology;  Laterality: Right;   URETEROSCOPY WITH HOLMIUM LASER LITHOTRIPSY Right 11/15/2018   Procedure: CYSTOSCOPY, RETROGRADE, URETEROSCOPY WITH HOLMIUM LASER LITHOTRIPSY/STENT PLACEMENT;  Surgeon: Jerilee Field, MD;  Location: [facility];  Service: Urology;  Laterality: Right;   VAGINAL HYSTERECTOMY  2007   Partial   WISDOM TOOTH EXTRACTION      Family History  Problem Relation Age of Onset   Diabetes Mother        Doyce Para (recent diagnosis) causes thrombocytosis   Heart attack Mother 36   Heart disease Mother    Seizures Mother    Dementia Mother    Hypertension Father    COPD Father    Bipolar disorder Father    Parkinson's disease Father    Cervical cancer Sister  Alcohol abuse Brother    COPD Brother    Hyperlipidemia Brother    Hypertension Brother    Stroke Brother    Heart attack Brother    COPD Maternal Grandmother    Hyperlipidemia Maternal Grandmother    Heart attack Maternal Grandmother    Depression Paternal Grandmother    Heart disease Paternal Grandmother    Heart attack Paternal Grandmother    Colon cancer Neg Hx    Esophageal cancer Neg Hx    Stomach cancer Neg Hx    Rectal cancer Neg Hx     Social History   Socioeconomic History   Marital status: Legally Separated    Spouse name: Not on file   Number of children: Not on file   Years of education: Not on file   Highest education level: Not on file  Occupational History   Occupation: Nurse    Employer: [employer]  Tobacco Use   Smoking status: Never   Smokeless tobacco: Never  Vaping Use   Vaping Use: Never used  Substance and Sexual Activity   Alcohol use: Yes    Comment: occ   Drug use: No   Sexual activity: Not Currently    Birth control/protection: Surgical  Other Topics Concern   Not on file  Social History Narrative   Works for Heart failure team at [employer]   2 daughters   Separated   Enjoys reading/working out, spending time outdoors.     Social Determinants of Health   Financial Resource Strain: Low Risk  (04/02/2018)   Overall Financial Resource Strain (CARDIA)    Difficulty of Paying Living Expenses: Not hard at all  Food Insecurity: No Food Insecurity (04/02/2018)   Hunger Vital Sign    Worried About Running Out of Food in the Last Year: Never true    Ran Out of Food in the Last Year: Never true  Transportation Needs: Not on file  Physical Activity: Sufficiently Active (04/02/2018)   Exercise Vital Sign    Days of Exercise per Week: 3 days    Minutes of Exercise per Session: 60 min  Stress: Stress Concern Present (04/02/2018)   [place]    Feeling of Stress : Rather much  Social Connections: Moderately Integrated (04/02/2018)   Social Connection and Isolation Panel [NHANES]    Frequency of Communication with Friends and Family: More than three times a week    Frequency of Social Gatherings with Friends and Family: Three times a week    Attends Religious Services: More than 4 times per year    Active Member of Clubs or Organizations: Yes    Attends Archivist Meetings: More than 4 times per year    Marital Status: Separated  Intimate Partner Violence: Not on file    Outpatient Medications Prior to Visit  Medication Sig Dispense Refill   atorvastatin (LIPITOR) 20 MG tablet Take 1 tablet (20 mg total) by mouth daily. 90 tablet 1   fenofibrate (TRICOR) 145 MG tablet Take 1 tablet (145 mg total)  by mouth daily. 90 tablet 1   indapamide (LOZOL) 1.25 MG tablet Take 1 tablet (1.25 mg total) by mouth every morning. 30 tablet 3   nitroGLYCERIN (NITROSTAT) 0.4 MG SL tablet Take 1 tablet, under your tongue, while sitting.  If no relief of pain may repeat NTG, one tab every 5 minutes up to 3 tablets total over 15 minutes. 25 tablet 3   Semaglutide-Weight Management 2.4  MG/0.75ML SOAJ Inject 2.4 mg into the skin once a week. 3 mL 11   No facility-administered medications prior to visit.    Allergies  Allergen Reactions   Cephalexin Nausea Only and Rash    Review of Systems  Constitutional:  Negative for fever and malaise/fatigue.  HENT:  Negative for congestion.   Eyes:  Positive for photophobia, pain (left eye) and discharge (light green in left eye). Negative for blurred vision.       (+)itching in left eye  Respiratory:  Negative for shortness of breath.   Cardiovascular:  Negative for chest pain, palpitations and leg swelling.  Gastrointestinal:  Negative for abdominal pain, blood in stool and nausea.  Genitourinary:  Negative for dysuria and frequency.  Musculoskeletal:  Negative for falls.  Skin:  Negative for rash.  Neurological:  Negative for dizziness, loss of consciousness and headaches.  Endo/Heme/Allergies:  Negative for environmental allergies.  Psychiatric/Behavioral:  Negative for depression. The patient is not nervous/anxious.        Objective:    Physical Exam Vitals and nursing note reviewed.  Constitutional:      General: She is not in acute distress.    Appearance: Normal appearance. She is not ill-appearing.  HENT:     Head: Normocephalic and atraumatic.     Right Ear: External ear normal.     Left Ear: External ear normal.  Eyes:     General: Lids are normal.        Right eye: No foreign body, discharge or hordeolum.        Left eye: Discharge present.No foreign body or hordeolum.     Extraocular Movements: Extraocular movements intact.      Conjunctiva/sclera:     Right eye: Right conjunctiva is not injected. No chemosis, exudate or hemorrhage.    Left eye: Left conjunctiva is injected. Exudate present.     Pupils: Pupils are equal, round, and reactive to light.  Cardiovascular:     Rate and Rhythm: Normal rate and regular rhythm.     Heart sounds: Normal heart sounds. No murmur heard.    No gallop.  Pulmonary:     Effort: Pulmonary effort is normal. No respiratory distress.     Breath sounds: Normal breath sounds. No wheezing or rales.  Skin:    General: Skin is warm and dry.  Neurological:     Mental Status: She is alert and oriented to person, place, and time.  Psychiatric:        Judgment: Judgment normal.     BP 105/71 (BP Location: Right Arm, Patient Position: Sitting, Cuff Size: Large)   Pulse 74   Temp 98.3 F (36.8 C) (Oral)   Resp 16   Wt 183 lb (83 kg)   SpO2 100%   BMI 33.47 kg/m  Wt Readings from Last 3 Encounters:  05/23/22 183 lb (83 kg)  04/03/22 183 lb (83 kg)  03/07/22 179 lb (81.2 kg)    Diabetic Foot Exam - Simple   No data filed    Lab Results  Component Value Date   WBC 8.5 09/21/2020   HGB 13.1 09/21/2020   HCT 37.8 09/21/2020   PLT 391.0 09/21/2020   GLUCOSE 100 (H) 12/30/2021   CHOL 113 12/30/2021   TRIG 187.0 (H) 12/30/2021   HDL 31.50 (L) 12/30/2021   LDLDIRECT 87.0 03/30/2021   LDLCALC 44 12/30/2021   ALT 23 12/30/2021   AST 21 12/30/2021   NA 140 12/30/2021   K 3.3 (L)  12/30/2021   CL 102 12/30/2021   CREATININE 1.20 12/30/2021   BUN 23 12/30/2021   CO2 31 12/30/2021   TSH 1.61 12/03/2019   INR 0.92 09/27/2009   HGBA1C 6.0 12/30/2021   MICROALBUR 1.3 01/02/2022    Lab Results  Component Value Date   TSH 1.61 12/03/2019   Lab Results  Component Value Date   WBC 8.5 09/21/2020   HGB 13.1 09/21/2020   HCT 37.8 09/21/2020   MCV 87.2 09/21/2020   PLT 391.0 09/21/2020   Lab Results  Component Value Date   NA 140 12/30/2021   K 3.3 (L) 12/30/2021    CO2 31 12/30/2021   GLUCOSE 100 (H) 12/30/2021   BUN 23 12/30/2021   CREATININE 1.20 12/30/2021   BILITOT 0.4 12/30/2021   ALKPHOS 49 12/30/2021   AST 21 12/30/2021   ALT 23 12/30/2021   PROT 6.3 12/30/2021   ALBUMIN 4.1 12/30/2021   CALCIUM 9.9 12/30/2021   ANIONGAP 12 11/10/2020   EGFR 54 (L) 09/12/2021   GFR 52.72 (L) 12/30/2021   Lab Results  Component Value Date   CHOL 113 12/30/2021   Lab Results  Component Value Date   HDL 31.50 (L) 12/30/2021   Lab Results  Component Value Date   LDLCALC 44 12/30/2021   Lab Results  Component Value Date   TRIG 187.0 (H) 12/30/2021   Lab Results  Component Value Date   CHOLHDL 4 12/30/2021   Lab Results  Component Value Date   HGBA1C 6.0 12/30/2021       Assessment & Plan:   Problem List Items Addressed This Visit       Unprioritized   Pink eye disease of left eye - Primary    vigamox gtts Wash linens, towels etc If no better 2-3 days==== opth       Relevant Medications   moxifloxacin (VIGAMOX) 0.5 % ophthalmic solution     Meds ordered this encounter  Medications   moxifloxacin (VIGAMOX) 0.5 % ophthalmic solution    Sig: Place 1 drop into the left eye 3 (three) times daily.    Dispense:  3 mL    Refill:  0    I, Ann Held, DO, personally preformed the services described in this documentation.  All medical record entries made by the scribe were at my direction and in my presence.  I have reviewed the chart and discharge instructions (if applicable) and agree that the record reflects my personal performance and is accurate and complete. 05/23/2022   I,Shehryar Baig,acting as a scribe for Ann Held, DO.,have documented all relevant documentation on the behalf of Ann Held, DO,as directed by  Ann Held, DO while in the presence of Ann Held, DO.   Ann Held, DO

## 2022-05-23 NOTE — Patient Instructions (Signed)
Bacterial Conjunctivitis, Adult ?Bacterial conjunctivitis is an infection of the clear membrane that covers the white part of the eye and the inner surface of the eyelid (conjunctiva). When the blood vessels in the conjunctiva become inflamed, the eye becomes red or pink. The eye often feels irritated or itchy. Bacterial conjunctivitis spreads easily from person to person (is contagious). It also spreads easily from one eye to the other eye. ?What are the causes? ?This condition is caused by bacteria. You may get the infection if you come into close contact with: ?A person who is infected with the bacteria. ?Items that are contaminated with the bacteria, such as a face towel, contact lens solution, or eye makeup. ?What increases the risk? ?You are more likely to develop this condition if: ?You are exposed to other people who have the infection. ?You wear contact lenses. ?You have a sinus infection. ?You have had a recent eye injury or surgery. ?You have a weak body defense system (immune system). ?You have a medical condition that causes dry eyes. ?What are the signs or symptoms? ?Symptoms of this condition include: ?Thick, yellowish discharge from the eye. This may turn into a crust on the eyelid overnight and cause your eyelids to stick together. ?Tearing or watery eyes. ?Itchy eyes. ?Burning feeling in your eyes. ?Eye redness. ?Swollen eyelids. ?Blurred vision. ?How is this diagnosed? ?This condition is diagnosed based on your symptoms and medical history. Your health care provider may also take a sample of discharge from your eye to find the cause of your infection. ?How is this treated? ?This condition may be treated with: ?Antibiotic eye drops or ointment to clear the infection more quickly and prevent the spread of infection to others. ?Antibiotic medicines taken by mouth (orally) to treat infections that do not respond to drops or ointments or that last longer than 10 days. ?Cool, wet cloths (cool  compresses) placed on the eyes. ?Artificial tears applied 2-6 times a day. ?Follow these instructions at home: ?Medicines ?Take or apply your antibiotic medicine as told by your health care provider. Do not stop using the antibiotic, even if your condition improves, unless directed by your health care provider. ?Take or apply over-the-counter and prescription medicines only as told by your health care provider. ?Be very careful to avoid touching the edge of your eyelid with the eye-drop bottle or the ointment tube when you apply medicines to the affected eye. This will keep you from spreading the infection to your other eye or to other people. ?Managing discomfort ?Gently wipe away any drainage from your eye with a warm, wet washcloth or a cotton ball. ?Apply a clean, cool compress to your eye for 10-20 minutes, 3-4 times a day. ?General instructions ?Do not wear contact lenses until the inflammation is gone and your health care provider says it is safe to wear them again. Ask your health care provider how to sterilize or replace your contact lenses before you use them again. Wear glasses until you can resume wearing contact lenses. ?Avoid wearing eye makeup until the inflammation is gone. Throw away any old eye cosmetics that may be contaminated. ?Change or wash your pillowcase every day. ?Do not share towels or washcloths. This may spread the infection. ?Wash your hands often with soap and water for at least 20 seconds and especially before touching your face or eyes. Use paper towels to dry your hands. ?Avoid touching or rubbing your eyes. ?Do not drive or use heavy machinery if your vision is blurred. ?Contact   a health care provider if: ?You have a fever. ?Your symptoms do not get better after 10 days. ?Get help right away if: ?You have a fever and your symptoms suddenly get worse. ?You have severe pain when you move your eye. ?You have facial pain, redness, or swelling. ?You have a sudden loss of  vision. ?Summary ?Bacterial conjunctivitis is an infection of the clear membrane that covers the white part of the eye and the inner surface of the eyelid (conjunctiva). ?Bacterial conjunctivitis spreads easily from eye to eye and from person to person (is contagious). ?Wash your hands often with soap and water for at least 20 seconds and especially before touching your face or eyes. Use paper towels to dry your hands. ?Take or apply your antibiotic medicine as told by your health care provider. Do not stop using the antibiotic even if your condition improves. ?Contact a health care provider if you have a fever or if your symptoms do not get better after 10 days. Get help right away if you have a sudden loss of vision. ?This information is not intended to replace advice given to you by your health care provider. Make sure you discuss any questions you have with your health care provider. ?Document Revised: 01/05/2021 Document Reviewed: 01/05/2021 ?Elsevier Patient Education ? 2023 Elsevier Inc. ? ?

## 2022-06-09 ENCOUNTER — Other Ambulatory Visit (HOSPITAL_COMMUNITY): Payer: Self-pay

## 2022-06-13 ENCOUNTER — Other Ambulatory Visit (HOSPITAL_COMMUNITY): Payer: Self-pay

## 2022-06-22 ENCOUNTER — Other Ambulatory Visit (HOSPITAL_COMMUNITY): Payer: Self-pay

## 2022-06-23 ENCOUNTER — Other Ambulatory Visit (HOSPITAL_COMMUNITY): Payer: Self-pay

## 2022-07-06 ENCOUNTER — Other Ambulatory Visit (HOSPITAL_COMMUNITY): Payer: Self-pay

## 2022-07-11 ENCOUNTER — Other Ambulatory Visit (HOSPITAL_COMMUNITY): Payer: Self-pay

## 2022-07-11 ENCOUNTER — Ambulatory Visit: Payer: 59

## 2022-07-25 ENCOUNTER — Other Ambulatory Visit (HOSPITAL_COMMUNITY): Payer: Self-pay

## 2022-08-03 ENCOUNTER — Other Ambulatory Visit (HOSPITAL_COMMUNITY): Payer: Self-pay

## 2022-08-28 ENCOUNTER — Other Ambulatory Visit (HOSPITAL_COMMUNITY): Payer: Self-pay

## 2022-08-29 ENCOUNTER — Other Ambulatory Visit (HOSPITAL_COMMUNITY): Payer: Self-pay

## 2022-09-25 ENCOUNTER — Other Ambulatory Visit (HOSPITAL_COMMUNITY): Payer: Self-pay

## 2022-09-25 MED ORDER — INDAPAMIDE 1.25 MG PO TABS
1.2500 mg | ORAL_TABLET | Freq: Every morning | ORAL | 3 refills | Status: DC
  Filled 2022-09-25: qty 30, 30d supply, fill #0
  Filled 2022-10-25 – 2022-10-26 (×2): qty 30, 30d supply, fill #1
  Filled 2022-12-06: qty 30, 30d supply, fill #2
  Filled 2023-01-02: qty 30, 30d supply, fill #3

## 2022-09-26 ENCOUNTER — Other Ambulatory Visit: Payer: Self-pay

## 2022-09-28 ENCOUNTER — Other Ambulatory Visit (HOSPITAL_COMMUNITY): Payer: Self-pay

## 2022-10-25 ENCOUNTER — Other Ambulatory Visit: Payer: Self-pay | Admitting: Family

## 2022-10-25 ENCOUNTER — Other Ambulatory Visit (HOSPITAL_COMMUNITY): Payer: Self-pay

## 2022-10-25 ENCOUNTER — Other Ambulatory Visit: Payer: Self-pay | Admitting: Cardiovascular Disease

## 2022-10-25 MED ORDER — ATORVASTATIN CALCIUM 20 MG PO TABS
20.0000 mg | ORAL_TABLET | Freq: Every day | ORAL | 0 refills | Status: DC
  Filled 2022-10-25: qty 30, 30d supply, fill #0

## 2022-10-25 MED ORDER — FENOFIBRATE 145 MG PO TABS
145.0000 mg | ORAL_TABLET | Freq: Every day | ORAL | 0 refills | Status: DC
  Filled 2022-10-25: qty 30, 30d supply, fill #0

## 2022-10-25 MED ORDER — WEGOVY 2.4 MG/0.75ML ~~LOC~~ SOAJ
2.4000 mg | SUBCUTANEOUS | 11 refills | Status: DC
  Filled 2022-10-25: qty 3, 28d supply, fill #0
  Filled 2022-11-21: qty 3, 28d supply, fill #1
  Filled 2022-12-18: qty 3, 28d supply, fill #2

## 2022-10-26 ENCOUNTER — Other Ambulatory Visit: Payer: Self-pay

## 2022-11-23 ENCOUNTER — Emergency Department (HOSPITAL_BASED_OUTPATIENT_CLINIC_OR_DEPARTMENT_OTHER): Payer: 59

## 2022-11-23 ENCOUNTER — Encounter (HOSPITAL_BASED_OUTPATIENT_CLINIC_OR_DEPARTMENT_OTHER): Payer: Self-pay | Admitting: Urology

## 2022-11-23 ENCOUNTER — Telehealth: Payer: Self-pay | Admitting: Family

## 2022-11-23 ENCOUNTER — Inpatient Hospital Stay (HOSPITAL_BASED_OUTPATIENT_CLINIC_OR_DEPARTMENT_OTHER)
Admission: EM | Admit: 2022-11-23 | Discharge: 2022-11-26 | DRG: 872 | Disposition: A | Discharge status: Home / Self Care | Payer: 59 | Attending: Family Medicine | Admitting: Family Medicine

## 2022-11-23 ENCOUNTER — Other Ambulatory Visit: Payer: Self-pay

## 2022-11-23 DIAGNOSIS — I700 Atherosclerosis of aorta: Secondary | ICD-10-CM | POA: Diagnosis not present

## 2022-11-23 DIAGNOSIS — F419 Anxiety disorder, unspecified: Secondary | ICD-10-CM | POA: Diagnosis not present

## 2022-11-23 DIAGNOSIS — Z83438 Family history of other disorder of lipoprotein metabolism and other lipidemia: Secondary | ICD-10-CM | POA: Diagnosis not present

## 2022-11-23 DIAGNOSIS — E876 Hypokalemia: Secondary | ICD-10-CM | POA: Diagnosis present

## 2022-11-23 DIAGNOSIS — R55 Syncope and collapse: Secondary | ICD-10-CM | POA: Insufficient documentation

## 2022-11-23 DIAGNOSIS — Z6833 Body mass index (BMI) 33.0-33.9, adult: Secondary | ICD-10-CM

## 2022-11-23 DIAGNOSIS — K219 Gastro-esophageal reflux disease without esophagitis: Secondary | ICD-10-CM | POA: Diagnosis not present

## 2022-11-23 DIAGNOSIS — E1165 Type 2 diabetes mellitus with hyperglycemia: Secondary | ICD-10-CM | POA: Diagnosis present

## 2022-11-23 DIAGNOSIS — N1831 Chronic kidney disease, stage 3a: Secondary | ICD-10-CM | POA: Diagnosis not present

## 2022-11-23 DIAGNOSIS — I129 Hypertensive chronic kidney disease with stage 1 through stage 4 chronic kidney disease, or unspecified chronic kidney disease: Secondary | ICD-10-CM | POA: Diagnosis present

## 2022-11-23 DIAGNOSIS — Z825 Family history of asthma and other chronic lower respiratory diseases: Secondary | ICD-10-CM | POA: Diagnosis not present

## 2022-11-23 DIAGNOSIS — Z1152 Encounter for screening for COVID-19: Secondary | ICD-10-CM | POA: Diagnosis not present

## 2022-11-23 DIAGNOSIS — E871 Hypo-osmolality and hyponatremia: Secondary | ICD-10-CM | POA: Diagnosis present

## 2022-11-23 DIAGNOSIS — Z8249 Family history of ischemic heart disease and other diseases of the circulatory system: Secondary | ICD-10-CM | POA: Diagnosis not present

## 2022-11-23 DIAGNOSIS — N12 Tubulo-interstitial nephritis, not specified as acute or chronic: Principal | ICD-10-CM | POA: Diagnosis present

## 2022-11-23 DIAGNOSIS — E7800 Pure hypercholesterolemia, unspecified: Secondary | ICD-10-CM | POA: Diagnosis not present

## 2022-11-23 DIAGNOSIS — J984 Other disorders of lung: Secondary | ICD-10-CM | POA: Diagnosis not present

## 2022-11-23 DIAGNOSIS — Z9049 Acquired absence of other specified parts of digestive tract: Secondary | ICD-10-CM | POA: Diagnosis not present

## 2022-11-23 DIAGNOSIS — K760 Fatty (change of) liver, not elsewhere classified: Secondary | ICD-10-CM | POA: Diagnosis not present

## 2022-11-23 DIAGNOSIS — A419 Sepsis, unspecified organism: Secondary | ICD-10-CM | POA: Diagnosis not present

## 2022-11-23 DIAGNOSIS — E861 Hypovolemia: Secondary | ICD-10-CM | POA: Diagnosis not present

## 2022-11-23 DIAGNOSIS — Z833 Family history of diabetes mellitus: Secondary | ICD-10-CM

## 2022-11-23 DIAGNOSIS — Z820 Family history of epilepsy and other diseases of the nervous system: Secondary | ICD-10-CM | POA: Diagnosis not present

## 2022-11-23 DIAGNOSIS — Z888 Allergy status to other drugs, medicaments and biological substances status: Secondary | ICD-10-CM

## 2022-11-23 DIAGNOSIS — E1122 Type 2 diabetes mellitus with diabetic chronic kidney disease: Secondary | ICD-10-CM | POA: Diagnosis present

## 2022-11-23 DIAGNOSIS — I10 Essential (primary) hypertension: Secondary | ICD-10-CM | POA: Diagnosis present

## 2022-11-23 DIAGNOSIS — E119 Type 2 diabetes mellitus without complications: Secondary | ICD-10-CM

## 2022-11-23 DIAGNOSIS — Z79899 Other long term (current) drug therapy: Secondary | ICD-10-CM

## 2022-11-23 DIAGNOSIS — D649 Anemia, unspecified: Secondary | ICD-10-CM | POA: Diagnosis not present

## 2022-11-23 DIAGNOSIS — E669 Obesity, unspecified: Secondary | ICD-10-CM | POA: Diagnosis present

## 2022-11-23 DIAGNOSIS — D3501 Benign neoplasm of right adrenal gland: Secondary | ICD-10-CM | POA: Diagnosis not present

## 2022-11-23 DIAGNOSIS — E663 Overweight: Secondary | ICD-10-CM | POA: Diagnosis present

## 2022-11-23 DIAGNOSIS — Z823 Family history of stroke: Secondary | ICD-10-CM

## 2022-11-23 DIAGNOSIS — N10 Acute tubulo-interstitial nephritis: Secondary | ICD-10-CM | POA: Diagnosis not present

## 2022-11-23 HISTORY — DX: Tubulo-interstitial nephritis, not specified as acute or chronic: N12

## 2022-11-23 LAB — BASIC METABOLIC PANEL
Anion gap: 8 (ref 5–15)
BUN: 24 mg/dL — ABNORMAL HIGH (ref 6–20)
CO2: 20 mmol/L — ABNORMAL LOW (ref 22–32)
Calcium: 8.7 mg/dL — ABNORMAL LOW (ref 8.9–10.3)
Chloride: 102 mmol/L (ref 98–111)
Creatinine, Ser: 1.24 mg/dL — ABNORMAL HIGH (ref 0.44–1.00)
GFR, Estimated: 53 mL/min — ABNORMAL LOW (ref >60)
Glucose, Bld: 181 mg/dL — ABNORMAL HIGH (ref 70–99)
Potassium: 3.1 mmol/L — ABNORMAL LOW (ref 3.5–5.1)
Sodium: 130 mmol/L — ABNORMAL LOW (ref 135–145)

## 2022-11-23 LAB — RESP PANEL BY RT-PCR (RSV, FLU A&B, COVID)  RVPGX2
Influenza A by PCR: NEGATIVE
Influenza B by PCR: NEGATIVE
Resp Syncytial Virus by PCR: NEGATIVE
SARS Coronavirus 2 by RT PCR: NEGATIVE

## 2022-11-23 LAB — URINALYSIS, MICROSCOPIC (REFLEX)

## 2022-11-23 LAB — CBC
HCT: 33.7 % — ABNORMAL LOW (ref 36.0–46.0)
Hemoglobin: 11.5 g/dL — ABNORMAL LOW (ref 12.0–15.0)
MCH: 29.3 pg (ref 26.0–34.0)
MCHC: 34.1 g/dL (ref 30.0–36.0)
MCV: 86 fL (ref 80.0–100.0)
Platelets: 388 10*3/uL (ref 150–400)
RBC: 3.92 MIL/uL (ref 3.87–5.11)
RDW: 12.5 % (ref 11.5–15.5)
WBC: 20.1 10*3/uL — ABNORMAL HIGH (ref 4.0–10.5)
nRBC: 0 % (ref 0.0–0.2)

## 2022-11-23 LAB — URINALYSIS, ROUTINE W REFLEX MICROSCOPIC
Bilirubin Urine: NEGATIVE
Glucose, UA: NEGATIVE mg/dL
Ketones, ur: NEGATIVE mg/dL
Nitrite: NEGATIVE
Protein, ur: 100 mg/dL — AB
Specific Gravity, Urine: 1.01 (ref 1.005–1.030)
pH: 6 (ref 5.0–8.0)

## 2022-11-23 LAB — LACTIC ACID, PLASMA
Lactic Acid, Venous: 0.7 mmol/L (ref 0.5–1.9)
Lactic Acid, Venous: 0.7 mmol/L (ref 0.5–1.9)

## 2022-11-23 LAB — GLUCOSE, CAPILLARY: Glucose-Capillary: 90 mg/dL (ref 70–99)

## 2022-11-23 LAB — PREGNANCY, URINE: Preg Test, Ur: NEGATIVE

## 2022-11-23 LAB — CBG MONITORING, ED: Glucose-Capillary: 184 mg/dL — ABNORMAL HIGH (ref 70–99)

## 2022-11-23 MED ORDER — MELATONIN 3 MG PO TABS
3.0000 mg | ORAL_TABLET | Freq: Every evening | ORAL | Status: DC | PRN
  Administered 2022-11-23 – 2022-11-25 (×3): 3 mg via ORAL
  Filled 2022-11-23 (×3): qty 1

## 2022-11-23 MED ORDER — POTASSIUM CHLORIDE 2 MEQ/ML IV SOLN
INTRAVENOUS | Status: DC
  Filled 2022-11-23 (×2): qty 1000

## 2022-11-23 MED ORDER — SODIUM CHLORIDE 0.9 % IV BOLUS
1000.0000 mL | Freq: Once | INTRAVENOUS | Status: AC
  Administered 2022-11-23: 1000 mL via INTRAVENOUS

## 2022-11-23 MED ORDER — DIPHENHYDRAMINE HCL 50 MG/ML IJ SOLN
12.5000 mg | Freq: Once | INTRAMUSCULAR | Status: AC
  Administered 2022-11-23: 12.5 mg via INTRAVENOUS
  Filled 2022-11-23: qty 1

## 2022-11-23 MED ORDER — INSULIN ASPART 100 UNIT/ML IJ SOLN
0.0000 [IU] | Freq: Three times a day (TID) | INTRAMUSCULAR | Status: DC
  Administered 2022-11-24: 1 [IU] via SUBCUTANEOUS

## 2022-11-23 MED ORDER — FENTANYL CITRATE PF 50 MCG/ML IJ SOSY
50.0000 ug | PREFILLED_SYRINGE | Freq: Once | INTRAMUSCULAR | Status: AC
  Administered 2022-11-23: 50 ug via INTRAVENOUS
  Filled 2022-11-23: qty 1

## 2022-11-23 MED ORDER — SODIUM CHLORIDE 0.9 % IV SOLN
1.0000 g | Freq: Once | INTRAVENOUS | Status: AC
  Administered 2022-11-23: 1 g via INTRAVENOUS
  Filled 2022-11-23: qty 10

## 2022-11-23 MED ORDER — METOCLOPRAMIDE HCL 5 MG/ML IJ SOLN
5.0000 mg | Freq: Once | INTRAMUSCULAR | Status: AC
  Administered 2022-11-23: 5 mg via INTRAVENOUS
  Filled 2022-11-23: qty 2

## 2022-11-23 MED ORDER — KETOROLAC TROMETHAMINE 30 MG/ML IJ SOLN
30.0000 mg | Freq: Once | INTRAMUSCULAR | Status: AC
  Administered 2022-11-23: 30 mg via INTRAVENOUS
  Filled 2022-11-23: qty 1

## 2022-11-23 MED ORDER — HYDROMORPHONE HCL 1 MG/ML IJ SOLN: 0.5000 mg | INTRAMUSCULAR | Status: DC | PRN

## 2022-11-23 MED ORDER — ONDANSETRON HCL 4 MG/2ML IJ SOLN
4.0000 mg | Freq: Once | INTRAMUSCULAR | Status: AC
  Administered 2022-11-23: 4 mg via INTRAVENOUS
  Filled 2022-11-23: qty 2

## 2022-11-23 MED ORDER — ACETAMINOPHEN 325 MG PO TABS
650.0000 mg | ORAL_TABLET | Freq: Four times a day (QID) | ORAL | Status: DC | PRN
  Administered 2022-11-23 – 2022-11-25 (×5): 650 mg via ORAL
  Filled 2022-11-23 (×5): qty 2

## 2022-11-23 MED ORDER — INSULIN ASPART 100 UNIT/ML IJ SOLN: 0.0000 [IU] | Freq: Every day | INTRAMUSCULAR | Status: DC

## 2022-11-23 MED ORDER — FENOFIBRATE 160 MG PO TABS
160.0000 mg | ORAL_TABLET | Freq: Every day | ORAL | Status: DC
  Administered 2022-11-24 – 2022-11-26 (×3): 160 mg via ORAL
  Filled 2022-11-23 (×3): qty 1

## 2022-11-23 MED ORDER — POLYETHYLENE GLYCOL 3350 17 G PO PACK: 17.0000 g | PACK | Freq: Every day | ORAL | Status: DC | PRN

## 2022-11-23 MED ORDER — KETOROLAC TROMETHAMINE 15 MG/ML IJ SOLN
15.0000 mg | Freq: Once | INTRAMUSCULAR | Status: AC
  Administered 2022-11-23: 15 mg via INTRAVENOUS
  Filled 2022-11-23: qty 1

## 2022-11-23 MED ORDER — ATORVASTATIN CALCIUM 20 MG PO TABS
20.0000 mg | ORAL_TABLET | Freq: Every day | ORAL | Status: DC
  Administered 2022-11-24 – 2022-11-26 (×3): 20 mg via ORAL
  Filled 2022-11-23 (×3): qty 1

## 2022-11-23 MED ORDER — OXYCODONE HCL 5 MG PO TABS
5.0000 mg | ORAL_TABLET | Freq: Four times a day (QID) | ORAL | Status: DC | PRN
  Administered 2022-11-23 – 2022-11-24 (×2): 5 mg via ORAL
  Filled 2022-11-23 (×4): qty 1

## 2022-11-23 MED ORDER — SODIUM CHLORIDE 0.9 % IV SOLN
2.0000 g | INTRAVENOUS | Status: DC
  Administered 2022-11-24 – 2022-11-25 (×2): 2 g via INTRAVENOUS
  Filled 2022-11-23 (×2): qty 20

## 2022-11-23 MED ORDER — PROCHLORPERAZINE EDISYLATE 10 MG/2ML IJ SOLN: 5.0000 mg | Freq: Four times a day (QID) | INTRAMUSCULAR | Status: DC | PRN

## 2022-11-23 MED ORDER — IOHEXOL 300 MG/ML  SOLN
100.0000 mL | Freq: Once | INTRAMUSCULAR | Status: AC | PRN
  Administered 2022-11-23: 100 mL via INTRAVENOUS

## 2022-11-23 MED ORDER — SODIUM CHLORIDE 0.9 % IV SOLN: 2.0000 g | Freq: Once | INTRAVENOUS | Status: DC

## 2022-11-23 MED ORDER — ENOXAPARIN SODIUM 40 MG/0.4ML IJ SOSY
40.0000 mg | PREFILLED_SYRINGE | INTRAMUSCULAR | Status: DC
  Administered 2022-11-23 – 2022-11-25 (×3): 40 mg via SUBCUTANEOUS
  Filled 2022-11-23 (×3): qty 0.4

## 2022-11-23 NOTE — H&P (Addendum)
History and Physical  Jane Montes [id number] DOB: July 15, 1971 DOA: 11/23/2022  Referring physician: Accepted by Dr. Lonny Prude [facility], hospitalist service. PCP: Debbrah Alar, NP  Outpatient Specialists: GI, cardiology. Patient coming from: Home through [facility] ED  Chief Complaint: Right flank pain and fevers.  HPI: Jane Montes is a 52 y.o. female with medical history significant for Hypertension, hyperlipidemia, history of dizziness, palpitations, who initially presented to [facility] ED due to intermittent right flank pain, lightheadedness, headache, and syncope x 2  upon standing.  Associated with subjective fevers with Tmax 103 at home, treated with Tylenol.  No chest pain.  Endorses intermittent dyspnea.  She presented to the ED for further evaluation.  In the ED, vital signs notable for fever with Tmax 100.8 with lab studies notable for leukocytosis 20.1, positive urine analysis for pyuria.  Peripheral blood cultures were obtained.  Urine culture ordered.  The patient received empiric IV antibiotics Rocephin and IV analgesics, IV fluid bolus NS 1 L x 1.  [facility], hospitalist service, was asked to admit.  The patient was accepted by Dr. Lonny Prude, [facility], and transferred to [place] unit as observation status.  ED Course:   Review of Systems: Review of systems as noted in the HPI. All other systems reviewed and are negative.   Past Medical History:  Diagnosis Date   Adenoma of right adrenal gland 10/12/2018   Noted on MRI Abd, measuring 2.2 x 2.4 x 2.7 cm   Anxiety    Aortic atherosclerosis ([place]) 10/10/2018   Noted on [place]   Diabetes mellitus without complication ([place])    Fatty liver 10/12/2018   Noted on MRI Abd   Fatty liver 09/29/2020   GERD (gastroesophageal reflux disease) 09/16/2015   Mod. noted on KUB   Heart palpitations    High cholesterol    History of chest pain    History of dizziness    History of hiatal hernia 09/16/2015   Small, noted on KUB    History of kidney stones    History of migraine    Hydronephrosis of right kidney 10/12/2018   Mild, noted on MRI Abd   Hyperlipidemia    Left anterior fascicular block (LAFB) 01/10/2013   noted on EKG   Left kidney mass 10/12/2018   Hyperechoic mass mid left kidney measuring 1.0 x 1.0 x 1.2 cm, noted on [place] AB   Nephrolithiasis 10/12/2018   Right, Noted on MRI Abd   Pelvic pain    Chronic, right lower quadrant   Wears contact lenses    Wears glasses    Past Surgical History:  Procedure Laterality Date   ABDOMINAL HYSTERECTOMY     partial   CHOLECYSTECTOMY N/A 11/18/2020   Procedure: LAPAROSCOPIC CHOLECYSTECTOMY;  Surgeon: Coralie Keens, MD;  Location: [place];  Service: General;  Laterality: N/A;   ENDOMETRIAL ABLATION     Pelvic   HOLMIUM LASER APPLICATION Right [id number]   Procedure: HOLMIUM LASER APPLICATION;  Surgeon: Festus Aloe, MD;  Location: [facility];  Service: Urology;  Laterality: Right;   URETEROSCOPY WITH HOLMIUM LASER LITHOTRIPSY Right 11/15/2018   Procedure: CYSTOSCOPY, RETROGRADE, URETEROSCOPY WITH HOLMIUM LASER LITHOTRIPSY/STENT PLACEMENT;  Surgeon: Festus Aloe, MD;  Location: [facility]  Service: Urology;  Laterality: Right;   VAGINAL HYSTERECTOMY  2007   Partial   WISDOM TOOTH EXTRACTION      Social History:  reports that she has never smoked. She has never used smokeless tobacco. She reports current alcohol  use. She reports that she does not use drugs.   Allergies  Allergen Reactions   Cephalexin Nausea Only and Rash    Family History  Problem Relation Age of Onset   Diabetes Mother        Vickki Muff (recent diagnosis) causes thrombocytosis   Heart attack Mother 40   Heart disease Mother    Seizures Mother    Dementia Mother    Hypertension Father    COPD Father    Bipolar disorder Father    Parkinson's disease Father    Cervical cancer Sister    Alcohol abuse Brother     COPD Brother    Hyperlipidemia Brother    Hypertension Brother    Stroke Brother    Heart attack Brother    COPD Maternal Grandmother    Hyperlipidemia Maternal Grandmother    Heart attack Maternal Grandmother    Depression Paternal Grandmother    Heart disease Paternal Grandmother    Heart attack Paternal Grandmother    Colon cancer Neg Hx    Esophageal cancer Neg Hx    Stomach cancer Neg Hx    Rectal cancer Neg Hx       Prior to Admission medications   Medication Sig Start Date End Date Taking? Authorizing Provider  atorvastatin (LIPITOR) 20 MG tablet Take 1 tablet (20 mg total) by mouth daily. 10/25/22   Debbrah Alar, NP  fenofibrate (TRICOR) 145 MG tablet Take 1 tablet (145 mg total) by mouth daily. 10/25/22   Debbrah Alar, NP  indapamide (LOZOL) 1.25 MG tablet Take 1 tablet (1.25 mg total) by mouth every morning. 09/25/22     moxifloxacin (VIGAMOX) 0.5 % ophthalmic solution Place 1 drop into the left eye 3 (three) times daily. 05/23/22   Ann Held, DO  nitroGLYCERIN (NITROSTAT) 0.4 MG SL tablet Take 1 tablet, under your tongue, while sitting.  If no relief of pain may repeat NTG, one tab every 5 minutes up to 3 tablets total over 15 minutes. 08/23/21   Nahser, Wonda Cheng, MD  Semaglutide-Weight Management (WEGOVY) 2.4 MG/0.75ML SOAJ Inject 2.4 mg into the skin once a week. 10/25/22   Nahser, Wonda Cheng, MD  DULoxetine (CYMBALTA) 30 MG capsule 1 cap PO once daily for 2 weeks, then 1 cap PO QOD x 1 week then stop 08/25/20 09/29/20  Debbrah Alar, NP    Physical Exam: BP 107/70 (BP Location: Left Arm)   Pulse 99   Temp 98 F (36.7 C) (Oral)   Resp 18   Ht 5' 2"$  (1.575 m)   Wt 83 kg   SpO2 99%   BMI 33.47 kg/m   General: 52 y.o. year-old female well developed well nourished in no acute distress.  Alert and oriented x3. Cardiovascular: Regular rate and rhythm with no rubs or gallops.  No thyromegaly or JVD noted.  No lower extremity edema. 2/4 pulses  in all 4 extremities. Respiratory: Clear to auscultation with no wheezes or rales. Good inspiratory effort. Abdomen: Soft right flank pain, nondistended with normal bowel sounds x4 quadrants. Muskuloskeletal: No cyanosis, clubbing or edema noted bilaterally Neuro: CN II-XII intact, strength, sensation, reflexes Skin: No ulcerative lesions noted or rashes Psychiatry: Judgement and insight appear normal. Mood is appropriate for condition and setting          Labs on Admission:  Basic Metabolic Panel: Recent Labs  Lab 11/23/22 1457  NA 130*  K 3.1*  CL 102  CO2 20*  GLUCOSE 181*  BUN 24*  CREATININE 1.24*  CALCIUM 8.7*   Liver Function Tests: No results for input(s): "AST", "ALT", "ALKPHOS", "BILITOT", "PROT", "ALBUMIN" in the last 168 hours. No results for input(s): "LIPASE", "AMYLASE" in the last 168 hours. No results for input(s): "AMMONIA" in the last 168 hours. CBC: Recent Labs  Lab 11/23/22 1457  WBC 20.1*  HGB 11.5*  HCT 33.7*  MCV 86.0  PLT 388   Cardiac Enzymes: No results for input(s): "CKTOTAL", "CKMB", "CKMBINDEX", "TROPONINI" in the last 168 hours.  BNP (last 3 results) No results for input(s): "BNP" in the last 8760 hours.  ProBNP (last 3 results) No results for input(s): "PROBNP" in the last 8760 hours.  CBG: Recent Labs  Lab 11/23/22 1458  GLUCAP 184*    Radiological Exams on Admission: CT CHEST ABDOMEN PELVIS W CONTRAST  Result Date: 11/23/2022 CLINICAL DATA:  Sepsis. EXAM: CT CHEST, ABDOMEN, AND PELVIS WITH CONTRAST TECHNIQUE: Multidetector CT imaging of the chest, abdomen and pelvis was performed following the standard protocol during bolus administration of intravenous contrast. RADIATION DOSE REDUCTION: This exam was performed according to the departmental dose-optimization program which includes automated exposure control, adjustment of the mA and/or kV according to patient size and/or use of iterative reconstruction technique. CONTRAST:   175m OMNIPAQUE IOHEXOL 300 MG/ML  SOLN COMPARISON:  September 09, 2020.  May 02, 2020. FINDINGS: CT CHEST FINDINGS Cardiovascular: No significant vascular findings. Normal heart size. No pericardial effusion. Mediastinum/Nodes: No enlarged mediastinal, hilar, or axillary lymph nodes. Thyroid gland, trachea, and esophagus demonstrate no significant findings. Lungs/Pleura: No pneumothorax or pleural effusion is noted. 11 x 7 mm subpleural density is noted posteriorly in the right upper lobe best seen on image number 44 of series 4. May represent focal atelectasis or inflammation, but mass cannot be excluded. Musculoskeletal: No chest wall mass or suspicious bone lesions identified. CT ABDOMEN PELVIS FINDINGS Hepatobiliary: Hepatic steatosis. Status post cholecystectomy. No biliary dilatation is noted. Pancreas: Unremarkable. No pancreatic ductal dilatation or surrounding inflammatory changes. Spleen: Normal in size without focal abnormality. Adrenals/Urinary Tract: Stable right adrenal adenoma. Left adrenal gland is unremarkable. Left kidney is unremarkable. However, patchy low densities are noted involving the right kidney most apparent on delayed post-contrast images, most consistent with pyelonephritis. No hydronephrosis or renal obstruction is noted. Urinary bladder is unremarkable. Stomach/Bowel: Stomach is within normal limits. Appendix appears normal. No evidence of bowel wall thickening, distention, or inflammatory changes. Vascular/Lymphatic: No significant vascular findings are present. No enlarged abdominal or pelvic lymph nodes. Reproductive: Status post hysterectomy. No adnexal masses. Other: No abdominal wall hernia or abnormality. No abdominopelvic ascites. Musculoskeletal: No acute or significant osseous findings. IMPRESSION: Findings consistent with right pyelonephritis. No definite abscess is noted. Hepatic steatosis. 11 x 7 mm subpleural density is noted medially in the right upper lobe which may  simply represent atelectasis or focal inflammation, but neoplasm cannot be excluded. Consider one of the following in 3 months for both low-risk and high-risk individuals: (a) repeat chest CT, (b) follow-up PET-CT, or (c) tissue sampling. This recommendation follows the consensus statement: Guidelines for Management of Incidental Pulmonary Nodules Detected on CT Images: From the Fleischner Society 2017; Radiology 2017; 284:228-243. Stable right adrenal adenoma. Electronically Signed   By: JMarijo ConceptionM.D.   On: 11/23/2022 16:28   CT Head Wo Contrast  Result Date: 11/23/2022 CLINICAL DATA:  Headache, new onset. EXAM: CT HEAD WITHOUT CONTRAST TECHNIQUE: Contiguous axial images were obtained from the base of the skull through the vertex without intravenous contrast. RADIATION  DOSE REDUCTION: This exam was performed according to the departmental dose-optimization program which includes automated exposure control, adjustment of the mA and/or kV according to patient size and/or use of iterative reconstruction technique. COMPARISON:  CT head dated January 07, 2013 FINDINGS: Brain: No evidence of acute infarction, hemorrhage, hydrocephalus, extra-axial collection or mass lesion/mass effect. Vascular: No hyperdense vessel or unexpected calcification. Skull: Normal. Negative for fracture or focal lesion. Sinuses/Orbits: No acute finding. Other: None. IMPRESSION: No acute intracranial pathology. Electronically Signed   By: Keane Police D.O.   On: 11/23/2022 16:21    EKG: I independently viewed the EKG done and my findings are as followed: Sinus tachycardia rate of 114 with nonspecific ST-T changes.  QTc 434.  Assessment/Plan Present on Admission:  Acute pyelonephritis  Principal Problem:   Acute pyelonephritis  Acute right pyelonephritis, seen on CT scan, POA Meets SIRS criteria Intermittent right flank pain UA positive for pyuria Leukocytosis, fever Follow urine culture and peripheral blood cultures x 2,  for ID and sensitivities. Continue Rocephin 2 g daily started in the ED. Gentle IV fluid hydration LR KCl 40 mill equivalent at 50 cc/h x 1 day Pain control as needed and as needed bowel regimen  Hypovolemic hyponatremia Sodium 130 IV fluid hydration Encourage oral protein calorie intake Repeat BMP in the morning  Syncope, suspect secondary to hypovolemia Gentle IV fluid hydration Obtain orthostatic vital signs Sinus tachycardia on twelve-lead EKG 2D echo to rule out superimposed cardiac source  Hypokalemia Serum potassium 3.1 Repleted intravenously Obtain magnesium level Repeat BMP and replete electrolytes as indicated  Type 2 diabetes with hyperglycemia Serum glucose on admission 181 Obtain hemoglobin A1c Heart healthy, modified diet Start insulin sign scale.  Hyperlipidemia Resume home regimen  CKD 3A Appears to be at her baseline creatinine 1.24 with GFR 53 Avoid nephrotoxic agents, dehydration and hypotension Gentle IV fluid hydration x 1 day, encourage oral intake Monitor urine output Repeat BMP in the morning  Mild normocytic anemia No reported overt bleeding Hemoglobin on presentation 11.5 K Monitor H&H  History of nephrolithiasis No acute issues  Severe headache CT head nonacute No focal deficits Headache cocktail  Incidental finding, right upper lobe pulmonary nodule, seen on CT scan Personally reviewed The patient was made aware of this finding. Recommended repeat CT chest in 3 months Follow-up with Dr. Harless Nakayama pulmonary, outpatient.  Incidental finding, hepatic steatosis Weight loss outpatient with regular physical activity and healthy dieting.    DVT prophylaxis: Subcu Lovenox daily  Code Status: Full code  Family Communication: Daughter at bedside  Disposition Plan: Admitted to [place] unit by Dr. Lonny Prude, [facility].  Consults called: None.  Admission status: Observation status.  Recommend switching to inpatient status in the  morning.  Will benefit from 48 hours of IV antibiotics.   Status is: Observation    Kayleen Memos MD [facility] Pager [phone]  If 7PM-7AM, please contact night-coverage [email or web address] Password [facility]  11/23/2022, 9:53 PM

## 2022-11-23 NOTE — ED Provider Notes (Signed)
Emergency Department Provider Note   I have reviewed the triage vital signs and the nursing notes.   HISTORY  Chief Complaint Loss of Consciousness   HPI Jane Montes is a 52 y.o. female with past history reviewed below including diabetes, high cholesterol, prior kidney stones presents to the emergency department with feeling unwell for the past several weeks but developing fever yesterday along with headache, joint pain, urine frequency.  She reports feeling lightheadedness in addition to fatigue leading to 2 episodes of syncope upon standing.  She is been running temperatures at home of 103 F and treating with Tylenol.  She has had some right flank pain as well but notes it has been more intermittent and less severe.  No chest pain but some shortness of breath noted as well.   Past Medical History:  Diagnosis Date   Adenoma of right adrenal gland 10/12/2018   Noted on MRI Abd, measuring 2.2 x 2.4 x 2.7 cm   Anxiety    Aortic atherosclerosis ([place]) 10/10/2018   Noted on [place]   Diabetes mellitus without complication ([place])    Fatty liver 10/12/2018   Noted on MRI Abd   Fatty liver 09/29/2020   GERD (gastroesophageal reflux disease) 09/16/2015   Mod. noted on KUB   Heart palpitations    High cholesterol    History of chest pain    History of dizziness    History of hiatal hernia 09/16/2015   Small, noted on KUB   History of kidney stones    History of migraine    Hydronephrosis of right kidney 10/12/2018   Mild, noted on MRI Abd   Hyperlipidemia    Left anterior fascicular block (LAFB) 01/10/2013   noted on EKG   Left kidney mass 10/12/2018   Hyperechoic mass mid left kidney measuring 1.0 x 1.0 x 1.2 cm, noted on [place] AB   Nephrolithiasis 10/12/2018   Right, Noted on MRI Abd   Pelvic pain    Chronic, right lower quadrant   Wears contact lenses    Wears glasses     Review of Systems  Constitutional: Positive fever/chills and weakness.  Eyes: No visual  changes. ENT: No sore throat. Cardiovascular: Denies chest pain. Positive syncope.  Respiratory: Denies shortness of breath. Gastrointestinal: Positive right flank/abdominal pain. Positive nausea, no vomiting.  No diarrhea.  No constipation. Genitourinary: Negative for dysuria. Positive urine frequency.  Musculoskeletal: Negative for back pain. Skin: Negative for rash. Neurological: Negative for headaches, focal weakness or numbness.  ____________________________________________   PHYSICAL EXAM:  VITAL SIGNS: ED Triage Vitals  Enc Vitals Group     BP 11/23/22 1447 112/75     Pulse Rate 11/23/22 1447 (!) 118     Resp 11/23/22 1447 18     Temp 11/23/22 1447 (!) 100.8 F (38.2 C)     Temp Source 11/23/22 1447 Oral     SpO2 11/23/22 1447 98 %     Weight 11/23/22 1447 182 lb 15.7 oz (83 kg)     Height 11/23/22 1447 5' 2"$  (1.575 m)   Constitutional: Alert and oriented. Well appearing and in no acute distress. Eyes: Conjunctivae are normal.  Head: Atraumatic. Nose: No congestion/rhinnorhea. Mouth/Throat: Mucous membranes are dry.  Neck: No stridor. No meningismus. Cardiovascular: Tachycardia. Good peripheral circulation. Grossly normal heart sounds.   Respiratory: Normal respiratory effort.  No retractions. Lungs CTAB. Gastrointestinal: Soft and nontender. No distention. Positive right CVA tenderness.  Musculoskeletal: No gross deformities of extremities. Neurologic:  Normal  speech and language.  Skin:  Skin is warm, dry and intact. No rash noted. ____________________________________________   LABS (all labs ordered are listed, but only abnormal results are displayed)  Labs Reviewed  BASIC METABOLIC PANEL - Abnormal; Notable for the following components:      Result Value   Sodium 130 (*)    Potassium 3.1 (*)    CO2 20 (*)    Glucose, Bld 181 (*)    BUN 24 (*)    Creatinine, Ser 1.24 (*)    Calcium 8.7 (*)    GFR, Estimated 53 (*)    All other components within normal  limits  CBC - Abnormal; Notable for the following components:   WBC 20.1 (*)    Hemoglobin 11.5 (*)    HCT 33.7 (*)    All other components within normal limits  URINALYSIS, ROUTINE W REFLEX MICROSCOPIC - Abnormal; Notable for the following components:   Hgb urine dipstick SMALL (*)    Protein, ur 100 (*)    Leukocytes,Ua SMALL (*)    All other components within normal limits  URINALYSIS, MICROSCOPIC (REFLEX) - Abnormal; Notable for the following components:   Bacteria, UA MANY (*)    Non Squamous Epithelial PRESENT (*)    All other components within normal limits  CBC - Abnormal; Notable for the following components:   WBC 12.1 (*)    RBC 3.51 (*)    Hemoglobin 10.6 (*)    HCT 31.6 (*)    All other components within normal limits  BASIC METABOLIC PANEL - Abnormal; Notable for the following components:   Potassium 3.4 (*)    Glucose, Bld 107 (*)    BUN 21 (*)    Creatinine, Ser 1.32 (*)    Calcium 8.8 (*)    GFR, Estimated 49 (*)    All other components within normal limits  GLUCOSE, CAPILLARY - Abnormal; Notable for the following components:   Glucose-Capillary 121 (*)    All other components within normal limits  BASIC METABOLIC PANEL - Abnormal; Notable for the following components:   Creatinine, Ser 1.15 (*)    GFR, Estimated 58 (*)    All other components within normal limits  CBC - Abnormal; Notable for the following components:   RBC 3.31 (*)    Hemoglobin 9.8 (*)    HCT 29.6 (*)    All other components within normal limits  GLUCOSE, CAPILLARY - Abnormal; Notable for the following components:   Glucose-Capillary 106 (*)    All other components within normal limits  GLUCOSE, CAPILLARY - Abnormal; Notable for the following components:   Glucose-Capillary 101 (*)    All other components within normal limits  CBC - Abnormal; Notable for the following components:   RBC 3.48 (*)    Hemoglobin 10.2 (*)    HCT 31.3 (*)    All other components within normal limits   CBG MONITORING, ED - Abnormal; Notable for the following components:   Glucose-Capillary 184 (*)    All other components within normal limits  RESP PANEL BY RT-PCR (RSV, FLU A&B, COVID)  RVPGX2  CULTURE, BLOOD (ROUTINE X 2)  CULTURE, BLOOD (ROUTINE X 2)  URINE CULTURE  PREGNANCY, URINE  LACTIC ACID, PLASMA  LACTIC ACID, PLASMA  HEMOGLOBIN A1C  MAGNESIUM  PHOSPHORUS  GLUCOSE, CAPILLARY  HIV ANTIBODY (ROUTINE TESTING W REFLEX)  GLUCOSE, CAPILLARY  PROCALCITONIN  PROCALCITONIN  GLUCOSE, CAPILLARY  PROCALCITONIN  GLUCOSE, CAPILLARY  GLUCOSE, CAPILLARY  GLUCOSE, CAPILLARY   ____________________________________________  EKG   EKG Interpretation  Date/Time:  Thursday November 23 2022 14:54:03 EST Ventricular Rate:  114 PR Interval:  153 QRS Duration: 87 QT Interval:  315 QTC Calculation: 434 R Axis:   -67 Text Interpretation: Sinus tachycardia Left anterior fascicular block Low voltage, precordial leads Consider anterior infarct Baseline wander in lead(s) III aVL aVF Confirmed by Nanda Quinton [phone]) on 11/23/2022 2:55:55 PM       ____________________________________________   PROCEDURES  Procedure(s) performed:   Procedures  CRITICAL CARE Performed by: Margette Fast Total critical care time: 35 minutes Critical care time was exclusive of separately billable procedures and treating other patients. Critical care was necessary to treat or prevent imminent or life-threatening deterioration. Critical care was time spent personally by me on the following activities: development of treatment plan with patient and/or surrogate as well as nursing, discussions with consultants, evaluation of patient's response to treatment, examination of patient, obtaining history from patient or surrogate, ordering and performing treatments and interventions, ordering and review of laboratory studies, ordering and review of radiographic studies, pulse oximetry and re-evaluation of patient's  condition.  Nanda Quinton, MD Emergency Medicine  ____________________________________________   INITIAL IMPRESSION / ASSESSMENT AND PLAN / ED COURSE  Pertinent labs & imaging results that were available during my care of the patient were reviewed by me and considered in my medical decision making (see chart for details).   This patient is Presenting for Evaluation of syncope/fever, which does require a range of treatment options, and is a complaint that involves a high risk of morbidity and mortality.  The Differential Diagnoses include sepsis, pyelonephritis, dehydration, cardiogenic syncope, etc.  Critical Interventions-    Medications  sodium chloride 0.9 % bolus 1,000 mL (0 mLs Intravenous Stopped 11/23/22 1747)  iohexol (OMNIPAQUE) 300 MG/ML solution 100 mL (100 mLs Intravenous Contrast Given 11/23/22 1601)  cefTRIAXone (ROCEPHIN) 1 g in sodium chloride 0.9 % 100 mL IVPB (0 g Intravenous Stopped 11/23/22 1747)  ondansetron (ZOFRAN) injection 4 mg (4 mg Intravenous Given 11/23/22 1649)  ketorolac (TORADOL) 30 MG/ML injection 30 mg (30 mg Intravenous Given 11/23/22 1656)  fentaNYL (SUBLIMAZE) injection 50 mcg (50 mcg Intravenous Given 11/23/22 1937)  ketorolac (TORADOL) 15 MG/ML injection 15 mg (15 mg Intravenous Given 11/23/22 2244)  metoCLOPramide (REGLAN) injection 5 mg (5 mg Intravenous Given 11/23/22 2245)  diphenhydrAMINE (BENADRYL) injection 12.5 mg (12.5 mg Intravenous Given 11/23/22 2245)  potassium chloride SA (KLOR-CON M) CR tablet 40 mEq (40 mEq Oral Given 11/24/22 2212)    Reassessment after intervention: Symptoms improved.    I did obtain Additional Historical Information from family at bedside.   Clinical Laboratory Tests Ordered, included UTI on UA. Mild hypokalemia and renal insufficiency. Leukocytosis of 20.1.  Radiologic Tests Ordered, included CT head and CT chest, abd/pelvis. I independently interpreted the images and agree with radiology interpretation.   Cardiac  Monitor Tracing which shows NSR.   Social Determinants of Health Risk tachycardia.   Consult complete with [facility]. Plan for admit.   Medical Decision Making: Summary:  Patient presents to the ED with fever, flank pain, and syncope. No ureteral stone on CT but pyelonephritis, likely the source of fever. Doubt CNS infection. No ectopy on monitor here.   Reevaluation with update and discussion with patient and family. Plan for admit.   Patient's presentation is most consistent with acute presentation with potential threat to life or bodily function.   Disposition: admit  ____________________________________________  FINAL CLINICAL IMPRESSION(S) / ED DIAGNOSES  Final  diagnoses:  Pyelonephritis  Syncope and collapse     NEW OUTPATIENT MEDICATIONS STARTED DURING THIS VISIT:  Discharge Medication List as of 11/26/2022 12:13 PM     START taking these medications   Details  cefpodoxime (VANTIN) 200 MG tablet Take 1 tablet (200 mg total) by mouth 2 (two) times daily for 7 days., Starting Sun 11/26/2022, Until Sun 12/03/2022, Normal        Note:  This document was prepared using Dragon voice recognition software and may include unintentional dictation errors.  Nanda Quinton, MD, [facility] Emergency Medicine    Dejay Kronk, [facility], MD 11/28/22 [phone]

## 2022-11-23 NOTE — ED Notes (Signed)
Called [facility] for transport (to [id number]) @1935$ 

## 2022-11-23 NOTE — ED Notes (Signed)
ED TO INPATIENT HANDOFF REPORT  ED Nurse Name and Phone #: Newman Pies [phone]  S Name/Age/Gender Jane Montes 52 y.o. female Room/Bed: MH05/MH05  Code Status   Code Status: Not on file  Home/SNF/Other Home Patient oriented to: self, place, time, and situation Is this baseline? Yes   Triage Complete: Triage complete  Chief Complaint Acute pyelonephritis [N10]  Triage Note Pt states multiple syncopal episodes over past 2 weeks Reports dizziness and stiff neck that started yesterday  Also reports high fever of 103 at home  Generalized joint pain    Allergies Allergies  Allergen Reactions   Cephalexin Nausea Only and Rash    Level of Care/Admitting Diagnosis ED Disposition     ED Disposition  Admit   Condition  --   Comment  Hospital Area: [facility] [100102]  Level of Care: Med-Surg [16]  Interfacility transfer: Yes  May place patient in observation at [facility] or [place] if equivalent level of care is available:: Yes  Covid Evaluation: Confirmed COVID Negative  Diagnosis: Acute pyelonephritis [id number]  Admitting Physician: Mariel Aloe [id number]  Attending Physician: Margette Fast [id number]          B Medical/Surgery History Past Medical History:  Diagnosis Date   Adenoma of right adrenal gland 10/12/2018   Noted on MRI Abd, measuring 2.2 x 2.4 x 2.7 cm   Anxiety    Aortic atherosclerosis ([place]) 10/10/2018   Noted on [place]   Diabetes mellitus without complication ([place])    Fatty liver 10/12/2018   Noted on MRI Abd   Fatty liver 09/29/2020   GERD (gastroesophageal reflux disease) 09/16/2015   Mod. noted on KUB   Heart palpitations    High cholesterol    History of chest pain    History of dizziness    History of hiatal hernia 09/16/2015   Small, noted on KUB   History of kidney stones    History of migraine    Hydronephrosis of right kidney 10/12/2018   Mild, noted on MRI Abd   Hyperlipidemia    Left  anterior fascicular block (LAFB) 01/10/2013   noted on EKG   Left kidney mass 10/12/2018   Hyperechoic mass mid left kidney measuring 1.0 x 1.0 x 1.2 cm, noted on [place] AB   Nephrolithiasis 10/12/2018   Right, Noted on MRI Abd   Pelvic pain    Chronic, right lower quadrant   Wears contact lenses    Wears glasses    Past Surgical History:  Procedure Laterality Date   ABDOMINAL HYSTERECTOMY     partial   CHOLECYSTECTOMY N/A 11/18/2020   Procedure: LAPAROSCOPIC CHOLECYSTECTOMY;  Surgeon: Coralie Keens, MD;  Location: [place];  Service: General;  Laterality: N/A;   ENDOMETRIAL ABLATION     Pelvic   HOLMIUM LASER APPLICATION Right [id number]   Procedure: HOLMIUM LASER APPLICATION;  Surgeon: Festus Aloe, MD;  Location: [facility];  Service: Urology;  Laterality: Right;   URETEROSCOPY WITH HOLMIUM LASER LITHOTRIPSY Right 11/15/2018   Procedure: CYSTOSCOPY, RETROGRADE, URETEROSCOPY WITH HOLMIUM LASER LITHOTRIPSY/STENT PLACEMENT;  Surgeon: Festus Aloe, MD;  Location[facility]  Service: Urology;  Laterality: Right;   VAGINAL HYSTERECTOMY  2007   Partial   WISDOM TOOTH EXTRACTION       A IV Location/Drains/Wounds Patient Lines/Drains/Airways Status     Active Line/Drains/Airways     Name Placement date Placement time Site Days   Peripheral IV 11/23/22 20 G Right  Antecubital 11/23/22  1458  Antecubital  less than 1   Ureteral Drain/Stent Right ureter 6 Fr. 11/15/18  1245  Right ureter  1469   Incision - 4 Ports Abdomen Umbilicus Upper;Mid Upper;Lateral;Right Right;Medial 11/18/20  1205  -- 735            Intake/Output Last 24 hours  Intake/Output Summary (Last 24 hours) at 11/23/2022 1922 Last data filed at 11/23/2022 1747 Gross per 24 hour  Intake 1100 ml  Output --  Net 1100 ml    Labs/Imaging Results for orders placed or performed during the hospital encounter of 11/23/22 (from the past 48 hour(s))  Resp panel  by RT-PCR (RSV, Flu A&B, Covid) Anterior Nasal Swab     Status: None   Collection Time: 11/23/22  2:57 PM   Specimen: Anterior Nasal Swab  Result Value Ref Range   SARS Coronavirus 2 by RT PCR NEGATIVE NEGATIVE    Comment: (NOTE) SARS-CoV-2 target nucleic acids are NOT DETECTED.  The SARS-CoV-2 RNA is generally detectable in upper respiratory specimens during the acute phase of infection. The lowest concentration of SARS-CoV-2 viral copies this assay can detect is 138 copies/mL. A negative result does not preclude SARS-Cov-2 infection and should not be used as the sole basis for treatment or other patient management decisions. A negative result may occur with  improper specimen collection/handling, submission of specimen other than nasopharyngeal swab, presence of viral mutation(s) within the areas targeted by this assay, and inadequate number of viral copies(<138 copies/mL). A negative result must be combined with clinical observations, patient history, and epidemiological information. The expected result is Negative.  Fact Sheet for Patients:  EntrepreneurPulse.com.au  Fact Sheet for Healthcare Providers:  IncredibleEmployment.be  This test is no t yet approved or cleared by the [place] FDA and  has been authorized for detection and/or diagnosis of SARS-CoV-2 by FDA under an Emergency Use Authorization (EUA). This EUA will remain  in effect (meaning this test can be used) for the duration of the COVID-19 declaration under Section 564(b)(1) of the Act, 21 U.S.C.section 360bbb-3(b)(1), unless the authorization is terminated  or revoked sooner.       Influenza A by PCR NEGATIVE NEGATIVE   Influenza B by PCR NEGATIVE NEGATIVE    Comment: (NOTE) The Xpert Xpress SARS-CoV-2/FLU/RSV plus assay is intended as an aid in the diagnosis of influenza from Nasopharyngeal swab specimens and should not be used as a sole basis for treatment. Nasal  washings and aspirates are unacceptable for Xpert Xpress SARS-CoV-2/FLU/RSV testing.  Fact Sheet for Patients: EntrepreneurPulse.com.au  Fact Sheet for Healthcare Providers: IncredibleEmployment.be  This test is not yet approved or cleared by the [place] FDA and has been authorized for detection and/or diagnosis of SARS-CoV-2 by FDA under an Emergency Use Authorization (EUA). This EUA will remain in effect (meaning this test can be used) for the duration of the COVID-19 declaration under Section 564(b)(1) of the Act, 21 U.S.C. section 360bbb-3(b)(1), unless the authorization is terminated or revoked.     Resp Syncytial Virus by PCR NEGATIVE NEGATIVE    Comment: (NOTE) Fact Sheet for Patients: EntrepreneurPulse.com.au  Fact Sheet for Healthcare Providers: IncredibleEmployment.be  This test is not yet approved or cleared by the [place] FDA and has been authorized for detection and/or diagnosis of SARS-CoV-2 by FDA under an Emergency Use Authorization (EUA). This EUA will remain in effect (meaning this test can be used) for the duration of the COVID-19 declaration under Section 564(b)(1) of the Act, 21  U.S.C. section 360bbb-3(b)(1), unless the authorization is terminated or revoked.  Performed at [facility], [address] [place], [place] [id number]   Basic metabolic panel     Status: Abnormal   Collection Time: 11/23/22  2:57 PM  Result Value Ref Range   Sodium 130 (L) 135 - 145 mmol/L   Potassium 3.1 (L) 3.5 - 5.1 mmol/L   Chloride 102 98 - 111 mmol/L   CO2 20 (L) 22 - 32 mmol/L   Glucose, Bld 181 (H) 70 - 99 mg/dL    Comment: Glucose reference range applies only to samples taken after fasting for at least 8 hours.   BUN 24 (H) 6 - 20 mg/dL   Creatinine, Ser 1.24 (H) 0.44 - 1.00 mg/dL   Calcium 8.7 (L) 8.9 - 10.3 mg/dL   GFR, Estimated 53 (L) >60 mL/min    Comment:  (NOTE) Calculated using the CKD-EPI Creatinine Equation (2021)    Anion gap 8 5 - 15    Comment: Performed at [facility], [address] [place], [place] [address]  CBC     Status: Abnormal   Collection Time: 11/23/22  2:57 PM  Result Value Ref Range   WBC 20.1 (H) 4.0 - 10.5 K/uL   RBC 3.92 3.87 - 5.11 MIL/uL   Hemoglobin 11.5 (L) 12.0 - 15.0 g/dL   HCT 33.7 (L) 36.0 - 46.0 %   MCV 86.0 80.0 - 100.0 fL   MCH 29.3 26.0 - 34.0 pg   MCHC 34.1 30.0 - 36.0 g/dL   RDW 12.5 11.5 - 15.5 %   Platelets 388 150 - 400 K/uL   nRBC 0.0 0.0 - 0.2 %    Comment: Performed at [facility], [address] [place], [place] [address]  CBG monitoring, ED     Status: Abnormal   Collection Time: 11/23/22  2:58 PM  Result Value Ref Range   Glucose-Capillary 184 (H) 70 - 99 mg/dL    Comment: Glucose reference range applies only to samples taken after fasting for at least 8 hours.  Urinalysis, Routine w reflex microscopic -Urine, Clean Catch     Status: Abnormal   Collection Time: 11/23/22  3:45 PM  Result Value Ref Range   Color, Urine YELLOW YELLOW   APPearance CLEAR CLEAR   Specific Gravity, Urine 1.010 1.005 - 1.030   pH 6.0 5.0 - 8.0   Glucose, UA NEGATIVE NEGATIVE mg/dL   Hgb urine dipstick SMALL (A) NEGATIVE   Bilirubin Urine NEGATIVE NEGATIVE   Ketones, ur NEGATIVE NEGATIVE mg/dL   Protein, ur 100 (A) NEGATIVE mg/dL   Nitrite NEGATIVE NEGATIVE   Leukocytes,Ua SMALL (A) NEGATIVE    Comment: Performed at [facility], [address] [place], [place] [address]  Pregnancy, urine     Status: None   Collection Time: 11/23/22  3:45 PM  Result Value Ref Range   Preg Test, Ur NEGATIVE NEGATIVE    Comment:        THE SENSITIVITY OF THIS METHODOLOGY IS >20 mIU/mL. Performed at [facility], [address] [place], [place] [address]   Urinalysis, Microscopic (reflex)     Status: Abnormal   Collection Time: 11/23/22  3:45 PM  Result Value Ref Range    RBC / HPF 6-10 0 - 5 RBC/hpf   WBC, UA 21-50 0 - 5 WBC/hpf   Bacteria, UA MANY (A) NONE SEEN   Squamous Epithelial /  HPF 0-5 0 - 5 /HPF   Non Squamous Epithelial PRESENT (A) NONE SEEN    Comment: Performed at [facility], [address] [place], [place] [address]  Lactic acid, plasma     Status: None   Collection Time: 11/23/22  4:24 PM  Result Value Ref Range   Lactic Acid, Venous 0.7 0.5 - 1.9 mmol/L    Comment: Performed at [facility], [address] [place], [place] [address]  Lactic acid, plasma     Status: None   Collection Time: 11/23/22  5:41 PM  Result Value Ref Range   Lactic Acid, Venous 0.7 0.5 - 1.9 mmol/L    Comment: Performed at [facility], [address] [place], [place] [address]   CT CHEST ABDOMEN PELVIS W CONTRAST  Result Date: 11/23/2022 CLINICAL DATA:  Sepsis. EXAM: CT CHEST, ABDOMEN, AND PELVIS WITH CONTRAST TECHNIQUE: Multidetector CT imaging of the chest, abdomen and pelvis was performed following the standard protocol during bolus administration of intravenous contrast. RADIATION DOSE REDUCTION: This exam was performed according to the departmental dose-optimization program which includes automated exposure control, adjustment of the mA and/or kV according to patient size and/or use of iterative reconstruction technique. CONTRAST:  137m OMNIPAQUE IOHEXOL 300 MG/ML  SOLN COMPARISON:  September 09, 2020.  May 02, 2020. FINDINGS: CT CHEST FINDINGS Cardiovascular: No significant vascular findings. Normal heart size. No pericardial effusion. Mediastinum/Nodes: No enlarged mediastinal, hilar, or axillary lymph nodes. Thyroid gland, trachea, and esophagus demonstrate no significant findings. Lungs/Pleura: No pneumothorax or pleural effusion is noted. 11 x 7 mm subpleural density is noted posteriorly in the right upper lobe best seen on image number 44 of series 4. May represent focal atelectasis or inflammation, but mass cannot be  excluded. Musculoskeletal: No chest wall mass or suspicious bone lesions identified. CT ABDOMEN PELVIS FINDINGS Hepatobiliary: Hepatic steatosis. Status post cholecystectomy. No biliary dilatation is noted. Pancreas: Unremarkable. No pancreatic ductal dilatation or surrounding inflammatory changes. Spleen: Normal in size without focal abnormality. Adrenals/Urinary Tract: Stable right adrenal adenoma. Left adrenal gland is unremarkable. Left kidney is unremarkable. However, patchy low densities are noted involving the right kidney most apparent on delayed post-contrast images, most consistent with pyelonephritis. No hydronephrosis or renal obstruction is noted. Urinary bladder is unremarkable. Stomach/Bowel: Stomach is within normal limits. Appendix appears normal. No evidence of bowel wall thickening, distention, or inflammatory changes. Vascular/Lymphatic: No significant vascular findings are present. No enlarged abdominal or pelvic lymph nodes. Reproductive: Status post hysterectomy. No adnexal masses. Other: No abdominal wall hernia or abnormality. No abdominopelvic ascites. Musculoskeletal: No acute or significant osseous findings. IMPRESSION: Findings consistent with right pyelonephritis. No definite abscess is noted. Hepatic steatosis. 11 x 7 mm subpleural density is noted medially in the right upper lobe which may simply represent atelectasis or focal inflammation, but neoplasm cannot be excluded. Consider one of the following in 3 months for both low-risk and high-risk individuals: (a) repeat chest CT, (b) follow-up PET-CT, or (c) tissue sampling. This recommendation follows the consensus statement: Guidelines for Management of Incidental Pulmonary Nodules Detected on CT Images: From the Fleischner Society 2017; Radiology 2017; 284:228-243. Stable right adrenal adenoma. Electronically Signed   By: JMarijo ConceptionM.D.   On: 11/23/2022 16:28   CT Head Wo Contrast  Result Date: 11/23/2022 CLINICAL DATA:   Headache, new onset. EXAM: CT HEAD WITHOUT CONTRAST TECHNIQUE: Contiguous axial images were obtained from the base of the skull through the vertex  without intravenous contrast. RADIATION DOSE REDUCTION: This exam was performed according to the departmental dose-optimization program which includes automated exposure control, adjustment of the mA and/or kV according to patient size and/or use of iterative reconstruction technique. COMPARISON:  CT head dated January 07, 2013 FINDINGS: Brain: No evidence of acute infarction, hemorrhage, hydrocephalus, extra-axial collection or mass lesion/mass effect. Vascular: No hyperdense vessel or unexpected calcification. Skull: Normal. Negative for fracture or focal lesion. Sinuses/Orbits: No acute finding. Other: None. IMPRESSION: No acute intracranial pathology. Electronically Signed   By: Keane Police D.O.   On: 11/23/2022 16:21    Pending Labs Unresulted Labs (From admission, onward)     Start     Ordered   11/23/22 1541  Culture, blood (routine x 2)  BLOOD CULTURE X 2,   STAT      11/23/22 1542            Vitals/Pain Today's Vitals   11/23/22 1627 11/23/22 1632 11/23/22 1633 11/23/22 1700  BP:  102/71  112/73  Pulse:  94 90 86  Resp:   15 20  Temp:      TempSrc:      SpO2:  99% 99% 100%  Weight:      Height:      PainSc: 8        Isolation Precautions Airborne and Contact precautions  Medications Medications  sodium chloride 0.9 % bolus 1,000 mL (0 mLs Intravenous Stopped 11/23/22 1747)  iohexol (OMNIPAQUE) 300 MG/ML solution 100 mL (100 mLs Intravenous Contrast Given 11/23/22 1601)  cefTRIAXone (ROCEPHIN) 1 g in sodium chloride 0.9 % 100 mL IVPB (0 g Intravenous Stopped 11/23/22 1747)  ondansetron (ZOFRAN) injection 4 mg (4 mg Intravenous Given 11/23/22 1649)  ketorolac (TORADOL) 30 MG/ML injection 30 mg (30 mg Intravenous Given 11/23/22 1656)    Mobility walks     Focused Assessments Neuro Assessment Handoff:  Swallow screen pass? Yes           Neuro Assessment:   Neuro Checks:      Has TPA been given? No If patient is a Neuro Trauma and patient is going to OR before floor call report to [place] nurse: [phone] or [phone]   R Recommendations: See Admitting Provider Note  Report given to:   Additional Notes: Pyelonephritis. Works as a Marine scientist in Microbiologist at [employer]

## 2022-11-23 NOTE — Telephone Encounter (Signed)
Initial Comment Caller states she wants to make an appt. She also states she has a fever of 103,dizziness,headache and joint pain. She has a strong history of kidney stones so she has right side pain as well. Translation No Nurse Assessment Nurse: Raphael Gibney, RN, Vera Date/Time (Eastern Time): 11/23/2022 1:50:22 PM Confirm and document reason for call. If symptomatic, describe symptoms. ---Caller states she has temp of 103. Fever started last night. has severe headache. has dizziness. slight nausea. has had pain in her right flank area. Has had kidney stones. Home COVID test was negative. Does the patient have any new or worsening symptoms? ---Yes Will a triage be completed? ---Yes Related visit to physician within the last 2 weeks? ---No Does the PT have any chronic conditions? (i.e. diabetes, asthma, this includes High risk factors for pregnancy, etc.) ---Yes List chronic conditions. ---kidney stones Is the patient pregnant or possibly pregnant? (Ask all females between the ages of 71-55) ---No Is this a behavioral health or substance abuse call? ---No Guidelines Guideline Title Affirmed Question Affirmed Notes Nurse Date/Time (Eastern Time) Headache [1] SEVERE headache AND [2] fever Raphael Gibney, RN, Vanita Ingles 11/23/2022 1:53:50 PM PLEASE NOTE: All timestamps contained within this report are represented as [place] Standard Time. CONFIDENTIALTY NOTICE: This fax transmission is intended only for the addressee. It contains information that is legally privileged, confidential or otherwise protected from use or disclosure. If you are not the intended recipient, you are strictly prohibited from reviewing, disclosing, copying using or disseminating any of this information or taking any action in reliance on or regarding this information. If you have received this fax in error, please notify us immediately by telephone so that we can arrange for its return to [place]. Phone: [phone], Toll-Free:  [phone], Fax: [phone] Page: 2 of 2 Call Id: [id number] [place]. Time Eilene Ghazi Time) Disposition Final User 11/23/2022 1:57:13 PM Go to ED Now (or PCP triage) Yes Raphael Gibney, RN, Vanita Ingles Final Disposition 11/23/2022 1:57:13 PM Go to ED Now (or PCP triage) Yes Raphael Gibney, RN, Doreatha Lew Disagree/Comply Comply Caller Understands Yes PreDisposition Call Doctor Care Advice Given Per Guideline GO TO ED NOW (OR PCP TRIAGE): * IF NO PCP (PRIMARY CARE PROVIDER) SECOND-LEVEL TRIAGE: You need to be seen within the next hour. Go to the [facility] at _____________ [place] as soon as you can. Referrals [facility] - ED

## 2022-11-23 NOTE — Plan of Care (Signed)
Plan of Care Note for accepted transfer   Patient: Jane Montes MRN: [id number]   DOA: 11/23/2022  Facility requesting transfer: [facility] Requesting Provider: Nanda Quinton, MD Reason for transfer: Acute Pyelonephritis Facility course: Urinalysis suggests UTI. CT Chest/abd/pelvis with right pyelo. Toradol, Zofran, 1L NS IV fluid bolus and Ceftriaxone IV. Blood and urine cultures ordered  Plan of care: The patient is accepted for admission to [facility]  unit, at [facility]. Patient to continue antibiotics, IV fluids.   Author: Cordelia Poche, MD 11/23/2022  Check [email or web address] for on-call coverage.  Nursing staff, Please call [place] number on Amion as soon as patient's arrival, so appropriate admitting provider can evaluate the pt.

## 2022-11-23 NOTE — Telephone Encounter (Signed)
Patient called to schedule virtual visit with Melissa. Pt is having fever 103 (24 hrs) , massive headache, joint pain (covid neg) / SOB/Dizzy at times . Pt believes she is dehydrated. 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 Transferred to triage nurse for further eval

## 2022-11-23 NOTE — ED Triage Notes (Signed)
Pt states multiple syncopal episodes over past 2 weeks Reports dizziness and stiff neck that started yesterday  Also reports high fever of 103 at home  Generalized joint pain

## 2022-11-24 ENCOUNTER — Other Ambulatory Visit: Payer: Self-pay

## 2022-11-24 DIAGNOSIS — E119 Type 2 diabetes mellitus without complications: Secondary | ICD-10-CM | POA: Diagnosis not present

## 2022-11-24 DIAGNOSIS — K760 Fatty (change of) liver, not elsewhere classified: Secondary | ICD-10-CM | POA: Diagnosis not present

## 2022-11-24 DIAGNOSIS — Z1152 Encounter for screening for COVID-19: Secondary | ICD-10-CM | POA: Diagnosis not present

## 2022-11-24 DIAGNOSIS — N10 Acute tubulo-interstitial nephritis: Secondary | ICD-10-CM | POA: Diagnosis not present

## 2022-11-24 DIAGNOSIS — Z833 Family history of diabetes mellitus: Secondary | ICD-10-CM | POA: Diagnosis not present

## 2022-11-24 DIAGNOSIS — F419 Anxiety disorder, unspecified: Secondary | ICD-10-CM | POA: Diagnosis present

## 2022-11-24 DIAGNOSIS — E871 Hypo-osmolality and hyponatremia: Secondary | ICD-10-CM | POA: Insufficient documentation

## 2022-11-24 DIAGNOSIS — E876 Hypokalemia: Secondary | ICD-10-CM | POA: Diagnosis not present

## 2022-11-24 DIAGNOSIS — E669 Obesity, unspecified: Secondary | ICD-10-CM | POA: Diagnosis not present

## 2022-11-24 DIAGNOSIS — R55 Syncope and collapse: Secondary | ICD-10-CM | POA: Insufficient documentation

## 2022-11-24 DIAGNOSIS — E7800 Pure hypercholesterolemia, unspecified: Secondary | ICD-10-CM | POA: Diagnosis present

## 2022-11-24 DIAGNOSIS — I129 Hypertensive chronic kidney disease with stage 1 through stage 4 chronic kidney disease, or unspecified chronic kidney disease: Secondary | ICD-10-CM | POA: Diagnosis not present

## 2022-11-24 DIAGNOSIS — E785 Hyperlipidemia, unspecified: Secondary | ICD-10-CM | POA: Diagnosis not present

## 2022-11-24 DIAGNOSIS — N1831 Chronic kidney disease, stage 3a: Secondary | ICD-10-CM

## 2022-11-24 DIAGNOSIS — D649 Anemia, unspecified: Secondary | ICD-10-CM | POA: Diagnosis not present

## 2022-11-24 DIAGNOSIS — Z820 Family history of epilepsy and other diseases of the nervous system: Secondary | ICD-10-CM | POA: Diagnosis not present

## 2022-11-24 DIAGNOSIS — E861 Hypovolemia: Secondary | ICD-10-CM | POA: Diagnosis not present

## 2022-11-24 DIAGNOSIS — Z825 Family history of asthma and other chronic lower respiratory diseases: Secondary | ICD-10-CM | POA: Diagnosis not present

## 2022-11-24 DIAGNOSIS — Z8249 Family history of ischemic heart disease and other diseases of the circulatory system: Secondary | ICD-10-CM | POA: Diagnosis not present

## 2022-11-24 DIAGNOSIS — K219 Gastro-esophageal reflux disease without esophagitis: Secondary | ICD-10-CM | POA: Diagnosis present

## 2022-11-24 DIAGNOSIS — Z83438 Family history of other disorder of lipoprotein metabolism and other lipidemia: Secondary | ICD-10-CM | POA: Diagnosis not present

## 2022-11-24 DIAGNOSIS — A419 Sepsis, unspecified organism: Secondary | ICD-10-CM | POA: Diagnosis not present

## 2022-11-24 DIAGNOSIS — E1165 Type 2 diabetes mellitus with hyperglycemia: Secondary | ICD-10-CM | POA: Diagnosis not present

## 2022-11-24 DIAGNOSIS — Z823 Family history of stroke: Secondary | ICD-10-CM | POA: Diagnosis not present

## 2022-11-24 DIAGNOSIS — Z6833 Body mass index (BMI) 33.0-33.9, adult: Secondary | ICD-10-CM | POA: Diagnosis not present

## 2022-11-24 DIAGNOSIS — I10 Essential (primary) hypertension: Secondary | ICD-10-CM | POA: Diagnosis not present

## 2022-11-24 DIAGNOSIS — I700 Atherosclerosis of aorta: Secondary | ICD-10-CM | POA: Diagnosis present

## 2022-11-24 DIAGNOSIS — E1122 Type 2 diabetes mellitus with diabetic chronic kidney disease: Secondary | ICD-10-CM | POA: Diagnosis not present

## 2022-11-24 HISTORY — DX: Syncope and collapse: R55

## 2022-11-24 LAB — CBC
HCT: 31.6 % — ABNORMAL LOW (ref 36.0–46.0)
Hemoglobin: 10.6 g/dL — ABNORMAL LOW (ref 12.0–15.0)
MCH: 30.2 pg (ref 26.0–34.0)
MCHC: 33.5 g/dL (ref 30.0–36.0)
MCV: 90 fL (ref 80.0–100.0)
Platelets: 302 10*3/uL (ref 150–400)
RBC: 3.51 MIL/uL — ABNORMAL LOW (ref 3.87–5.11)
RDW: 12.7 % (ref 11.5–15.5)
WBC: 12.1 10*3/uL — ABNORMAL HIGH (ref 4.0–10.5)
nRBC: 0 % (ref 0.0–0.2)

## 2022-11-24 LAB — BASIC METABOLIC PANEL
Anion gap: 8 (ref 5–15)
BUN: 21 mg/dL — ABNORMAL HIGH (ref 6–20)
CO2: 25 mmol/L (ref 22–32)
Calcium: 8.8 mg/dL — ABNORMAL LOW (ref 8.9–10.3)
Chloride: 103 mmol/L (ref 98–111)
Creatinine, Ser: 1.32 mg/dL — ABNORMAL HIGH (ref 0.44–1.00)
GFR, Estimated: 49 mL/min — ABNORMAL LOW (ref >60)
Glucose, Bld: 107 mg/dL — ABNORMAL HIGH (ref 70–99)
Potassium: 3.4 mmol/L — ABNORMAL LOW (ref 3.5–5.1)
Sodium: 136 mmol/L (ref 135–145)

## 2022-11-24 LAB — HEMOGLOBIN A1C
Hgb A1c MFr Bld: 5.4 % (ref 4.8–5.6)
Mean Plasma Glucose: 108.28 mg/dL

## 2022-11-24 LAB — GLUCOSE, CAPILLARY
Glucose-Capillary: 91 mg/dL (ref 70–99)
Glucose-Capillary: 121 mg/dL — ABNORMAL HIGH (ref 70–99)
Glucose-Capillary: 106 mg/dL — ABNORMAL HIGH (ref 70–99)

## 2022-11-24 LAB — HIV ANTIBODY (ROUTINE TESTING W REFLEX): HIV Screen 4th Generation wRfx: NONREACTIVE

## 2022-11-24 LAB — MAGNESIUM: Magnesium: 1.7 mg/dL (ref 1.7–2.4)

## 2022-11-24 LAB — PROCALCITONIN: Procalcitonin: 0.63 ng/mL

## 2022-11-24 LAB — PHOSPHORUS: Phosphorus: 2.7 mg/dL (ref 2.5–4.6)

## 2022-11-24 MED ORDER — POTASSIUM CHLORIDE CRYS ER 20 MEQ PO TBCR
40.0000 meq | EXTENDED_RELEASE_TABLET | Freq: Once | ORAL | Status: AC
  Administered 2022-11-24: 40 meq via ORAL
  Filled 2022-11-24: qty 2

## 2022-11-24 MED ORDER — POTASSIUM CHLORIDE 2 MEQ/ML IV SOLN
Freq: Once | INTRAVENOUS | Status: DC
  Filled 2022-11-24: qty 1000

## 2022-11-24 MED ORDER — LACTATED RINGERS IV SOLN: INTRAVENOUS | Status: DC

## 2022-11-24 NOTE — Progress Notes (Signed)
  Transition of Care [facility]) Screening Note   Patient Details  Name: THRESIA LANZA Date of Birth: 10-Sep-1971   Transition of Care [facility]) CM/SW Contact:    Henrietta Dine, RN Phone Number: 11/24/2022, 10:41 AM    Transition of Care Department [facility]) has reviewed patient and no TOC needs have been identified at this time. We will continue to monitor patient advancement through interdisciplinary progression rounds. If new patient transition needs arise, please place a TOC consult.

## 2022-11-24 NOTE — Progress Notes (Signed)
PROGRESS NOTE    Jane Montes  [id number] DOB: [birth year] DOA: 11/23/2022 PCP: Debbrah Alar, NP   Brief Narrative: Jane Montes is a 52 y.o. female with a history of hypertension, hyperlipidemia, palpitations. She presented secondary to right flank pain, headache, syncope and found to have evidence of sepsis secondary to acute pyelonephritis. Blood and urine cultures obtained on admission. Ceftriaxone IV started empirically.   Assessment and Plan:  Acute pyelonephritis Patient with evidence of UTI. CT imaging consistent with right sided pyelonephritis; no mention of abscess. Patient started empirically on Ceftriaxone IV. Urine cultures obtained. -Continue Ceftriaxone 2 g IV daily -Follow-up urine cultures -Trend fever curve  Sepsis Present on admission. Secondary to acute pyelonephritis. Blood and urine cultures obtained on admission. Leukocytosis of 20,100 significantly improved to 12,100 today. -Follow-up culture data  Hypovolemia Secondary to poor oral intake. Patient received IV fluid resuscitation. Diet advanced. -Continue LR IV fluids  Syncopal episode Secondary to hypovolemia. Management with IV fluids.  Hypokalemia Mild. Potassium of 3.1 on admission. Potassium supplementation given. Potassium improved to 3.4 today. -BMP in AM  Hyponatremia Secondary to poor oral intake. Sodium of 130 on admission. Resolved with IV fluids.  Diabetes mellitus type 2 History of diabetes with mildly elevated hemoglobin A1C of 6.7 about one year prior. Hemoglobin significantly improved and is 5.4%. -Carb modified diet  Hyperlipidemia -Continue Lipitor  CKD stage IIIa Baseline creatinine of 1.2. Creatinine stable.  Normocytic anemia Appears to be acute and likely related to acute illness. No evidence of bleeding at this time. -CBC in AM  Headache Likely related to fever/malaise. -Tylenol PRN  Right upper lobe pulmonary nodule Patient informed. Recommendation  for repeat CT chest in 3 months and pulmonology follow-up.  Hepatic steatosis Noted on CT imaging. Patient will need continued outpatient follow-up.  Obesity Estimated body mass index is 33.47 kg/m as calculated from the following:   Height as of this encounter: 5' 2"$  (1.575 m).   Weight as of this encounter: 83 kg.  DVT prophylaxis: Lovenox Code Status:   Code Status: Full Code Family Communication: Daughter at bedside Disposition Plan: Discharge home likely in 2-3 days pending ability to transition to oral antibiotics   Consultants:  None  Procedures:  None  Antimicrobials: Ceftriaxone IV    Subjective: Patient reports feeling a bit better until her fever this afternoon. Now her headache has returned. She reports some minimal urine output.  Objective: BP 106/62 (BP Location: Left Arm)   Pulse (!) 110   Temp (!) 101.2 F (38.4 C) (Oral)   Resp 18   Ht 5' 2"$  (1.575 m)   Wt 83 kg   SpO2 100%   BMI 33.47 kg/m   Examination:  General exam: Appears calm and in no distress Respiratory system: Clear to auscultation. Respiratory effort normal. Cardiovascular system: S1 & S2 heard, RRR. 2/6 systolic murmur Gastrointestinal system: Abdomen is nondistended, soft and nontender. Normal bowel sounds heard. Central nervous system: Alert and oriented. No focal neurological deficits. Musculoskeletal: No edema. No calf tenderness Psychiatry: Judgement and insight appear normal. Mood & affect appropriate.    Data Reviewed: I have personally reviewed following labs and imaging studies  CBC Lab Results  Component Value Date   WBC 12.1 (H) 11/24/2022   RBC 3.51 (L) 11/24/2022   HGB 10.6 (L) 11/24/2022   HCT 31.6 (L) 11/24/2022   MCV 90.0 11/24/2022   MCH 30.2 11/24/2022   PLT 302 11/24/2022   MCHC 33.5 11/24/2022   RDW 12.7  11/24/2022   LYMPHSABS 2.2 09/21/2020   MONOABS 0.7 09/21/2020   EOSABS 0.2 09/21/2020   BASOSABS 0.1 [id number]     Last metabolic panel Lab  Results  Component Value Date   NA 136 11/24/2022   K 3.4 (L) 11/24/2022   CL 103 11/24/2022   CO2 25 11/24/2022   BUN 21 (H) 11/24/2022   CREATININE 1.32 (H) 11/24/2022   GLUCOSE 107 (H) 11/24/2022   GFRNONAA 49 (L) 11/24/2022   GFRAA 57 (L) 05/02/2020   CALCIUM 8.8 (L) 11/24/2022   PHOS 2.7 11/24/2022   PROT 6.3 12/30/2021   ALBUMIN 4.1 12/30/2021   BILITOT 0.4 12/30/2021   ALKPHOS 49 12/30/2021   AST 21 12/30/2021   ALT 23 12/30/2021   ANIONGAP 8 11/24/2022    GFR: Estimated Creatinine Clearance: 50.4 mL/min (A) (by C-G formula based on SCr of 1.32 mg/dL (H)).  Recent Results (from the past 240 hour(s))  Resp panel by RT-PCR (RSV, Flu A&B, Covid) Anterior Nasal Swab     Status: None   Collection Time: 11/23/22  2:57 PM   Specimen: Anterior Nasal Swab  Result Value Ref Range Status   SARS Coronavirus 2 by RT PCR NEGATIVE NEGATIVE Final    Comment: (NOTE) SARS-CoV-2 target nucleic acids are NOT DETECTED.  The SARS-CoV-2 RNA is generally detectable in upper respiratory specimens during the acute phase of infection. The lowest concentration of SARS-CoV-2 viral copies this assay can detect is 138 copies/mL. A negative result does not preclude SARS-Cov-2 infection and should not be used as the sole basis for treatment or other patient management decisions. A negative result may occur with  improper specimen collection/handling, submission of specimen other than nasopharyngeal swab, presence of viral mutation(s) within the areas targeted by this assay, and inadequate number of viral copies(<138 copies/mL). A negative result must be combined with clinical observations, patient history, and epidemiological information. The expected result is Negative.  Fact Sheet for Patients:  EntrepreneurPulse.com.au  Fact Sheet for Healthcare Providers:  IncredibleEmployment.be  This test is no t yet approved or cleared by the [place] FDA and   has been authorized for detection and/or diagnosis of SARS-CoV-2 by FDA under an Emergency Use Authorization (EUA). This EUA will remain  in effect (meaning this test can be used) for the duration of the COVID-19 declaration under Section 564(b)(1) of the Act, 21 U.S.C.section 360bbb-3(b)(1), unless the authorization is terminated  or revoked sooner.       Influenza A by PCR NEGATIVE NEGATIVE Final   Influenza B by PCR NEGATIVE NEGATIVE Final    Comment: (NOTE) The Xpert Xpress SARS-CoV-2/FLU/RSV plus assay is intended as an aid in the diagnosis of influenza from Nasopharyngeal swab specimens and should not be used as a sole basis for treatment. Nasal washings and aspirates are unacceptable for Xpert Xpress SARS-CoV-2/FLU/RSV testing.  Fact Sheet for Patients: EntrepreneurPulse.com.au  Fact Sheet for Healthcare Providers: IncredibleEmployment.be  This test is not yet approved or cleared by the [place] FDA and has been authorized for detection and/or diagnosis of SARS-CoV-2 by FDA under an Emergency Use Authorization (EUA). This EUA will remain in effect (meaning this test can be used) for the duration of the COVID-19 declaration under Section 564(b)(1) of the Act, 21 U.S.C. section 360bbb-3(b)(1), unless the authorization is terminated or revoked.     Resp Syncytial Virus by PCR NEGATIVE NEGATIVE Final    Comment: (NOTE) Fact Sheet for Patients: EntrepreneurPulse.com.au  Fact Sheet for Healthcare Providers: IncredibleEmployment.be  This test  is not yet approved or cleared by the [place] and has been authorized for detection and/or diagnosis of SARS-CoV-2 by FDA under an Emergency Use Authorization (EUA). This EUA will remain in effect (meaning this test can be used) for the duration of the COVID-19 declaration under Section 564(b)(1) of the Act, 21 U.S.C. section 360bbb-3(b)(1),  unless the authorization is terminated or revoked.  Performed at [facility], [address] [place], [place] [address]   Culture, blood (routine x 2)     Status: None (Preliminary result)   Collection Time: 11/23/22  4:25 PM   Specimen: BLOOD  Result Value Ref Range Status   Specimen Description   Final    BLOOD RIGHT ANTECUBITAL Performed at [facility], [address] [place], [place] [address]    Special Requests   Final    BOTTLES DRAWN AEROBIC AND ANAEROBIC Blood Culture adequate volume Performed at [facility], [address] [place], [place] [address]    Culture   Final    NO GROWTH < 24 HOURS Performed at [facility], [place] [address] [place], [place] [address]    Report Status PENDING  Incomplete  Culture, blood (routine x 2)     Status: None (Preliminary result)   Collection Time: 11/23/22  4:38 PM   Specimen: BLOOD  Result Value Ref Range Status   Specimen Description   Final    BLOOD LEFT ANTECUBITAL Performed at [facility], [address] [place],  [address]    Special Requests   Final    BOTTLES DRAWN AEROBIC AND ANAEROBIC Blood Culture adequate volume Performed at [facility], [address] [place], [place] [address]    Culture   Final    NO GROWTH < 24 HOURS Performed at [facility], [place] [address] [place],  [address]    Report Status PENDING  Incomplete      Radiology Studies: CT CHEST ABDOMEN PELVIS W CONTRAST  Result Date: 11/23/2022 CLINICAL DATA:  Sepsis. EXAM: CT CHEST, ABDOMEN, AND PELVIS WITH CONTRAST TECHNIQUE: Multidetector CT imaging of the chest, abdomen and pelvis was performed following the standard protocol during bolus administration of intravenous contrast. RADIATION DOSE REDUCTION: This exam was performed according to the departmental dose-optimization program which includes automated exposure control, adjustment of the mA and/or kV according  to patient size and/or use of iterative reconstruction technique. CONTRAST:  16m OMNIPAQUE IOHEXOL 300 MG/ML  SOLN COMPARISON:  September 09, 2020.  May 02, 2020. FINDINGS: CT CHEST FINDINGS Cardiovascular: No significant vascular findings. Normal heart size. No pericardial effusion. Mediastinum/Nodes: No enlarged mediastinal, hilar, or axillary lymph nodes. Thyroid gland, trachea, and esophagus demonstrate no significant findings. Lungs/Pleura: No pneumothorax or pleural effusion is noted. 11 x 7 mm subpleural density is noted posteriorly in the right upper lobe best seen on image number 44 of series 4. May represent focal atelectasis or inflammation, but mass cannot be excluded. Musculoskeletal: No chest wall mass or suspicious bone lesions identified. CT ABDOMEN PELVIS FINDINGS Hepatobiliary: Hepatic steatosis. Status post cholecystectomy. No biliary dilatation is noted. Pancreas: Unremarkable. No pancreatic ductal dilatation or surrounding inflammatory changes. Spleen: Normal in size without focal abnormality. Adrenals/Urinary Tract: Stable right adrenal adenoma. Left adrenal gland is unremarkable. Left kidney is unremarkable. However, patchy low densities are noted involving the right kidney most apparent on delayed post-contrast images, most consistent with pyelonephritis. No hydronephrosis or renal  obstruction is noted. Urinary bladder is unremarkable. Stomach/Bowel: Stomach is within normal limits. Appendix appears normal. No evidence of bowel wall thickening, distention, or inflammatory changes. Vascular/Lymphatic: No significant vascular findings are present. No enlarged abdominal or pelvic lymph nodes. Reproductive: Status post hysterectomy. No adnexal masses. Other: No abdominal wall hernia or abnormality. No abdominopelvic ascites. Musculoskeletal: No acute or significant osseous findings. IMPRESSION: Findings consistent with right pyelonephritis. No definite abscess is noted. Hepatic steatosis. 11 x 7  mm subpleural density is noted medially in the right upper lobe which may simply represent atelectasis or focal inflammation, but neoplasm cannot be excluded. Consider one of the following in 3 months for both low-risk and high-risk individuals: (a) repeat chest CT, (b) follow-up PET-CT, or (c) tissue sampling. This recommendation follows the consensus statement: Guidelines for Management of Incidental Pulmonary Nodules Detected on CT Images: From the Fleischner Society 2017; Radiology 2017; 284:228-243. Stable right adrenal adenoma. Electronically Signed   By: Marijo Conception M.D.   On: 11/23/2022 16:28   CT Head Wo Contrast  Result Date: 11/23/2022 CLINICAL DATA:  Headache, new onset. EXAM: CT HEAD WITHOUT CONTRAST TECHNIQUE: Contiguous axial images were obtained from the base of the skull through the vertex without intravenous contrast. RADIATION DOSE REDUCTION: This exam was performed according to the departmental dose-optimization program which includes automated exposure control, adjustment of the mA and/or kV according to patient size and/or use of iterative reconstruction technique. COMPARISON:  CT head dated January 07, 2013 FINDINGS: Brain: No evidence of acute infarction, hemorrhage, hydrocephalus, extra-axial collection or mass lesion/mass effect. Vascular: No hyperdense vessel or unexpected calcification. Skull: Normal. Negative for fracture or focal lesion. Sinuses/Orbits: No acute finding. Other: None. IMPRESSION: No acute intracranial pathology. Electronically Signed   By: Keane Police D.O.   On: 11/23/2022 16:21      LOS: 0 days    Cordelia Poche, MD [facility] 11/24/2022, 2:10 PM   If 7PM-7AM, please contact night-coverage [email or web address]

## 2022-11-24 NOTE — Progress Notes (Signed)
   11/24/22 1303  Assess: MEWS Score  Temp (!) 101.2 F (38.4 C)  BP 106/62  MAP (mmHg) 74  Pulse Rate (!) 110  Resp 18  SpO2 100 %  Assess: MEWS Score  MEWS Temp 1  MEWS Systolic 0  MEWS Pulse 1  MEWS RR 0  MEWS LOC 0  MEWS Score 2  MEWS Score Color Yellow  Assess: if the MEWS score is Yellow or Red  Were vital signs taken at a resting state? Yes  Focused Assessment Change from prior assessment (see assessment flowsheet)  Does the patient meet 2 or more of the SIRS criteria? Yes  Does the patient have a confirmed or suspected source of infection? Yes  Provider and Rapid Response Notified? No  MEWS guidelines implemented  Yes, yellow  Treat  MEWS Interventions Considered administering scheduled or prn medications/treatments as ordered  Take Vital Signs  Increase Vital Sign Frequency  Yellow: Q2hr x1, continue Q4hrs until patient remains green for 12hrs  Escalate  MEWS: Escalate Yellow: Discuss with charge nurse and consider notifying provider and/or RRT  Notify: Charge Nurse/RN  Name of Charge Nurse/RN Notified Cristy, RN  Provider Notification  Provider Name/Title Dr. Teryl Lucy  Date Provider Notified 11/24/22  Time Provider Notified [id number]  Method of Notification Face-to-face  Notification Reason Change in status  Provider response At bedside  Date of Provider Response 11/24/22  Time of Provider Response 1315  Assess: SIRS CRITERIA  SIRS Temperature  1  SIRS Pulse 1  SIRS Respirations  0  SIRS WBC 1  SIRS Score Sum  3   Pt noted with yellow mews.  Dr. Teryl Lucy aware and is at bedside.  PRN tylenol given.  Pt fluid rate increased.  Will implement yellow mews protocol.

## 2022-11-24 NOTE — Hospital Course (Signed)
Jane Montes is a 52 y.o. female with a history of hypertension, hyperlipidemia, palpitations. She presented secondary to right flank pain, headache, syncope and found to have evidence of sepsis secondary to acute pyelonephritis. Blood and urine cultures obtained on admission. Ceftriaxone IV started empirically.

## 2022-11-25 DIAGNOSIS — N10 Acute tubulo-interstitial nephritis: Secondary | ICD-10-CM | POA: Diagnosis not present

## 2022-11-25 DIAGNOSIS — I10 Essential (primary) hypertension: Secondary | ICD-10-CM | POA: Diagnosis not present

## 2022-11-25 DIAGNOSIS — E861 Hypovolemia: Secondary | ICD-10-CM

## 2022-11-25 DIAGNOSIS — E119 Type 2 diabetes mellitus without complications: Secondary | ICD-10-CM | POA: Diagnosis not present

## 2022-11-25 DIAGNOSIS — E871 Hypo-osmolality and hyponatremia: Secondary | ICD-10-CM | POA: Diagnosis not present

## 2022-11-25 DIAGNOSIS — E669 Obesity, unspecified: Secondary | ICD-10-CM

## 2022-11-25 LAB — CBC
HCT: 29.6 % — ABNORMAL LOW (ref 36.0–46.0)
Hemoglobin: 9.8 g/dL — ABNORMAL LOW (ref 12.0–15.0)
MCH: 29.6 pg (ref 26.0–34.0)
MCHC: 33.1 g/dL (ref 30.0–36.0)
MCV: 89.4 fL (ref 80.0–100.0)
Platelets: 270 10*3/uL (ref 150–400)
RBC: 3.31 MIL/uL — ABNORMAL LOW (ref 3.87–5.11)
RDW: 12.6 % (ref 11.5–15.5)
WBC: 6.3 10*3/uL (ref 4.0–10.5)
nRBC: 0 % (ref 0.0–0.2)

## 2022-11-25 LAB — BASIC METABOLIC PANEL
Anion gap: 6 (ref 5–15)
BUN: 20 mg/dL (ref 6–20)
CO2: 26 mmol/L (ref 22–32)
Calcium: 8.9 mg/dL (ref 8.9–10.3)
Chloride: 108 mmol/L (ref 98–111)
Creatinine, Ser: 1.15 mg/dL — ABNORMAL HIGH (ref 0.44–1.00)
GFR, Estimated: 58 mL/min — ABNORMAL LOW (ref >60)
Glucose, Bld: 92 mg/dL (ref 70–99)
Potassium: 4.2 mmol/L (ref 3.5–5.1)
Sodium: 140 mmol/L (ref 135–145)

## 2022-11-25 LAB — URINE CULTURE: Culture: NO GROWTH

## 2022-11-25 LAB — GLUCOSE, CAPILLARY
Glucose-Capillary: 95 mg/dL (ref 70–99)
Glucose-Capillary: 101 mg/dL — ABNORMAL HIGH (ref 70–99)
Glucose-Capillary: 83 mg/dL (ref 70–99)
Glucose-Capillary: 96 mg/dL (ref 70–99)

## 2022-11-25 LAB — PROCALCITONIN: Procalcitonin: 0.41 ng/mL

## 2022-11-25 NOTE — Progress Notes (Signed)
PROGRESS NOTE    Jane Montes  [id number] DOB: October 06, 1971 DOA: 11/23/2022 PCP: Debbrah Alar, NP   Brief Narrative: Jane Montes is a 52 y.o. female with a history of hypertension, hyperlipidemia, palpitations. She presented secondary to right flank pain, headache, syncope and found to have evidence of sepsis secondary to acute pyelonephritis. Blood and urine cultures obtained on admission. Ceftriaxone IV started empirically.   Assessment and Plan:  Acute pyelonephritis Patient with evidence of UTI. CT imaging consistent with right sided pyelonephritis; no mention of abscess. Patient started empirically on Ceftriaxone IV. Urine cultures obtained after administration of antibiotics and are no growth. -Continue Ceftriaxone 2 g IV daily -Trend fever curve  Sepsis Present on admission. Secondary to acute pyelonephritis. Blood and urine cultures obtained on admission. Leukocytosis of 20,100 significantly improved to 6,300 today. Urine and blood cultures with no growth. Urine culture obtained after administration of antibiotics.  Hypovolemia Secondary to poor oral intake. Patient received IV fluid resuscitation. Diet advanced. -Continue LR IV fluids  Syncopal episode Secondary to hypovolemia. Management with IV fluids.  Hypokalemia Mild. Potassium of 3.1 on admission. Potassium supplementation given. Potassium improved to 4.2 today.  Hyponatremia Secondary to poor oral intake. Sodium of 130 on admission. Resolved with IV fluids.  Diabetes mellitus type 2 History of diabetes with mildly elevated hemoglobin A1C of 6.7 about one year prior. Hemoglobin significantly improved and is 5.4%. -Carb modified diet  Hyperlipidemia -Continue Lipitor  CKD stage IIIa Baseline creatinine of 1.2. Creatinine stable.  Normocytic anemia Appears to be acute and likely related to acute illness. No evidence of bleeding at this time. Continued drift. -CBC in AM  Headache Likely  related to fever/malaise. -Tylenol PRN  Right upper lobe pulmonary nodule Patient informed. Recommendation for repeat CT chest in 3 months and pulmonology follow-up.  Hepatic steatosis Noted on CT imaging. Patient will need continued outpatient follow-up.  Obesity Estimated body mass index is 33.47 kg/m as calculated from the following:   Height as of this encounter: 5' 2"$  (1.575 m).   Weight as of this encounter: 83 kg.  DVT prophylaxis: Lovenox Code Status:   Code Status: Full Code Family Communication: None at bedside Disposition Plan: Discharge home likely in 1 day pending ability to transition to oral antibiotics and stability of hemoglobin   Consultants:  None  Procedures:  None  Antimicrobials: Ceftriaxone IV    Subjective: Patient feeling better today. No issues overnight. Tmax of 101.2 F.  Objective: BP 121/75 (BP Location: Right Arm)   Pulse 73   Temp 98.4 F (36.9 C) (Oral)   Resp 18   Ht 5' 2"$  (1.575 m)   Wt 83 kg   SpO2 100%   BMI 33.47 kg/m   Examination:  General exam: Appears calm and comfortable Respiratory system: Clear to auscultation. Respiratory effort normal. Cardiovascular system: S1 & S2 heard, RRR. Gastrointestinal system: Abdomen is nondistended, soft and nontender. Normal bowel sounds heard. Central nervous system: Alert and oriented. No focal neurological deficits. Musculoskeletal: No edema. No calf tenderness Skin: No cyanosis. No rashes Psychiatry: Judgement and insight appear normal. Mood & affect appropriate.    Data Reviewed: I have personally reviewed following labs and imaging studies  CBC Lab Results  Component Value Date   WBC 6.3 11/25/2022   RBC 3.31 (L) 11/25/2022   HGB 9.8 (L) 11/25/2022   HCT 29.6 (L) 11/25/2022   MCV 89.4 11/25/2022   MCH 29.6 11/25/2022   PLT 270 11/25/2022   MCHC 33.1  11/25/2022   RDW 12.6 11/25/2022   LYMPHSABS 2.2 09/21/2020   MONOABS 0.7 09/21/2020   EOSABS 0.2 09/21/2020    BASOSABS 0.1 123456     Last metabolic panel Lab Results  Component Value Date   NA 140 11/25/2022   K 4.2 11/25/2022   CL 108 11/25/2022   CO2 26 11/25/2022   BUN 20 11/25/2022   CREATININE 1.15 (H) 11/25/2022   GLUCOSE 92 11/25/2022   GFRNONAA 58 (L) 11/25/2022   GFRAA 57 (L) 05/02/2020   CALCIUM 8.9 11/25/2022   PHOS 2.7 11/24/2022   PROT 6.3 12/30/2021   ALBUMIN 4.1 12/30/2021   BILITOT 0.4 12/30/2021   ALKPHOS 49 12/30/2021   AST 21 12/30/2021   ALT 23 12/30/2021   ANIONGAP 6 11/25/2022    GFR: Estimated Creatinine Clearance: 57.8 mL/min (A) (by C-G formula based on SCr of 1.15 mg/dL (H)).  Recent Results (from the past 240 hour(s))  Resp panel by RT-PCR (RSV, Flu A&B, Covid) Anterior Nasal Swab     Status: None   Collection Time: 11/23/22  2:57 PM   Specimen: Anterior Nasal Swab  Result Value Ref Range Status   SARS Coronavirus 2 by RT PCR NEGATIVE NEGATIVE Final    Comment: (NOTE) SARS-CoV-2 target nucleic acids are NOT DETECTED.  The SARS-CoV-2 RNA is generally detectable in upper respiratory specimens during the acute phase of infection. The lowest concentration of SARS-CoV-2 viral copies this assay can detect is 138 copies/mL. A negative result does not preclude SARS-Cov-2 infection and should not be used as the sole basis for treatment or other patient management decisions. A negative result may occur with  improper specimen collection/handling, submission of specimen other than nasopharyngeal swab, presence of viral mutation(s) within the areas targeted by this assay, and inadequate number of viral copies(<138 copies/mL). A negative result must be combined with clinical observations, patient history, and epidemiological information. The expected result is Negative.  Fact Sheet for Patients:  EntrepreneurPulse.com.au  Fact Sheet for Healthcare Providers:  IncredibleEmployment.be  This test is no t yet approved  or cleared by the [place] FDA and  has been authorized for detection and/or diagnosis of SARS-CoV-2 by FDA under an Emergency Use Authorization (EUA). This EUA will remain  in effect (meaning this test can be used) for the duration of the COVID-19 declaration under Section 564(b)(1) of the Act, 21 U.S.C.section 360bbb-3(b)(1), unless the authorization is terminated  or revoked sooner.       Influenza A by PCR NEGATIVE NEGATIVE Final   Influenza B by PCR NEGATIVE NEGATIVE Final    Comment: (NOTE) The Xpert Xpress SARS-CoV-2/FLU/RSV plus assay is intended as an aid in the diagnosis of influenza from Nasopharyngeal swab specimens and should not be used as a sole basis for treatment. Nasal washings and aspirates are unacceptable for Xpert Xpress SARS-CoV-2/FLU/RSV testing.  Fact Sheet for Patients: EntrepreneurPulse.com.au  Fact Sheet for Healthcare Providers: IncredibleEmployment.be  This test is not yet approved or cleared by the [place] FDA and has been authorized for detection and/or diagnosis of SARS-CoV-2 by FDA under an Emergency Use Authorization (EUA). This EUA will remain in effect (meaning this test can be used) for the duration of the COVID-19 declaration under Section 564(b)(1) of the Act, 21 U.S.C. section 360bbb-3(b)(1), unless the authorization is terminated or revoked.     Resp Syncytial Virus by PCR NEGATIVE NEGATIVE Final    Comment: (NOTE) Fact Sheet for Patients: EntrepreneurPulse.com.au  Fact Sheet for Healthcare Providers: IncredibleEmployment.be  This  test is not yet approved or cleared by the [place] and has been authorized for detection and/or diagnosis of SARS-CoV-2 by FDA under an Emergency Use Authorization (EUA). This EUA will remain in effect (meaning this test can be used) for the duration of the COVID-19 declaration under Section 564(b)(1) of the Act,  21 U.S.C. section 360bbb-3(b)(1), unless the authorization is terminated or revoked.  Performed at [facility], [address] [place], [place] [address]   Culture, blood (routine x 2)     Status: None (Preliminary result)   Collection Time: 11/23/22  4:25 PM   Specimen: BLOOD  Result Value Ref Range Status   Specimen Description   Final    BLOOD RIGHT ANTECUBITAL Performed at [facility], [address] [place], [place] [address]    Special Requests   Final    BOTTLES DRAWN AEROBIC AND ANAEROBIC Blood Culture adequate volume Performed at [facility], [address] [place], [place] [address]    Culture   Final    NO GROWTH 2 DAYS Performed at [facility], [place] [address] [place], [place] [address]    Report Status PENDING  Incomplete  Culture, blood (routine x 2)     Status: None (Preliminary result)   Collection Time: 11/23/22  4:38 PM   Specimen: BLOOD  Result Value Ref Range Status   Specimen Description   Final    BLOOD LEFT ANTECUBITAL Performed at [facility], [address] [place], [place] [address]    Special Requests   Final    BOTTLES DRAWN AEROBIC AND ANAEROBIC Blood Culture adequate volume Performed at [facility], [address] [place], [place] [address]    Culture   Final    NO GROWTH 2 DAYS Performed at [facility], [place] [address], [place], [place] [address]    Report Status PENDING  Incomplete  Urine Culture (for pregnant, neutropenic or urologic patients or patients with an indwelling urinary catheter)     Status: None   Collection Time: 11/23/22 11:05 PM   Specimen: Urine, Clean Catch  Result Value Ref Range Status   Specimen Description   Final    URINE, CLEAN CATCH Performed at [facility], [place] [address], [place], [place] [address]    Special Requests   Final    NONE Performed at [facility], [place] [address], [place],  [place] [address]    Culture   Final    NO GROWTH Performed at [facility], [place] [address], [place], [place] [address]    Report Status 11/25/2022 FINAL  Final      Radiology Studies: CT CHEST ABDOMEN PELVIS W CONTRAST  Result Date: 11/23/2022 CLINICAL DATA:  Sepsis. EXAM: CT CHEST, ABDOMEN, AND PELVIS WITH CONTRAST TECHNIQUE: Multidetector CT imaging of the chest, abdomen and pelvis was performed following the standard protocol during bolus administration of intravenous contrast. RADIATION DOSE REDUCTION: This exam was performed according to the departmental dose-optimization program which includes automated exposure control, adjustment of the mA and/or kV according to patient size and/or use of iterative reconstruction technique. CONTRAST:  19m OMNIPAQUE IOHEXOL 300 MG/ML  SOLN COMPARISON:  September 09, 2020.  May 02, 2020. FINDINGS: CT CHEST FINDINGS Cardiovascular: No significant vascular findings. Normal heart size. No pericardial effusion. Mediastinum/Nodes: No enlarged mediastinal, hilar, or axillary lymph nodes. Thyroid gland, trachea, and esophagus demonstrate no  significant findings. Lungs/Pleura: No pneumothorax or pleural effusion is noted. 11 x 7 mm subpleural density is noted posteriorly in the right upper lobe best seen on image number 44 of series 4. May represent focal atelectasis or inflammation, but mass cannot be excluded. Musculoskeletal: No chest wall mass or suspicious bone lesions identified. CT ABDOMEN PELVIS FINDINGS Hepatobiliary: Hepatic steatosis. Status post cholecystectomy. No biliary dilatation is noted. Pancreas: Unremarkable. No pancreatic ductal dilatation or surrounding inflammatory changes. Spleen: Normal in size without focal abnormality. Adrenals/Urinary Tract: Stable right adrenal adenoma. Left adrenal gland is unremarkable. Left kidney is unremarkable. However, patchy low densities are noted involving the right kidney most apparent on delayed post-contrast  images, most consistent with pyelonephritis. No hydronephrosis or renal obstruction is noted. Urinary bladder is unremarkable. Stomach/Bowel: Stomach is within normal limits. Appendix appears normal. No evidence of bowel wall thickening, distention, or inflammatory changes. Vascular/Lymphatic: No significant vascular findings are present. No enlarged abdominal or pelvic lymph nodes. Reproductive: Status post hysterectomy. No adnexal masses. Other: No abdominal wall hernia or abnormality. No abdominopelvic ascites. Musculoskeletal: No acute or significant osseous findings. IMPRESSION: Findings consistent with right pyelonephritis. No definite abscess is noted. Hepatic steatosis. 11 x 7 mm subpleural density is noted medially in the right upper lobe which may simply represent atelectasis or focal inflammation, but neoplasm cannot be excluded. Consider one of the following in 3 months for both low-risk and high-risk individuals: (a) repeat chest CT, (b) follow-up PET-CT, or (c) tissue sampling. This recommendation follows the consensus statement: Guidelines for Management of Incidental Pulmonary Nodules Detected on CT Images: From the Fleischner Society 2017; Radiology 2017; 284:228-243. Stable right adrenal adenoma. Electronically Signed   By: Marijo Conception M.D.   On: 11/23/2022 16:28   CT Head Wo Contrast  Result Date: 11/23/2022 CLINICAL DATA:  Headache, new onset. EXAM: CT HEAD WITHOUT CONTRAST TECHNIQUE: Contiguous axial images were obtained from the base of the skull through the vertex without intravenous contrast. RADIATION DOSE REDUCTION: This exam was performed according to the departmental dose-optimization program which includes automated exposure control, adjustment of the mA and/or kV according to patient size and/or use of iterative reconstruction technique. COMPARISON:  CT head dated January 07, 2013 FINDINGS: Brain: No evidence of acute infarction, hemorrhage, hydrocephalus, extra-axial collection or  mass lesion/mass effect. Vascular: No hyperdense vessel or unexpected calcification. Skull: Normal. Negative for fracture or focal lesion. Sinuses/Orbits: No acute finding. Other: None. IMPRESSION: No acute intracranial pathology. Electronically Signed   By: Keane Police D.O.   On: 11/23/2022 16:21      LOS: 1 day    Cordelia Poche, MD [facility] 11/25/2022, 2:21 PM   If 7PM-7AM, please contact night-coverage [email or web address]

## 2022-11-26 DIAGNOSIS — N10 Acute tubulo-interstitial nephritis: Secondary | ICD-10-CM | POA: Diagnosis not present

## 2022-11-26 LAB — PROCALCITONIN: Procalcitonin: 0.24 ng/mL

## 2022-11-26 LAB — CBC
HCT: 31.3 % — ABNORMAL LOW (ref 36.0–46.0)
Hemoglobin: 10.2 g/dL — ABNORMAL LOW (ref 12.0–15.0)
MCH: 29.3 pg (ref 26.0–34.0)
MCHC: 32.6 g/dL (ref 30.0–36.0)
MCV: 89.9 fL (ref 80.0–100.0)
Platelets: 301 10*3/uL (ref 150–400)
RBC: 3.48 MIL/uL — ABNORMAL LOW (ref 3.87–5.11)
RDW: 12.8 % (ref 11.5–15.5)
WBC: 5.9 10*3/uL (ref 4.0–10.5)
nRBC: 0 % (ref 0.0–0.2)

## 2022-11-26 LAB — GLUCOSE, CAPILLARY: Glucose-Capillary: 88 mg/dL (ref 70–99)

## 2022-11-26 MED ORDER — CEFPODOXIME PROXETIL 200 MG PO TABS: 200.0000 mg | ORAL_TABLET | Freq: Two times a day (BID) | ORAL | 0 refills | Status: AC

## 2022-11-26 NOTE — Discharge Instructions (Signed)
[place],  You were in the hospital because of Pyelonephritis (kidney infection). This was treated with IV antibiotics and you have significantly improved. You are being transitioned to oral antibiotics to continue treatment. We discussed your allergy to similar antibiotics and decided to try this antibiotic. If there are issues, please discuss with your PCP to have a new prescription called in. If unable to reach your PCP, you can also call the [facility] office at [phone].

## 2022-11-26 NOTE — Discharge Summary (Signed)
Physician Discharge Summary   Patient: Jane Montes MRN: [id number] DOB: 1971/01/31  Admit date:     11/23/2022  Discharge date: 11/26/22  Discharge Physician: Cordelia Poche, MD   PCP: Debbrah Alar, NP   Recommendations at discharge:  Hospital follow-up with PCP Pulmonology follow-up for lung nodule  Discharge Diagnoses: Principal Problem:   Acute pyelonephritis Active Problems:   Hypertension   Fatty liver   Controlled type 2 diabetes mellitus without complication, without long-term current use of insulin (HCC)   Obesity (BMI 30.0-34.9)   Syncope   Hypovolemia   Hyponatremia  Resolved Problems:   * No resolved hospital problems. *  Hospital Course: Jane Montes is a 52 y.o. female with a history of hypertension, hyperlipidemia, palpitations. She presented secondary to right flank pain, headache, syncope and found to have evidence of sepsis secondary to acute pyelonephritis. Blood and urine cultures obtained on admission. Ceftriaxone IV started empirically.  Assessment and Plan:  Acute pyelonephritis Patient with evidence of UTI. CT imaging consistent with right sided pyelonephritis; no mention of abscess. Patient started empirically on Ceftriaxone IV. Urine cultures obtained after administration of antibiotics and are no growth. Ceftriaxone transitioned to [facility] on discharge to complete a 10 day total course.   Sepsis Present on admission. Secondary to acute pyelonephritis. Blood and urine cultures obtained on admission. Leukocytosis of 20,100 significantly improved to 5,900 prior to discharge. Urine and blood cultures with no growth. Urine culture obtained after administration of antibiotics.   Hypovolemia Secondary to poor oral intake. Patient received IV fluid resuscitation. Diet advanced. Resolved.   Syncopal episode Secondary to hypovolemia. Management with IV fluids.   Hypokalemia Mild. Potassium of 3.1 on admission. Potassium supplementation given.  Potassium improved to 4.2.   Hyponatremia Secondary to poor oral intake. Sodium of 130 on admission. Resolved with IV fluids.   Diabetes mellitus type 2 History of diabetes with mildly elevated hemoglobin A1C of 6.7 about one year prior. Hemoglobin significantly improved and is 5.4%. Continue carb modified diet.   Hyperlipidemia Continue Lipitor   CKD stage IIIa Baseline creatinine of 1.2. Creatinine stable.   Normocytic anemia Appears to be acute and likely related to acute illness. No evidence of bleeding at this time. Continued drift.   Headache Likely related to fever/malaise.   Right upper lobe pulmonary nodule Patient informed. Recommendation for repeat CT chest in 3 months and pulmonology follow-up.   Hepatic steatosis Noted on CT imaging. Patient will need continued outpatient follow-up.   Obesity Estimated body mass index is 33.47 kg/m as calculated from the following:   Height as of this encounter: 5' 2"$  (1.575 m).   Weight as of this encounter: 83 kg.    Consultants: None Procedures performed: None  Disposition: Home Diet recommendation: Carb modified diet   DISCHARGE MEDICATION: Allergies as of 11/26/2022       Reactions   Cephalexin Hives, Nausea Only, Rash        Medication List     STOP taking these medications    ibuprofen 200 MG tablet Commonly known as: ADVIL       TAKE these medications    acetaminophen 500 MG tablet Commonly known as: TYLENOL Take 1,000 mg by mouth 4 (four) times daily as needed for moderate pain, mild pain or headache.   atorvastatin 20 MG tablet Commonly known as: LIPITOR Take 1 tablet (20 mg total) by mouth daily.   cefpodoxime 200 MG tablet Commonly known as: VANTIN Take 1 tablet (200 mg total) by  mouth 2 (two) times daily for 7 days.   fenofibrate 145 MG tablet Commonly known as: TRICOR Take 1 tablet (145 mg total) by mouth daily.   indapamide 1.25 MG tablet Commonly known as: LOZOL Take 1 tablet  (1.25 mg total) by mouth every morning.   nitroGLYCERIN 0.4 MG SL tablet Commonly known as: NITROSTAT Take 1 tablet, under your tongue, while sitting.  If no relief of pain may repeat NTG, one tab every 5 minutes up to 3 tablets total over 15 minutes.   Wegovy 2.4 MG/0.75ML Soaj Generic drug: Semaglutide-Weight Management Inject 2.4 mg into the skin once a week.        Follow-up Information     Debbrah Alar, NP. Schedule an appointment as soon as possible for a visit in 1 week(s).   Specialty: Internal Medicine Why: For hospital follow-up Contact information: [address] [place] [address] [phone]         Garner Nash, DO. Call today.   Specialty: Pulmonary Disease Why: RUL pulmonary ?nodule/opacity Contact information: [address]  [place] [address] [phone]                Discharge Exam: BP 128/83 (BP Location: Right Arm)   Pulse 73   Temp 98.9 F (37.2 C) (Oral)   Resp 18   Ht 5' 2"$  (1.575 m)   Wt 83 kg   SpO2 99%   BMI 33.47 kg/m   General exam: Appears calm and comfortable Respiratory system: Respiratory effort normal. Gastrointestinal system: Abdomen is non-distended Central nervous system: Alert and oriented. Psychiatry: Judgement and insight appear normal. Mood & affect appropriate.   Condition at discharge: stable  The results of significant diagnostics from this hospitalization (including imaging, microbiology, ancillary and laboratory) are listed below for reference.   Imaging Studies: CT CHEST ABDOMEN PELVIS W CONTRAST  Result Date: 11/23/2022 CLINICAL DATA:  Sepsis. EXAM: CT CHEST, ABDOMEN, AND PELVIS WITH CONTRAST TECHNIQUE: Multidetector CT imaging of the chest, abdomen and pelvis was performed following the standard protocol during bolus administration of intravenous contrast. RADIATION DOSE REDUCTION: This exam was performed according to the departmental dose-optimization  program which includes automated exposure control, adjustment of the mA and/or kV according to patient size and/or use of iterative reconstruction technique. CONTRAST:  134m OMNIPAQUE IOHEXOL 300 MG/ML  SOLN COMPARISON:  September 09, 2020.  May 02, 2020. FINDINGS: CT CHEST FINDINGS Cardiovascular: No significant vascular findings. Normal heart size. No pericardial effusion. Mediastinum/Nodes: No enlarged mediastinal, hilar, or axillary lymph nodes. Thyroid gland, trachea, and esophagus demonstrate no significant findings. Lungs/Pleura: No pneumothorax or pleural effusion is noted. 11 x 7 mm subpleural density is noted posteriorly in the right upper lobe best seen on image number 44 of series 4. May represent focal atelectasis or inflammation, but mass cannot be excluded. Musculoskeletal: No chest wall mass or suspicious bone lesions identified. CT ABDOMEN PELVIS FINDINGS Hepatobiliary: Hepatic steatosis. Status post cholecystectomy. No biliary dilatation is noted. Pancreas: Unremarkable. No pancreatic ductal dilatation or surrounding inflammatory changes. Spleen: Normal in size without focal abnormality. Adrenals/Urinary Tract: Stable right adrenal adenoma. Left adrenal gland is unremarkable. Left kidney is unremarkable. However, patchy low densities are noted involving the right kidney most apparent on delayed post-contrast images, most consistent with pyelonephritis. No hydronephrosis or renal obstruction is noted. Urinary bladder is unremarkable. Stomach/Bowel: Stomach is within normal limits. Appendix appears normal. No evidence of bowel wall thickening, distention, or inflammatory changes. Vascular/Lymphatic: No significant  vascular findings are present. No enlarged abdominal or pelvic lymph nodes. Reproductive: Status post hysterectomy. No adnexal masses. Other: No abdominal wall hernia or abnormality. No abdominopelvic ascites. Musculoskeletal: No acute or significant osseous findings. IMPRESSION: Findings  consistent with right pyelonephritis. No definite abscess is noted. Hepatic steatosis. 11 x 7 mm subpleural density is noted medially in the right upper lobe which may simply represent atelectasis or focal inflammation, but neoplasm cannot be excluded. Consider one of the following in 3 months for both low-risk and high-risk individuals: (a) repeat chest CT, (b) follow-up PET-CT, or (c) tissue sampling. This recommendation follows the consensus statement: Guidelines for Management of Incidental Pulmonary Nodules Detected on CT Images: From the Fleischner Society 2017; Radiology 2017; 284:228-243. Stable right adrenal adenoma. Electronically Signed   By: Marijo Conception M.D.   On: 11/23/2022 16:28   CT Head Wo Contrast  Result Date: 11/23/2022 CLINICAL DATA:  Headache, new onset. EXAM: CT HEAD WITHOUT CONTRAST TECHNIQUE: Contiguous axial images were obtained from the base of the skull through the vertex without intravenous contrast. RADIATION DOSE REDUCTION: This exam was performed according to the departmental dose-optimization program which includes automated exposure control, adjustment of the mA and/or kV according to patient size and/or use of iterative reconstruction technique. COMPARISON:  CT head dated January 07, 2013 FINDINGS: Brain: No evidence of acute infarction, hemorrhage, hydrocephalus, extra-axial collection or mass lesion/mass effect. Vascular: No hyperdense vessel or unexpected calcification. Skull: Normal. Negative for fracture or focal lesion. Sinuses/Orbits: No acute finding. Other: None. IMPRESSION: No acute intracranial pathology. Electronically Signed   By: Keane Police D.O.   On: 11/23/2022 16:21    Microbiology: Results for orders placed or performed during the hospital encounter of 11/23/22  Resp panel by RT-PCR (RSV, Flu A&B, Covid) Anterior Nasal Swab     Status: None   Collection Time: 11/23/22  2:57 PM   Specimen: Anterior Nasal Swab  Result Value Ref Range Status   SARS  Coronavirus 2 by RT PCR NEGATIVE NEGATIVE Final    Comment: (NOTE) SARS-CoV-2 target nucleic acids are NOT DETECTED.  The SARS-CoV-2 RNA is generally detectable in upper respiratory specimens during the acute phase of infection. The lowest concentration of SARS-CoV-2 viral copies this assay can detect is 138 copies/mL. A negative result does not preclude SARS-Cov-2 infection and should not be used as the sole basis for treatment or other patient management decisions. A negative result may occur with  improper specimen collection/handling, submission of specimen other than nasopharyngeal swab, presence of viral mutation(s) within the areas targeted by this assay, and inadequate number of viral copies(<138 copies/mL). A negative result must be combined with clinical observations, patient history, and epidemiological information. The expected result is Negative.  Fact Sheet for Patients:  EntrepreneurPulse.com.au  Fact Sheet for Healthcare Providers:  IncredibleEmployment.be  This test is no t yet approved or cleared by the [place] FDA and  has been authorized for detection and/or diagnosis of SARS-CoV-2 by FDA under an Emergency Use Authorization (EUA). This EUA will remain  in effect (meaning this test can be used) for the duration of the COVID-19 declaration under Section 564(b)(1) of the Act, 21 U.S.C.section 360bbb-3(b)(1), unless the authorization is terminated  or revoked sooner.       Influenza A by PCR NEGATIVE NEGATIVE Final   Influenza B by PCR NEGATIVE NEGATIVE Final    Comment: (NOTE) The Xpert Xpress SARS-CoV-2/FLU/RSV plus assay is intended as an aid in the diagnosis of influenza from Nasopharyngeal  swab specimens and should not be used as a sole basis for treatment. Nasal washings and aspirates are unacceptable for Xpert Xpress SARS-CoV-2/FLU/RSV testing.  Fact Sheet for  Patients: EntrepreneurPulse.com.au  Fact Sheet for Healthcare Providers: IncredibleEmployment.be  This test is not yet approved or cleared by the [place] FDA and has been authorized for detection and/or diagnosis of SARS-CoV-2 by FDA under an Emergency Use Authorization (EUA). This EUA will remain in effect (meaning this test can be used) for the duration of the COVID-19 declaration under Section 564(b)(1) of the Act, 21 U.S.C. section 360bbb-3(b)(1), unless the authorization is terminated or revoked.     Resp Syncytial Virus by PCR NEGATIVE NEGATIVE Final    Comment: (NOTE) Fact Sheet for Patients: EntrepreneurPulse.com.au  Fact Sheet for Healthcare Providers: IncredibleEmployment.be  This test is not yet approved or cleared by the [place] FDA and has been authorized for detection and/or diagnosis of SARS-CoV-2 by FDA under an Emergency Use Authorization (EUA). This EUA will remain in effect (meaning this test can be used) for the duration of the COVID-19 declaration under Section 564(b)(1) of the Act, 21 U.S.C. section 360bbb-3(b)(1), unless the authorization is terminated or revoked.  Performed at [facility], [address] [place], [place] [address]   Culture, blood (routine x 2)     Status: None (Preliminary result)   Collection Time: 11/23/22  4:25 PM   Specimen: BLOOD  Result Value Ref Range Status   Specimen Description   Final    BLOOD RIGHT ANTECUBITAL Performed at [facility], [address] [place], [place] [address]    Special Requests   Final    BOTTLES DRAWN AEROBIC AND ANAEROBIC Blood Culture adequate volume Performed at [facility], [address] [place], [place] [address]    Culture   Final    NO GROWTH 3 DAYS Performed at [facility], [place] [address], [place], [place] [address]    Report Status PENDING  Incomplete   Culture, blood (routine x 2)     Status: None (Preliminary result)   Collection Time: 11/23/22  4:38 PM   Specimen: BLOOD  Result Value Ref Range Status   Specimen Description   Final    BLOOD LEFT ANTECUBITAL Performed at [facility], [address] [place], [place] [address]    Special Requests   Final    BOTTLES DRAWN AEROBIC AND ANAEROBIC Blood Culture adequate volume Performed at [facility], [address] [place], [place] [address]    Culture   Final    NO GROWTH 3 DAYS Performed at [facility], [place] [address], [place], [place] [address]    Report Status PENDING  Incomplete  Urine Culture (for pregnant, neutropenic or urologic patients or patients with an indwelling urinary catheter)     Status: None   Collection Time: 11/23/22 11:05 PM   Specimen: Urine, Clean Catch  Result Value Ref Range Status   Specimen Description   Final    URINE, CLEAN CATCH Performed at [facility], [place] [address], [place], [place] [address]    Special Requests   Final    NONE Performed at [facility], [place] [address], [place], [place] [address]    Culture   Final    NO GROWTH Performed at [facility], [place] [address], [place], [place] [address]    Report Status 11/25/2022 FINAL  Final    Labs: CBC: Recent Labs  Lab 11/23/22 1457 11/24/22 0516 11/25/22 0425 11/26/22 0420  WBC 20.1* 12.1* 6.3 5.9  HGB 11.5* 10.6* 9.8* 10.2*  HCT 33.7* 31.6* 29.6* 31.3*  MCV 86.0 90.0 89.4 89.9  PLT 388 302 270 [id number]   Basic Metabolic Panel: Recent Labs  Lab 11/23/22 1457 11/24/22 0516 11/25/22 0425  NA 130* 136 140  K 3.1* 3.4* 4.2  CL 102 103 108  CO2 20* 25 26  GLUCOSE 181* 107* 92  BUN 24* 21* 20  CREATININE 1.24* 1.32* 1.15*  CALCIUM 8.7* 8.8* 8.9  MG  --  1.7  --   PHOS  --  2.7  --     CBG: Recent Labs  Lab 11/25/22 0736 11/25/22 1120 11/25/22 1740 11/25/22 2108 11/26/22 0734  GLUCAP 95 101* 83  96 88    Discharge time spent: 35 minutes.  Signed: Cordelia Poche, MD [facility] 11/26/2022

## 2022-11-27 ENCOUNTER — Telehealth: Payer: Self-pay

## 2022-11-27 NOTE — Transitions of Care (Post Inpatient/ED Visit) (Signed)
   11/27/2022  Name: MAUD SCARBRO MRN: [id number] DOB: Mar 10, 1971  Today's TOC FU Call Status: Today's TOC FU Call Status:: Successful TOC FU Call Competed TOC FU Call Complete Date: 11/27/22  Transition Care Management Follow-up Telephone Call Date of Discharge: 11/26/22 Discharge Facility: [facility]) Type of Discharge: Inpatient Admission Primary Inpatient Discharge Diagnosis:: nephritis How have you been since you were released from the hospital?: Better Any questions or concerns?: No  Items Reviewed: Did you receive and understand the discharge instructions provided?: Yes Medications obtained and verified?: Yes (Medications Reviewed) Any new allergies since your discharge?: No Dietary orders reviewed?: NA Do you have support at home?: Yes People in Home: child(ren), adult  Home Care and Equipment/Supplies: [place] Ordered?: NA Any new equipment or medical supplies ordered?: NA  Functional Questionnaire: Do you need assistance with bathing/showering or dressing?: No Do you need assistance with meal preparation?: No Do you need assistance with eating?: No Do you have difficulty maintaining continence: No Do you need assistance with getting out of bed/getting out of a chair/moving?: No Do you have difficulty managing or taking your medications?: No  Folllow up appointments reviewed: PCP Follow-up appointment confirmed?: Yes Date of PCP follow-up appointment?: 11/26/22 Follow-up Provider: [facility] Follow-up appointment confirmed?: Yes Date of Specialist follow-up appointment?: 12/11/22 Follow-Up Specialty Provider:: Pulmonary Do you need transportation to your follow-up appointment?: No Do you understand care options if your condition(s) worsen?: Yes-patient verbalized understanding    SIGNATURE Juanda Crumble, [place] Nurse Health Advisor Direct Dial [phone]

## 2022-11-28 LAB — CULTURE, BLOOD (ROUTINE X 2)
Culture: NO GROWTH
Culture: NO GROWTH
Special Requests: ADEQUATE
Special Requests: ADEQUATE

## 2022-12-01 ENCOUNTER — Telehealth: Payer: Self-pay | Admitting: Family

## 2022-12-01 ENCOUNTER — Ambulatory Visit (INDEPENDENT_AMBULATORY_CARE_PROVIDER_SITE_OTHER): Payer: 59 | Admitting: Family

## 2022-12-01 ENCOUNTER — Encounter: Payer: Self-pay | Admitting: Family

## 2022-12-01 VITALS — BP 113/78 | HR 97 | Temp 97.6°F | Resp 16 | Wt 165.0 lb

## 2022-12-01 DIAGNOSIS — F419 Anxiety disorder, unspecified: Secondary | ICD-10-CM

## 2022-12-01 DIAGNOSIS — R55 Syncope and collapse: Secondary | ICD-10-CM | POA: Diagnosis not present

## 2022-12-01 DIAGNOSIS — R911 Solitary pulmonary nodule: Secondary | ICD-10-CM | POA: Diagnosis not present

## 2022-12-01 DIAGNOSIS — I10 Essential (primary) hypertension: Secondary | ICD-10-CM

## 2022-12-01 DIAGNOSIS — D649 Anemia, unspecified: Secondary | ICD-10-CM | POA: Diagnosis not present

## 2022-12-01 DIAGNOSIS — Z7189 Other specified counseling: Secondary | ICD-10-CM | POA: Diagnosis not present

## 2022-12-01 DIAGNOSIS — E119 Type 2 diabetes mellitus without complications: Secondary | ICD-10-CM | POA: Diagnosis not present

## 2022-12-01 DIAGNOSIS — N12 Tubulo-interstitial nephritis, not specified as acute or chronic: Secondary | ICD-10-CM | POA: Diagnosis not present

## 2022-12-01 DIAGNOSIS — K760 Fatty (change of) liver, not elsewhere classified: Secondary | ICD-10-CM

## 2022-12-01 DIAGNOSIS — E871 Hypo-osmolality and hyponatremia: Secondary | ICD-10-CM

## 2022-12-01 LAB — COMPREHENSIVE METABOLIC PANEL
ALT: 59 U/L — ABNORMAL HIGH (ref 0–35)
AST: 31 U/L (ref 0–37)
Albumin: 4.3 g/dL (ref 3.5–5.2)
Alkaline Phosphatase: 104 U/L (ref 39–117)
BUN: 25 mg/dL — ABNORMAL HIGH (ref 6–23)
CO2: 26 mEq/L (ref 19–32)
Calcium: 10.6 mg/dL — ABNORMAL HIGH (ref 8.4–10.5)
Chloride: 102 mEq/L (ref 96–112)
Creatinine, Ser: 1.16 mg/dL (ref 0.40–1.20)
GFR: 54.56 mL/min — ABNORMAL LOW (ref >60.00)
Glucose, Bld: 108 mg/dL — ABNORMAL HIGH (ref 70–99)
Potassium: 4.3 mEq/L (ref 3.5–5.1)
Sodium: 139 mEq/L (ref 135–145)
Total Bilirubin: 0.4 mg/dL (ref 0.2–1.2)
Total Protein: 7.2 g/dL (ref 6.0–8.3)

## 2022-12-01 LAB — CBC WITH DIFFERENTIAL/PLATELET
Basophils Absolute: 0.1 10*3/uL (ref 0.0–0.1)
Basophils Relative: 0.7 % (ref 0.0–3.0)
Eosinophils Absolute: 0.3 10*3/uL (ref 0.0–0.7)
Eosinophils Relative: 2.6 % (ref 0.0–5.0)
HCT: 37.3 % (ref 36.0–46.0)
Hemoglobin: 12.9 g/dL (ref 12.0–15.0)
Lymphocytes Relative: 24.7 % (ref 12.0–46.0)
Lymphs Abs: 2.7 10*3/uL (ref 0.7–4.0)
MCHC: 34.5 g/dL (ref 30.0–36.0)
MCV: 87.9 fl (ref 78.0–100.0)
Monocytes Absolute: 0.6 10*3/uL (ref 0.1–1.0)
Monocytes Relative: 6 % (ref 3.0–12.0)
Neutro Abs: 7.1 10*3/uL (ref 1.4–7.7)
Neutrophils Relative %: 66 % (ref 43.0–77.0)
Platelets: 674 10*3/uL — ABNORMAL HIGH (ref 150.0–400.0)
RBC: 4.24 Mil/uL (ref 3.87–5.11)
RDW: 13.3 % (ref 11.5–15.5)
WBC: 10.7 10*3/uL — ABNORMAL HIGH (ref 4.0–10.5)

## 2022-12-01 NOTE — Assessment & Plan Note (Deleted)
Lab Results  Component Value Date   WBC 5.9 11/26/2022   HGB 10.2 (L) 11/26/2022   HCT 31.3 (L) 11/26/2022   MCV 89.9 11/26/2022   PLT 301 11/26/2022   Noted during hospitalization. Will check follow up CBC and iron studies.

## 2022-12-01 NOTE — Assessment & Plan Note (Signed)
Should continue to improve with weight loss efforts.

## 2022-12-01 NOTE — Telephone Encounter (Signed)
Records release faxed 

## 2022-12-01 NOTE — Telephone Encounter (Signed)
Please call [facility] in [facility] to request last DM eye report.

## 2022-12-01 NOTE — Assessment & Plan Note (Signed)
Resolved. Was likely secondary to sepsis.

## 2022-12-01 NOTE — Progress Notes (Addendum)
Subjective:   By signing my name below, I, Jane Montes, attest that this documentation has been prepared under the direction and in the presence of Nance Pear, NP 12/01/22   Patient ID: Jane Montes, female    DOB: November 21, 1970, 52 y.o.   MRN: [id number]  Chief Complaint  Patient presents with   Hospitalization Follow-up    HPI Patient is in today for a follow up.   Sepsis/Pyelonephritis: In the days leading up to her ED visit she reports headaches and a fever of 103 that she was unable to break. She had several syncopal episodes.  She also felt a "twinge" in her right lower back but attributed the pain to 2 known kidney stones. She reports that she presented to the ED on Thursday and was discharged on Sunday. She was sent home on Vantin x 7 days and has one more day left.  She is overall feeling much better.  She denies any urinary symptoms.  Pulmonary nodule- She reports having a pulmonary nodule noted in her right upper lobe 11 x 7 mm.  This was not present on the CT from 2021. She has appointments scheduled with [facility] for pulmonary and [facility] for cardiology follow ups.  Counseling: She is requesting a referral to see Delma Officer. She needs a referral for insurance purposes. She reports that her mother passed unexpectedly from osteomyelitis/sepsis. She has been grieving this loss and also working on settling her estate.    Obesity- she has lost around 60 pounds in all. She is working hard on diet/exercise and weight loss.  She continue wegovy which is being rx'd by her cardiologist.   Gluten intolerance: She believes she may have a gluten allergy because she feels very unwell when she eats gluten. She now watches what she eats and avoids breads, pasta, and other foods containing gluten.   Immunizations: She is UTD on flu vaccine. She did not have the most recent Covid booster.   Vision: She is UTD on routine vision exams.  Past Medical History:  Diagnosis Date    Adenoma of right adrenal gland 10/12/2018   Noted on MRI Abd, measuring 2.2 x 2.4 x 2.7 cm   Anxiety    Aortic atherosclerosis ([place]) 10/10/2018   Noted on [place]   Diabetes mellitus without complication ([place])    Fatty liver 10/12/2018   Noted on MRI Abd   Fatty liver 09/29/2020   GERD (gastroesophageal reflux disease) 09/16/2015   Mod. noted on KUB   Heart palpitations    High cholesterol    History of chest pain    History of dizziness    History of hiatal hernia 09/16/2015   Small, noted on KUB   History of kidney stones    History of migraine    Hydronephrosis of right kidney 10/12/2018   Mild, noted on MRI Abd   Hyperlipidemia    Left anterior fascicular block (LAFB) 01/10/2013   noted on EKG   Left kidney mass 10/12/2018   Hyperechoic mass mid left kidney measuring 1.0 x 1.0 x 1.2 cm, noted on [place] AB   Nephrolithiasis 10/12/2018   Right, Noted on MRI Abd   Pelvic pain    Chronic, right lower quadrant   Wears contact lenses    Wears glasses     Past Surgical History:  Procedure Laterality Date   ABDOMINAL HYSTERECTOMY     partial   CHOLECYSTECTOMY N/A 11/18/2020   Procedure: LAPAROSCOPIC CHOLECYSTECTOMY;  Surgeon: Coralie Keens, MD;  Location: [facility];  Service: General;  Laterality: N/A;   ENDOMETRIAL ABLATION     Pelvic   HOLMIUM LASER APPLICATION Right [id number]   Procedure: HOLMIUM LASER APPLICATION;  Surgeon: Festus Aloe, MD;  Location: [facility];  Service: Urology;  Laterality: Right;   URETEROSCOPY WITH HOLMIUM LASER LITHOTRIPSY Right 11/15/2018   Procedure: CYSTOSCOPY, RETROGRADE, URETEROSCOPY WITH HOLMIUM LASER LITHOTRIPSY/STENT PLACEMENT;  Surgeon: Festus Aloe, MD;  Location: [facility];  Service: Urology;  Laterality: Right;   VAGINAL HYSTERECTOMY  2007   Partial   WISDOM TOOTH EXTRACTION      Family History  Problem Relation Age of Onset   Diabetes Mother        Vickki Muff (recent  diagnosis) causes thrombocytosis   Heart attack Mother 51   Heart disease Mother    Seizures Mother    Dementia Mother    Hypertension Father    COPD Father    Bipolar disorder Father    Parkinson's disease Father    Cervical cancer Sister    Alcohol abuse Brother    COPD Brother    Hyperlipidemia Brother    Hypertension Brother    Stroke Brother    Heart attack Brother    COPD Maternal Grandmother    Hyperlipidemia Maternal Grandmother    Heart attack Maternal Grandmother    Depression Paternal Grandmother    Heart disease Paternal Grandmother    Heart attack Paternal Grandmother    Colon cancer Neg Hx    Esophageal cancer Neg Hx    Stomach cancer Neg Hx    Rectal cancer Neg Hx     Social History   Socioeconomic History   Marital status: Legally Separated    Spouse name: Not on file   Number of children: Not on file   Years of education: Not on file   Highest education level: Not on file  Occupational History   Occupation: Nurse    Employer: [employer]  Tobacco Use   Smoking status: Never   Smokeless tobacco: Never  Vaping Use   Vaping Use: Never used  Substance and Sexual Activity   Alcohol use: Yes    Comment: occ   Drug use: No   Sexual activity: Not Currently    Birth control/protection: Surgical  Other Topics Concern   Not on file  Social History Narrative   Works for Heart failure team at [employer]   2 daughters   Separated   Enjoys reading/working out, spending time outdoors.     Social Determinants of Health   Financial Resource Strain: Low Risk  (04/02/2018)   Overall Financial Resource Strain (CARDIA)    Difficulty of Paying Living Expenses: Not hard at all  Food Insecurity: No Food Insecurity (11/23/2022)   Hunger Vital Sign    Worried About Running Out of Food in the Last Year: Never true    Ran Out of Food in the Last Year: Never true  Transportation Needs: No Transportation Needs (11/23/2022)   PRAPARE - Radiographer, therapeutic (Medical): No    Lack of Transportation (Non-Medical): No  Physical Activity: Sufficiently Active (04/02/2018)   Exercise Vital Sign    Days of Exercise per Week: 3 days    Minutes of Exercise per Session: 60 min  Stress: Stress Concern Present (04/02/2018)   [place]    Feeling of Stress : Rather much  Social Connections: Moderately Integrated (04/02/2018)  Social Connection and Isolation Panel [NHANES]    Frequency of Communication with Friends and Family: More than three times a week    Frequency of Social Gatherings with Friends and Family: Three times a week    Attends Religious Services: More than 4 times per year    Active Member of Clubs or Organizations: Yes    Attends Archivist Meetings: More than 4 times per year    Marital Status: Separated  Intimate Partner Violence: Not At Risk (11/23/2022)   Humiliation, Afraid, Rape, and Kick questionnaire    Fear of Current or Ex-Partner: No    Emotionally Abused: No    Physically Abused: No    Sexually Abused: No    Outpatient Medications Prior to Visit  Medication Sig Dispense Refill   acetaminophen (TYLENOL) 500 MG tablet Take 1,000 mg by mouth 4 (four) times daily as needed for moderate pain, mild pain or headache.     atorvastatin (LIPITOR) 20 MG tablet Take 1 tablet (20 mg total) by mouth daily. 30 tablet 0   cefpodoxime (VANTIN) 200 MG tablet Take 1 tablet (200 mg total) by mouth 2 (two) times daily for 7 days. 14 tablet 0   fenofibrate (TRICOR) 145 MG tablet Take 1 tablet (145 mg total) by mouth daily. 30 tablet 0   indapamide (LOZOL) 1.25 MG tablet Take 1 tablet (1.25 mg total) by mouth every morning. 30 tablet 3   nitroGLYCERIN (NITROSTAT) 0.4 MG SL tablet Take 1 tablet, under your tongue, while sitting.  If no relief of pain may repeat NTG, one tab every 5 minutes up to 3 tablets total over 15 minutes. 25 tablet 3    Semaglutide-Weight Management (WEGOVY) 2.4 MG/0.75ML SOAJ Inject 2.4 mg into the skin once a week. 3 mL 11   No facility-administered medications prior to visit.    Allergies  Allergen Reactions   Cephalexin Hives, Nausea Only and Rash    Review of Systems  Constitutional:  Positive for fever (103 prior to hospitalization).  Musculoskeletal:  Positive for back pain.  Neurological:  Positive for headaches.       (+) Syncopal episodes        Objective:    Physical Exam Constitutional:      General: She is not in acute distress.    Appearance: Normal appearance. She is well-developed.  HENT:     Head: Normocephalic and atraumatic.     Right Ear: External ear normal.     Left Ear: External ear normal.  Eyes:     General: No scleral icterus. Neck:     Thyroid: No thyromegaly.  Cardiovascular:     Rate and Rhythm: Normal rate and regular rhythm.     Heart sounds: Normal heart sounds. No murmur heard. Pulmonary:     Effort: Pulmonary effort is normal. No respiratory distress.     Breath sounds: Normal breath sounds. No wheezing.  Abdominal:     General: There is no distension.     Palpations: Abdomen is soft.     Tenderness: There is no abdominal tenderness. There is no right CVA tenderness or left CVA tenderness.  Musculoskeletal:     Cervical back: Neck supple.  Skin:    General: Skin is warm and dry.  Neurological:     Mental Status: She is alert and oriented to person, place, and time.  Psychiatric:        Mood and Affect: Mood normal.        Behavior: Behavior  normal.        Thought Content: Thought content normal.        Judgment: Judgment normal.     BP 113/78 (BP Location: Right Arm, Patient Position: Sitting, Cuff Size: Small)   Pulse 97   Temp 97.6 F (36.4 C) (Oral)   Resp 16   Wt 165 lb (74.8 kg)   SpO2 100%   BMI 30.18 kg/m  Wt Readings from Last 3 Encounters:  12/01/22 165 lb (74.8 kg)  11/23/22 182 lb 15.7 oz (83 kg)  05/23/22 183 lb (83  kg)       Assessment & Plan:  Pyelonephritis Assessment & Plan: Wt Readings from Last 3 Encounters:  12/01/22 165 lb (74.8 kg)  11/23/22 182 lb 15.7 oz (83 kg)  05/23/22 183 lb (83 kg)   Resolved. She will complete Vantin rx.   Orders: -     Comprehensive metabolic panel -     CBC with Differential/Platelet  Anemia, unspecified type -     Iron, TIBC and Ferritin Panel  Grief counseling -     Ambulatory referral to Psychology  Syncope, unspecified syncope type Assessment & Plan: Resolved. Was likely secondary to sepsis.    Anxiety  Controlled type 2 diabetes mellitus without complication, without long-term current use of insulin [facility]) Assessment & Plan: Lab Results  Component Value Date   HGBA1C 5.4 11/24/2022   HGBA1C 6.0 12/30/2021   HGBA1C 6.7 (H) 09/12/2021   Lab Results  Component Value Date   MICROALBUR 1.3 01/02/2022   LDLCALC 44 12/30/2021   CREATININE 1.15 (H) 11/25/2022   A1C has come down nicely with her weight loss and Wegovy. Will request copy of her most recent DM eye exam.    Fatty liver Assessment & Plan: Should continue to improve with weight loss efforts.    Primary hypertension Assessment & Plan: BP Readings from Last 3 Encounters:  12/01/22 113/78  11/26/22 128/83  05/23/22 105/71   BP is stable without antihypertensive.  Monitor.   Hyponatremia Assessment & Plan: Was noted on admission.  Repeat Na+ today.    Lung nodule Assessment & Plan: New. Has upcoming consultation scheduled with pulmonology.       I,Rachel Rivera,acting as a Education administrator for Nance Pear, NP.,have documented all relevant documentation on the behalf of Nance Pear, NP,as directed by  Nance Pear, NP while in the presence of Nance Pear, NP.   I, Nance Pear, NP, personally preformed the services described in this documentation.  All medical record entries made by the scribe were at my direction and in my  presence.  I have reviewed the chart and discharge instructions (if applicable) and agree that the record reflects my personal performance and is accurate and complete. 12/01/22   Nance Pear, NP

## 2022-12-01 NOTE — Assessment & Plan Note (Signed)
Lab Results  Component Value Date   HGBA1C 5.4 11/24/2022   HGBA1C 6.0 12/30/2021   HGBA1C 6.7 (H) 09/12/2021   Lab Results  Component Value Date   MICROALBUR 1.3 01/02/2022   LDLCALC 44 12/30/2021   CREATININE 1.15 (H) 11/25/2022   A1C has come down nicely with her weight loss and Wegovy. Will request copy of her most recent DM eye exam.

## 2022-12-01 NOTE — Assessment & Plan Note (Signed)
BP Readings from Last 3 Encounters:  12/01/22 113/78  11/26/22 128/83  05/23/22 105/71   BP is stable without antihypertensive.  Monitor.

## 2022-12-01 NOTE — Assessment & Plan Note (Signed)
Was noted on admission.  Repeat Na+ today.

## 2022-12-01 NOTE — Assessment & Plan Note (Signed)
New. Has upcoming consultation scheduled with pulmonology.

## 2022-12-01 NOTE — Assessment & Plan Note (Addendum)
Wt Readings from Last 3 Encounters:  12/01/22 165 lb (74.8 kg)  11/23/22 182 lb 15.7 oz (83 kg)  05/23/22 183 lb (83 kg)   Resolved. She will complete Vantin rx.

## 2022-12-02 LAB — IRON,TIBC AND FERRITIN PANEL
%SAT: 26 % (calc) (ref 16–45)
Ferritin: 400 ng/mL — ABNORMAL HIGH (ref 16–232)
Iron: 111 ug/dL (ref 45–160)
TIBC: 423 mcg/dL (calc) (ref 250–450)

## 2022-12-03 ENCOUNTER — Telehealth: Payer: Self-pay | Admitting: Family

## 2022-12-03 DIAGNOSIS — D75839 Thrombocytosis, unspecified: Secondary | ICD-10-CM

## 2022-12-03 NOTE — Telephone Encounter (Signed)
Lab work shows elevated ferritin, elevated platelets, elevated calcium.  Ferritin and platelets are elevated likely due to recent illness. I would like for her to repeat lab work in 2 weeks.

## 2022-12-04 NOTE — Telephone Encounter (Signed)
Patient advised of results and scheduled for 12-18-2022

## 2022-12-05 ENCOUNTER — Encounter: Payer: Self-pay | Admitting: Cardiovascular Disease

## 2022-12-05 NOTE — Progress Notes (Unsigned)
Jane Montes Date of Birth  01-08-1971       [facility]    [employer] [address], [address], [place], [place]  [address]   [place],   [phone]     [phone]   Fax  [phone]    Fax [phone]  Problem List: 1. Palpitations 2. Hyperlipidemia 3. Dizziness    Previous notes.   Jane Montes is  a 53 year old female who presents today for followup of her palpitations and episodes of presyncope. She also is noted that her blood pressures been elevated. Is been under lots of stress recently. She's going through a separation. She's been exercising quite a bit and has been lifting a lot of weights. She is on a Paleo diet and has lost 28 pounds.  She's noticed lots of episodes of palpitations and lightheadedness. These primarily occur after she's been weight lifting. She lifts 280 pounds in a dead lift and this typically causes her to be dizzy and see "white spots".  She also has had some palpitations. These occurred at times are not necessarily related he any specific activity.  January 09, 2013:  She has continued to have dizziness and " white spots" in her vision.  She has been seen in the ER.   When she was on her way into the ER she had an  episode of CP / pressure.  Radiation to her right arm.  The pain was intermitant for hours.  Troponins were negtive, head CT was normal.  She has these episodes at work - she has hooked herself up to the monitor and has normal rhythm.  Her BP has also been normal during these episodes.    She broke her foot in January, 2014.  She had previously been doing lots of cardio exercise but has had to back off schedule.  She has continued to do her Cross-Fit workouts without chest pain.   We did a stress test years ago  Sept. 16, 2019:  Jane Montes is seen back after a 5 year absence Has had some stress - going through a separatin and divorce.  Has heart burn. Has a small hiatal hernia    Has been stress eating ,  Has gained 25 lbs.    Lifts weights, spin classes,  Kick boxing  3-5 days a week   No CP with exercise.  Has had some vague CP ( occurred after an argument )  Pain was mid sternal ,   Slightly to the left  Lasted 30-40 min No CP or pressured with her work outs .   Mother had her first MI at age 29.     Feb 13, 2020:  Jane Montes is seen today for follow up of her HTN and hyperlipidemia  She has had kidney stones since her last visit Her triglyceride level has been as high as 737.  Her last triglyceride level on January 19, 2020 is 149. On lipitor, tricor, omega 3 fatty acids  We did a coronary CT angiogram in October, 2019.  She has a coronary calcium score of 0.  She had no plaque in any of her coronary arteries.   Her brother Mickie Hillier died of a MI at 31 Wt is 197 lbs.  No angina  Has had some anxiety issues - started on cymbalt  Is exercising .   Spin class, walking , zoomba  Nov. 15, 2022: Jane Montes is seen today for follow up visit of  her chest pain and HLD  Wt is 214 lbs. ( Up 17 lbs from last year )   Has had some chest pain , radiates to jaw May last for 5-10 min As long as 30 min Not associated with exertion  Has had more leg swelling ,   Takes OTC diuretics  Rides stationary bike twice a week. Avoids salt .   Bp has been borderline elevated  Coronary CT angiogram in October, 2019 revealed a coronary calcium score of 0 and no coronary atherosclerosis.  She has occasional episodes of "feeling off "  in the middle of the day .  She is unsure if it is a BP issue or blood glucose issue  BP has tended to run higher over the past several months.   Likely due to her weight gain   Feb. 17, 2023 Jane Montes is seen for follow up of some episodes of CP, HLD Coronary CT angio: from Oct. 2019 showed a CAC score of 0 with normal coronaries  She has been on   Semaglutide once a week which has helped her lose quite a bit of weight Wt is 205 lbs - down 9 lbs from  last visit  We   December 06, 2022: Jane Montes  is seen today for follow-up visit. Wt is 168 She has been on Wegovy and has lost  40 lbs over the past year   Was just in the hospital for syncope , fever Was found to have pyelonephritis / sepsis   Was incidentally found to have a RUL pulmonary nodule ( 11 mm x 7 mm)  No pulmonary symptoms     Current Outpatient Medications on File Prior to Visit  Medication Sig Dispense Refill   acetaminophen (TYLENOL) 500 MG tablet Take 1,000 mg by mouth 4 (four) times daily as needed for moderate pain, mild pain or headache.     atorvastatin (LIPITOR) 20 MG tablet Take 1 tablet (20 mg total) by mouth daily. 30 tablet 0   fenofibrate (TRICOR) 145 MG tablet Take 1 tablet (145 mg total) by mouth daily. 30 tablet 0   indapamide (LOZOL) 1.25 MG tablet Take 1 tablet (1.25 mg total) by mouth every morning. 30 tablet 3   nitroGLYCERIN (NITROSTAT) 0.4 MG SL tablet Take 1 tablet, under your tongue, while sitting.  If no relief of pain may repeat NTG, one tab every 5 minutes up to 3 tablets total over 15 minutes. 25 tablet 3   Semaglutide-Weight Management (WEGOVY) 2.4 MG/0.75ML SOAJ Inject 2.4 mg into the skin once a week. 3 mL 11   [DISCONTINUED] DULoxetine (CYMBALTA) 30 MG capsule 1 cap PO once daily for 2 weeks, then 1 cap PO QOD x 1 week then stop 18 capsule 0   No current facility-administered medications on file prior to visit.    Allergies  Allergen Reactions   Cephalexin Hives, Nausea Only and Rash    Past Medical History:  Diagnosis Date   Adenoma of right adrenal gland 10/12/2018   Noted on MRI Abd, measuring 2.2 x 2.4 x 2.7 cm   Anxiety    Aortic atherosclerosis ([place]) 10/10/2018   Noted on [place]   Diabetes mellitus without complication ([place])    Fatty liver 10/12/2018   Noted on MRI Abd   Fatty liver 09/29/2020   GERD (gastroesophageal reflux disease) 09/16/2015   Mod. noted on KUB   Heart palpitations    High cholesterol    History of chest  pain    History of  dizziness    History of hiatal hernia 09/16/2015   Small, noted on KUB   History of kidney stones    History of migraine    Hydronephrosis of right kidney 10/12/2018   Mild, noted on MRI Abd   Hyperlipidemia    Left anterior fascicular block (LAFB) 01/10/2013   noted on EKG   Left kidney mass 10/12/2018   Hyperechoic mass mid left kidney measuring 1.0 x 1.0 x 1.2 cm, noted on [place] AB   Nephrolithiasis 10/12/2018   Right, Noted on MRI Abd   Pelvic pain    Chronic, right lower quadrant   Wears contact lenses    Wears glasses     Past Surgical History:  Procedure Laterality Date   ABDOMINAL HYSTERECTOMY     partial   CHOLECYSTECTOMY N/A 11/18/2020   Procedure: LAPAROSCOPIC CHOLECYSTECTOMY;  Surgeon: Coralie Keens, MD;  Location: [facility];  Service: General;  Laterality: N/A;   ENDOMETRIAL ABLATION     Pelvic   HOLMIUM LASER APPLICATION Right [id number]   Procedure: HOLMIUM LASER APPLICATION;  Surgeon: Festus Aloe, MD;  Location[facility];  Service: Urology;  Laterality: Right;   URETEROSCOPY WITH HOLMIUM LASER LITHOTRIPSY Right 11/15/2018   Procedure: CYSTOSCOPY, RETROGRADE, URETEROSCOPY WITH HOLMIUM LASER LITHOTRIPSY/STENT PLACEMENT;  Surgeon: Festus Aloe, MD;  Location: [facility];  Service: Urology;  Laterality: Right;   VAGINAL HYSTERECTOMY  2007   Partial   WISDOM TOOTH EXTRACTION      Social History   Tobacco Use  Smoking Status Never  Smokeless Tobacco Never    Social History   Substance and Sexual Activity  Alcohol Use Yes   Comment: occ    Family History  Problem Relation Age of Onset   Diabetes Mother        Vickki Muff (recent diagnosis) causes thrombocytosis   Heart attack Mother 77       died from sepsis/osteomyelitis   Heart disease Mother    Seizures Mother    Dementia Mother    Hypertension Father    COPD Father    Bipolar disorder Father    Parkinson's  disease Father    Cervical cancer Sister    Alcohol abuse Brother    COPD Brother    Hyperlipidemia Brother    Hypertension Brother    Stroke Brother    Heart attack Brother    COPD Maternal Grandmother    Hyperlipidemia Maternal Grandmother    Heart attack Maternal Grandmother    Depression Paternal Grandmother    Heart disease Paternal Grandmother    Heart attack Paternal Grandmother    Colon cancer Neg Hx    Esophageal cancer Neg Hx    Stomach cancer Neg Hx    Rectal cancer Neg Hx     Reviw of Systems:  Reviewed in the HPI.  All other systems are negative.  Physical Exam: Blood pressure 118/74, pulse 86, height '5\' 2"'$  (1.575 m), weight 168 lb (76.2 kg), SpO2 98 %.       GEN:  Well nourished, well developed in no acute distress HEENT: Normal NECK: No JVD; No carotid bruits LYMPHATICS: No lymphadenopathy CARDIAC: RRR , trace systolic murmur under left breast  RESPIRATORY:  Clear to auscultation without rales, wheezing or rhonchi  ABDOMEN: Soft, non-tender, non-distended MUSCULOSKELETAL:  No edema; No deformity  SKIN: Warm and dry NEUROLOGIC:  Alert and oriented x 3     ECG:    Assessment / Plan:   1.  Chest  discomfort:   -   no recent episodes of CP    2.  Hypertension:  BP looks great    3.  Obesity:   has lost a total of 60 lbs.  Wants to discuss stopping wegovy at some point.  We discussed possible strategies with our pharmacy team. Will discuss with her in 6 months    4.    Hyperlipidemia:    check labs today . Cont current meds.         Mertie Moores, MD  12/06/2022 3:19 PM    [address],  [place], [place]  [address] Pager [phone] Phone: [phone]; Fax: [phone]

## 2022-12-06 ENCOUNTER — Encounter: Payer: Self-pay | Admitting: Cardiovascular Disease

## 2022-12-06 ENCOUNTER — Ambulatory Visit: Payer: 59 | Attending: Cardiovascular Disease | Admitting: Cardiovascular Disease

## 2022-12-06 ENCOUNTER — Other Ambulatory Visit: Payer: Self-pay | Admitting: Family

## 2022-12-06 VITALS — BP 118/74 | HR 86 | Ht 62.0 in | Wt 168.0 lb

## 2022-12-06 DIAGNOSIS — E785 Hyperlipidemia, unspecified: Secondary | ICD-10-CM | POA: Diagnosis not present

## 2022-12-06 MED ORDER — FENOFIBRATE 145 MG PO TABS
145.0000 mg | ORAL_TABLET | Freq: Every day | ORAL | 1 refills | Status: DC
  Filled 2022-12-06: qty 90, 90d supply, fill #0
  Filled 2023-03-06: qty 90, 90d supply, fill #1

## 2022-12-06 MED ORDER — ATORVASTATIN CALCIUM 20 MG PO TABS
20.0000 mg | ORAL_TABLET | Freq: Every day | ORAL | 1 refills | Status: DC
  Filled 2022-12-06: qty 90, 90d supply, fill #0
  Filled 2023-03-06: qty 90, 90d supply, fill #1

## 2022-12-06 NOTE — Patient Instructions (Signed)
Medication Instructions:  Your physician recommends that you continue on your current medications as directed. Please refer to the Current Medication list given to you today.  *If you need a refill on your cardiac medications before your next appointment, please call your pharmacy*   Lab Work: Lipids, ALT If you have labs (blood work) drawn today and your tests are completely normal, you will receive your results only by: [place] (if you have MyChart) OR A paper copy in the mail If you have any lab test that is abnormal or we need to change your treatment, we will call you to review the results.   Follow-Up: At [facility], you and your health needs are our priority.  As part of our continuing mission to provide you with exceptional heart care, we have created designated Provider Care Teams.  These Care Teams include your primary Cardiologist (physician) and Advanced Practice Providers (APPs -  Physician Assistants and Nurse Practitioners) who all work together to provide you with the care you need, when you need it.    Your next appointment:   6 month(s)  Provider:   Mertie Moores, MD

## 2022-12-07 ENCOUNTER — Other Ambulatory Visit: Payer: Self-pay

## 2022-12-07 ENCOUNTER — Other Ambulatory Visit (HOSPITAL_COMMUNITY): Payer: Self-pay

## 2022-12-07 LAB — LIPID PANEL
Chol/HDL Ratio: 3.3 ratio (ref 0.0–4.4)
Cholesterol, Total: 146 mg/dL (ref 100–199)
HDL: 44 mg/dL (ref >39)
LDL Chol Calc (NIH): 74 mg/dL (ref 0–99)
Triglycerides: 161 mg/dL — ABNORMAL HIGH (ref 0–149)
VLDL Cholesterol Cal: 28 mg/dL (ref 5–40)

## 2022-12-07 LAB — ALT: ALT: 26 IU/L (ref 0–32)

## 2022-12-08 ENCOUNTER — Encounter: Payer: Self-pay | Admitting: Family

## 2022-12-10 NOTE — Progress Notes (Unsigned)
Synopsis: Referred in March 2024 for pulmonary nodule by Debbrah Alar, NP  Subjective:   PATIENT ID: Jane Montes GENDER: female DOB: 1971/04/06, MRN: [id number]  No chief complaint on file.   This is a 52 year old female, past medical history of left kidney lesion, pelvic pain, diabetes, kidney stones.Patient presents after having a CT scan of the chest completed on 11/23/2022.  CT of the chest reveals a 11 x 7 mm subpleural density noted along the medial portion of the right upper lobe.  Patient is a lifelong non-smoker.    ***  Past Medical History:  Diagnosis Date   Adenoma of right adrenal gland 10/12/2018   Noted on MRI Abd, measuring 2.2 x 2.4 x 2.7 cm   Anxiety    Aortic atherosclerosis ([place]) 10/10/2018   Noted on [place]   Diabetes mellitus without complication ([place])    Fatty liver 10/12/2018   Noted on MRI Abd   Fatty liver 09/29/2020   GERD (gastroesophageal reflux disease) 09/16/2015   Mod. noted on KUB   Heart palpitations    High cholesterol    History of chest pain    History of dizziness    History of hiatal hernia 09/16/2015   Small, noted on KUB   History of kidney stones    History of migraine    Hydronephrosis of right kidney 10/12/2018   Mild, noted on MRI Abd   Hyperlipidemia    Left anterior fascicular block (LAFB) 01/10/2013   noted on EKG   Left kidney mass 10/12/2018   Hyperechoic mass mid left kidney measuring 1.0 x 1.0 x 1.2 cm, noted on [place] AB   Nephrolithiasis 10/12/2018   Right, Noted on MRI Abd   Pelvic pain    Chronic, right lower quadrant   Wears contact lenses    Wears glasses      Family History  Problem Relation Age of Onset   Diabetes Mother        Vickki Muff (recent diagnosis) causes thrombocytosis   Heart attack Mother 14       died from sepsis/osteomyelitis   Heart disease Mother    Seizures Mother    Dementia Mother    Hypertension Father    COPD Father    Bipolar disorder Father    Parkinson's disease  Father    Cervical cancer Sister    Alcohol abuse Brother    COPD Brother    Hyperlipidemia Brother    Hypertension Brother    Stroke Brother    Heart attack Brother    COPD Maternal Grandmother    Hyperlipidemia Maternal Grandmother    Heart attack Maternal Grandmother    Depression Paternal Grandmother    Heart disease Paternal Grandmother    Heart attack Paternal Grandmother    Colon cancer Neg Hx    Esophageal cancer Neg Hx    Stomach cancer Neg Hx    Rectal cancer Neg Hx      Past Surgical History:  Procedure Laterality Date   ABDOMINAL HYSTERECTOMY     partial   CHOLECYSTECTOMY N/A 11/18/2020   Procedure: LAPAROSCOPIC CHOLECYSTECTOMY;  Surgeon: Coralie Keens, MD;  Location: [place];  Service: General;  Laterality: N/A;   ENDOMETRIAL ABLATION     Pelvic   HOLMIUM LASER APPLICATION Right [id number]   Procedure: HOLMIUM LASER APPLICATION;  Surgeon: Festus Aloe, MD;  Location: [facility]  Service: Urology;  Laterality: Right;   URETEROSCOPY WITH HOLMIUM LASER LITHOTRIPSY Right 11/15/2018   Procedure:  CYSTOSCOPY, RETROGRADE, URETEROSCOPY WITH HOLMIUM LASER LITHOTRIPSY/STENT PLACEMENT;  Surgeon: Festus Aloe, MD;  Location: [facility];  Service: Urology;  Laterality: Right;   VAGINAL HYSTERECTOMY  2007   Partial   WISDOM TOOTH EXTRACTION      Social History   Socioeconomic History   Marital status: Legally Separated    Spouse name: Not on file   Number of children: Not on file   Years of education: Not on file   Highest education level: Not on file  Occupational History   Occupation: Nurse    Employer: [employer]  Tobacco Use   Smoking status: Never   Smokeless tobacco: Never  Vaping Use   Vaping Use: Never used  Substance and Sexual Activity   Alcohol use: Yes    Comment: occ   Drug use: No   Sexual activity: Not Currently    Birth control/protection: Surgical  Other Topics Concern   Not on file   Social History Narrative   Works for Heart failure team at [employer]   2 daughters   Divorced   Enjoys reading/working out, spending time outdoors.     Social Determinants of Health   Financial Resource Strain: Low Risk  (04/02/2018)   Overall Financial Resource Strain (CARDIA)    Difficulty of Paying Living Expenses: Not hard at all  Food Insecurity: No Food Insecurity (11/23/2022)   Hunger Vital Sign    Worried About Running Out of Food in the Last Year: Never true    Ran Out of Food in the Last Year: Never true  Transportation Needs: No Transportation Needs (11/23/2022)   PRAPARE - Hydrologist (Medical): No    Lack of Transportation (Non-Medical): No  Physical Activity: Sufficiently Active (04/02/2018)   Exercise Vital Sign    Days of Exercise per Week: 3 days    Minutes of Exercise per Session: 60 min  Stress: Stress Concern Present (04/02/2018)   [place]    Feeling of Stress : Rather much  Social Connections: Moderately Integrated (04/02/2018)   Social Connection and Isolation Panel [NHANES]    Frequency of Communication with Friends and Family: More than three times a week    Frequency of Social Gatherings with Friends and Family: Three times a week    Attends Religious Services: More than 4 times per year    Active Member of Clubs or Organizations: Yes    Attends Archivist Meetings: More than 4 times per year    Marital Status: Separated  Intimate Partner Violence: Not At Risk (11/23/2022)   Humiliation, Afraid, Rape, and Kick questionnaire    Fear of Current or Ex-Partner: No    Emotionally Abused: No    Physically Abused: No    Sexually Abused: No     Allergies  Allergen Reactions   Cephalexin Hives, Nausea Only and Rash     Outpatient Medications Prior to Visit  Medication Sig Dispense Refill   acetaminophen (TYLENOL) 500 MG tablet Take 1,000 mg by mouth 4  (four) times daily as needed for moderate pain, mild pain or headache.     atorvastatin (LIPITOR) 20 MG tablet Take 1 tablet (20 mg total) by mouth daily. Need office visit. 90 tablet 1   fenofibrate (TRICOR) 145 MG tablet Take 1 tablet (145 mg total) by mouth daily. Need office visit. 90 tablet 1   indapamide (LOZOL) 1.25 MG tablet Take 1 tablet (1.25 mg total) by  mouth every morning. 30 tablet 3   nitroGLYCERIN (NITROSTAT) 0.4 MG SL tablet Take 1 tablet, under your tongue, while sitting.  If no relief of pain may repeat NTG, one tab every 5 minutes up to 3 tablets total over 15 minutes. 25 tablet 3   Semaglutide-Weight Management (WEGOVY) 2.4 MG/0.75ML SOAJ Inject 2.4 mg into the skin once a week. 3 mL 11   No facility-administered medications prior to visit.    ROS   Objective:  Physical Exam   There were no vitals filed for this visit.   on *** LPM *** RA BMI Readings from Last 3 Encounters:  12/06/22 30.73 kg/m  12/01/22 30.18 kg/m  11/23/22 33.47 kg/m   Wt Readings from Last 3 Encounters:  12/06/22 168 lb (76.2 kg)  12/01/22 165 lb (74.8 kg)  11/23/22 182 lb 15.7 oz (83 kg)     CBC    Component Value Date/Time   WBC 10.7 (H) 12/01/2022 0825   RBC 4.24 12/01/2022 0825   HGB 12.9 12/01/2022 0825   HCT 37.3 12/01/2022 0825   PLT 674.0 (H) 12/01/2022 0825   MCV 87.9 12/01/2022 0825   MCH 29.3 11/26/2022 0420   MCHC 34.5 12/01/2022 0825   RDW 13.3 12/01/2022 0825   LYMPHSABS 2.7 12/01/2022 0825   MONOABS 0.6 12/01/2022 0825   EOSABS 0.3 12/01/2022 0825   BASOSABS 0.1 12/01/2022 0825    ***  Chest Imaging: ***  Pulmonary Functions Testing Results:     No data to display          FeNO: ***  Pathology: ***  Echocardiogram: ***  Heart Catheterization: ***    Assessment & Plan:   No diagnosis found.  Discussion: ***   Current Outpatient Medications:    acetaminophen (TYLENOL) 500 MG tablet, Take 1,000 mg by mouth 4 (four) times daily as  needed for moderate pain, mild pain or headache., Disp: , Rfl:    atorvastatin (LIPITOR) 20 MG tablet, Take 1 tablet (20 mg total) by mouth daily. Need office visit., Disp: 90 tablet, Rfl: 1   fenofibrate (TRICOR) 145 MG tablet, Take 1 tablet (145 mg total) by mouth daily. Need office visit., Disp: 90 tablet, Rfl: 1   indapamide (LOZOL) 1.25 MG tablet, Take 1 tablet (1.25 mg total) by mouth every morning., Disp: 30 tablet, Rfl: 3   nitroGLYCERIN (NITROSTAT) 0.4 MG SL tablet, Take 1 tablet, under your tongue, while sitting.  If no relief of pain may repeat NTG, one tab every 5 minutes up to 3 tablets total over 15 minutes., Disp: 25 tablet, Rfl: 3   Semaglutide-Weight Management (WEGOVY) 2.4 MG/0.75ML SOAJ, Inject 2.4 mg into the skin once a week., Disp: 3 mL, Rfl: 11  I spent *** minutes dedicated to the care of this patient on the date of this encounter to include pre-visit review of records, face-to-face time with the patient discussing conditions above, post visit ordering of testing, clinical documentation with the electronic health record, making appropriate referrals as documented, and communicating necessary findings to members of the patients care team.   Garner Nash, DO [facility] 12/10/2022 5:28 PM

## 2022-12-11 ENCOUNTER — Ambulatory Visit: Payer: 59 | Admitting: Pulmonary Disease

## 2022-12-11 ENCOUNTER — Encounter: Payer: Self-pay | Admitting: Pulmonary Disease

## 2022-12-11 VITALS — BP 120/80 | HR 88 | Ht 62.0 in | Wt 167.4 lb

## 2022-12-11 DIAGNOSIS — R911 Solitary pulmonary nodule: Secondary | ICD-10-CM | POA: Diagnosis not present

## 2022-12-11 DIAGNOSIS — Z7722 Contact with and (suspected) exposure to environmental tobacco smoke (acute) (chronic): Secondary | ICD-10-CM

## 2022-12-11 DIAGNOSIS — A419 Sepsis, unspecified organism: Secondary | ICD-10-CM

## 2022-12-11 NOTE — Patient Instructions (Addendum)
Thank you for visiting Dr. Valeta Harms at [facility]. Today we recommend the following:  Orders Placed This Encounter  Procedures   CT Chest Wo Contrast   Please get CT chest last week of April or first week of May 2024  Return if symptoms worsen or fail to improve.    Please do your part to reduce the spread of COVID-19.

## 2022-12-18 ENCOUNTER — Other Ambulatory Visit (INDEPENDENT_AMBULATORY_CARE_PROVIDER_SITE_OTHER): Payer: 59

## 2022-12-18 DIAGNOSIS — D75839 Thrombocytosis, unspecified: Secondary | ICD-10-CM

## 2022-12-18 LAB — COMPREHENSIVE METABOLIC PANEL
ALT: 15 U/L (ref 0–35)
AST: 18 U/L (ref 0–37)
Albumin: 3.9 g/dL (ref 3.5–5.2)
Alkaline Phosphatase: 58 U/L (ref 39–117)
BUN: 26 mg/dL — ABNORMAL HIGH (ref 6–23)
CO2: 30 mEq/L (ref 19–32)
Calcium: 10 mg/dL (ref 8.4–10.5)
Chloride: 104 mEq/L (ref 96–112)
Creatinine, Ser: 1.37 mg/dL — ABNORMAL HIGH (ref 0.40–1.20)
GFR: 44.67 mL/min — ABNORMAL LOW (ref >60.00)
Glucose, Bld: 92 mg/dL (ref 70–99)
Potassium: 3.8 mEq/L (ref 3.5–5.1)
Sodium: 142 mEq/L (ref 135–145)
Total Bilirubin: 0.3 mg/dL (ref 0.2–1.2)
Total Protein: 6.4 g/dL (ref 6.0–8.3)

## 2022-12-18 LAB — CBC WITH DIFFERENTIAL/PLATELET
Basophils Absolute: 0.1 10*3/uL (ref 0.0–0.1)
Basophils Relative: 0.9 % (ref 0.0–3.0)
Eosinophils Absolute: 0.2 10*3/uL (ref 0.0–0.7)
Eosinophils Relative: 2.5 % (ref 0.0–5.0)
HCT: 35.9 % — ABNORMAL LOW (ref 36.0–46.0)
Hemoglobin: 12.1 g/dL (ref 12.0–15.0)
Lymphocytes Relative: 28.1 % (ref 12.0–46.0)
Lymphs Abs: 2.1 10*3/uL (ref 0.7–4.0)
MCHC: 33.8 g/dL (ref 30.0–36.0)
MCV: 88.2 fl (ref 78.0–100.0)
Monocytes Absolute: 0.7 10*3/uL (ref 0.1–1.0)
Monocytes Relative: 9.4 % (ref 3.0–12.0)
Neutro Abs: 4.4 10*3/uL (ref 1.4–7.7)
Neutrophils Relative %: 59.1 % (ref 43.0–77.0)
Platelets: 345 10*3/uL (ref 150.0–400.0)
RBC: 4.07 Mil/uL (ref 3.87–5.11)
RDW: 14.5 % (ref 11.5–15.5)
WBC: 7.5 10*3/uL (ref 4.0–10.5)

## 2022-12-18 LAB — FERRITIN: Ferritin: 157.3 ng/mL (ref 10.0–291.0)

## 2022-12-19 ENCOUNTER — Ambulatory Visit (HOSPITAL_BASED_OUTPATIENT_CLINIC_OR_DEPARTMENT_OTHER)
Admission: RE | Admit: 2022-12-19 | Discharge: 2022-12-19 | Disposition: A | Discharge status: Home / Self Care | Payer: 59 | Source: Ambulatory Visit | Attending: Family | Admitting: Family

## 2022-12-19 ENCOUNTER — Other Ambulatory Visit: Payer: Self-pay | Admitting: Family

## 2022-12-19 ENCOUNTER — Telehealth: Payer: Self-pay | Admitting: Family

## 2022-12-19 DIAGNOSIS — Z87442 Personal history of urinary calculi: Secondary | ICD-10-CM

## 2022-12-19 DIAGNOSIS — N1832 Chronic kidney disease, stage 3b: Secondary | ICD-10-CM

## 2022-12-19 DIAGNOSIS — R1031 Right lower quadrant pain: Secondary | ICD-10-CM

## 2022-12-19 DIAGNOSIS — N1330 Unspecified hydronephrosis: Secondary | ICD-10-CM | POA: Diagnosis not present

## 2022-12-19 NOTE — Telephone Encounter (Signed)
See mychart.  

## 2022-12-20 ENCOUNTER — Telehealth: Payer: Self-pay | Admitting: Family

## 2022-12-20 ENCOUNTER — Ambulatory Visit (HOSPITAL_BASED_OUTPATIENT_CLINIC_OR_DEPARTMENT_OTHER)
Admission: RE | Admit: 2022-12-20 | Discharge: 2022-12-20 | Disposition: A | Discharge status: Home / Self Care | Payer: 59 | Source: Ambulatory Visit | Attending: Family | Admitting: Family

## 2022-12-20 DIAGNOSIS — K760 Fatty (change of) liver, not elsewhere classified: Secondary | ICD-10-CM | POA: Diagnosis not present

## 2022-12-20 DIAGNOSIS — R1031 Right lower quadrant pain: Secondary | ICD-10-CM

## 2022-12-20 DIAGNOSIS — N200 Calculus of kidney: Secondary | ICD-10-CM | POA: Diagnosis not present

## 2022-12-20 MED ORDER — IOHEXOL 300 MG/ML  SOLN
100.0000 mL | Freq: Once | INTRAMUSCULAR | Status: AC | PRN
  Administered 2022-12-20: 100 mL via INTRAVENOUS

## 2022-12-20 NOTE — Addendum Note (Signed)
Addended by: Debbrah Alar on: 12/20/2022 01:02 PM   Modules accepted: Orders

## 2022-12-20 NOTE — Telephone Encounter (Signed)
[facility] called to give call report. No one available so she asked that I confirm the results for the renal ultrasound are visible. Advised that I did see results. She asked that CMA/Provider be made aware

## 2022-12-20 NOTE — Telephone Encounter (Signed)
noted 

## 2022-12-26 ENCOUNTER — Other Ambulatory Visit (HOSPITAL_COMMUNITY): Payer: Self-pay

## 2022-12-26 DIAGNOSIS — R1031 Right lower quadrant pain: Secondary | ICD-10-CM | POA: Diagnosis not present

## 2022-12-26 MED ORDER — INDAPAMIDE 1.25 MG PO TABS
1.2500 mg | ORAL_TABLET | Freq: Every morning | ORAL | 3 refills | Status: AC
  Filled 2022-12-26: qty 90, 90d supply, fill #0
  Filled 2023-04-02: qty 90, 90d supply, fill #1
  Filled 2023-06-27 – 2023-06-28 (×2): qty 90, 90d supply, fill #2
  Filled 2023-09-19: qty 90, 90d supply, fill #3

## 2022-12-27 ENCOUNTER — Telehealth: Payer: Self-pay | Admitting: Cardiovascular Disease

## 2022-12-27 NOTE — Telephone Encounter (Signed)
Pt c/o medication issue:  1. Name of Medication:   Semaglutide-Weight Management (WEGOVY) 2.4 MG/0.75ML SOAJ   2. How are you currently taking this medication (dosage and times per day)?   3. Are you having a reaction (difficulty breathing--STAT)?   4. What is your medication issue?   Patient wants a call back to discuss this medication.

## 2022-12-27 NOTE — Telephone Encounter (Signed)
Returned call to pt. Losing Wegovy coverage on April 1 with [facility] plan. She has baseline A1c of 6.7% in diabetic range. Should qualify for Mounjaro. Previously had stomach pain on Ozempic. Has lost 55 lbs so far on Wegovy. Will submit PA request for [facility]. Key: [id number].

## 2022-12-29 ENCOUNTER — Other Ambulatory Visit (HOSPITAL_COMMUNITY): Payer: Self-pay

## 2022-12-29 MED ORDER — MOUNJARO 10 MG/0.5ML ~~LOC~~ SOAJ
10.0000 mg | SUBCUTANEOUS | 0 refills | Status: DC
  Filled 2022-12-29 – 2023-01-02 (×3): qty 2, 28d supply, fill #0

## 2022-12-29 NOTE — Telephone Encounter (Signed)
Mounjaro PA approved through 12/27/23. Pt tolerating Wegovy 2.4mg  weekly well. Will transition to [facility] 10mg  weekly. Pt will call clinic once she's given a few of her Mounjaro doses with a tolerability update and can determine next dose to send in at that time. Reviewed similarities/differences between the injections.

## 2023-01-02 ENCOUNTER — Other Ambulatory Visit (HOSPITAL_COMMUNITY): Payer: Self-pay

## 2023-01-03 ENCOUNTER — Other Ambulatory Visit: Payer: Self-pay

## 2023-01-23 DIAGNOSIS — F419 Anxiety disorder, unspecified: Secondary | ICD-10-CM | POA: Diagnosis not present

## 2023-01-30 ENCOUNTER — Telehealth: Payer: Self-pay | Admitting: Cardiovascular Disease

## 2023-01-30 NOTE — Telephone Encounter (Signed)
Pt c/o medication issue:  1. Name of Medication: tirzepatide (MOUNJARO) 10 MG/0.5ML Pen   2. How are you currently taking this medication (dosage and times per day)? As prescribed   3. Are you having a reaction (difficulty breathing--STAT)? No   4. What is your medication issue? Patient is calling stating Aundra Millet advised she call after taking first months worth of medication to discuss dosage. Please advise.

## 2023-01-31 ENCOUNTER — Other Ambulatory Visit: Payer: Self-pay

## 2023-01-31 ENCOUNTER — Other Ambulatory Visit (HOSPITAL_COMMUNITY): Payer: Self-pay

## 2023-01-31 MED ORDER — MOUNJARO 12.5 MG/0.5ML ~~LOC~~ SOAJ
12.5000 mg | SUBCUTANEOUS | 0 refills | Status: DC
  Filled 2023-01-31 (×2): qty 2, 28d supply, fill #0

## 2023-01-31 NOTE — Telephone Encounter (Signed)
Called pt back. Pt reports tolerating Mounjaro well, no side effects. Wants to increase her dose. Rx sent in for 12.5mg  dose for next month. She will call in once she's given 3 of this dose, can increase to  maintenance dose if tolerating well.

## 2023-02-08 ENCOUNTER — Ambulatory Visit (HOSPITAL_BASED_OUTPATIENT_CLINIC_OR_DEPARTMENT_OTHER)
Admission: RE | Admit: 2023-02-08 | Discharge: 2023-02-08 | Disposition: A | Discharge status: Home / Self Care | Payer: 59 | Source: Ambulatory Visit | Attending: Family | Admitting: Family

## 2023-02-08 DIAGNOSIS — R918 Other nonspecific abnormal finding of lung field: Secondary | ICD-10-CM | POA: Diagnosis not present

## 2023-02-08 DIAGNOSIS — R911 Solitary pulmonary nodule: Secondary | ICD-10-CM

## 2023-02-12 ENCOUNTER — Encounter: Payer: Self-pay | Admitting: Pulmonary Disease

## 2023-02-14 LAB — HM DIABETES EYE EXAM

## 2023-02-15 NOTE — Progress Notes (Signed)
I called and spoke with the patient regarding CT results.  Thanks,  BLI  Josephine Igo, DO [facility] 02/15/2023 4:25 PM

## 2023-02-16 DIAGNOSIS — F419 Anxiety disorder, unspecified: Secondary | ICD-10-CM | POA: Diagnosis not present

## 2023-03-01 DIAGNOSIS — F419 Anxiety disorder, unspecified: Secondary | ICD-10-CM | POA: Diagnosis not present

## 2023-03-06 ENCOUNTER — Other Ambulatory Visit (HOSPITAL_COMMUNITY): Payer: Self-pay

## 2023-03-06 MED ORDER — MOUNJARO 15 MG/0.5ML ~~LOC~~ SOAJ
15.0000 mg | SUBCUTANEOUS | 5 refills | Status: DC
  Filled 2023-03-06: qty 2, 28d supply, fill #0
  Filled 2023-04-02 – 2023-05-02 (×3): qty 2, 28d supply, fill #1
  Filled 2023-05-29 (×2): qty 2, 28d supply, fill #2
  Filled 2023-06-27 – 2023-06-28 (×2): qty 2, 28d supply, fill #3
  Filled 2023-07-25: qty 2, 28d supply, fill #4
  Filled 2023-08-22: qty 2, 28d supply, fill #5

## 2023-03-06 NOTE — Addendum Note (Signed)
Addended by: Tylene Fantasia on: 03/06/2023 04:17 PM   Modules accepted: Orders

## 2023-03-06 NOTE — Telephone Encounter (Signed)
Spoke to patient, tolerating Mounjaro 12.5 mg well. FBG<100. Weight is stable.  Will increase to 15 mg. Prescription sent to [facility] pharmacy 1 month with 5 refills

## 2023-03-06 NOTE — Telephone Encounter (Signed)
Patient is calling back to give update on medication. Requesting call back from [place], RPH-CPP.

## 2023-03-22 DIAGNOSIS — F419 Anxiety disorder, unspecified: Secondary | ICD-10-CM | POA: Diagnosis not present

## 2023-04-03 ENCOUNTER — Other Ambulatory Visit (HOSPITAL_COMMUNITY): Payer: Self-pay

## 2023-04-05 ENCOUNTER — Other Ambulatory Visit (HOSPITAL_COMMUNITY): Payer: Self-pay

## 2023-04-05 ENCOUNTER — Telehealth: Payer: Self-pay | Admitting: Cardiovascular Disease

## 2023-04-05 MED ORDER — MOUNJARO 12.5 MG/0.5ML ~~LOC~~ SOAJ
12.5000 mg | SUBCUTANEOUS | 0 refills | Status: DC
  Filled 2023-04-05 – 2023-04-09 (×2): qty 2, 28d supply, fill #0

## 2023-04-05 NOTE — Telephone Encounter (Signed)
Pt c/o medication issue:  1. Name of Medication: tirzepatide (MOUNJARO) 15 MG/0.5ML Pen   2. How are you currently taking this medication (dosage and times per day)? As written    3. Are you having a reaction (difficulty breathing--STAT)? No   4. What is your medication issue? Pt called in stating her pharmacy does not have 15 mg because it is on backorder but they have 12.5 and she wants to know if that one can be sent in instead.

## 2023-04-06 DIAGNOSIS — F419 Anxiety disorder, unspecified: Secondary | ICD-10-CM | POA: Diagnosis not present

## 2023-04-09 ENCOUNTER — Other Ambulatory Visit (HOSPITAL_COMMUNITY): Payer: Self-pay

## 2023-04-17 DIAGNOSIS — Z01419 Encounter for gynecological examination (general) (routine) without abnormal findings: Secondary | ICD-10-CM | POA: Diagnosis not present

## 2023-04-17 DIAGNOSIS — Z1231 Encounter for screening mammogram for malignant neoplasm of breast: Secondary | ICD-10-CM | POA: Diagnosis not present

## 2023-04-17 LAB — HM MAMMOGRAPHY

## 2023-04-25 DIAGNOSIS — F419 Anxiety disorder, unspecified: Secondary | ICD-10-CM | POA: Diagnosis not present

## 2023-04-30 ENCOUNTER — Other Ambulatory Visit (HOSPITAL_COMMUNITY): Payer: Self-pay

## 2023-05-03 ENCOUNTER — Other Ambulatory Visit (HOSPITAL_COMMUNITY): Payer: Self-pay

## 2023-05-16 DIAGNOSIS — F419 Anxiety disorder, unspecified: Secondary | ICD-10-CM | POA: Diagnosis not present

## 2023-05-29 ENCOUNTER — Other Ambulatory Visit (HOSPITAL_COMMUNITY): Payer: Self-pay

## 2023-05-29 ENCOUNTER — Other Ambulatory Visit: Payer: Self-pay | Admitting: Family

## 2023-05-29 MED ORDER — FENOFIBRATE 145 MG PO TABS
145.0000 mg | ORAL_TABLET | Freq: Every day | ORAL | 1 refills | Status: DC
  Filled 2023-05-29: qty 90, 90d supply, fill #0
  Filled 2023-09-19: qty 90, 90d supply, fill #1

## 2023-05-29 MED ORDER — ATORVASTATIN CALCIUM 20 MG PO TABS
20.0000 mg | ORAL_TABLET | Freq: Every day | ORAL | 1 refills | Status: DC
  Filled 2023-05-29: qty 90, 90d supply, fill #0
  Filled 2023-09-19: qty 90, 90d supply, fill #1

## 2023-05-30 ENCOUNTER — Other Ambulatory Visit: Payer: Self-pay

## 2023-05-31 DIAGNOSIS — F419 Anxiety disorder, unspecified: Secondary | ICD-10-CM | POA: Diagnosis not present

## 2023-06-03 ENCOUNTER — Encounter: Payer: Self-pay | Admitting: Cardiovascular Disease

## 2023-06-03 NOTE — Progress Notes (Unsigned)
Jane Montes Date of Birth  May 16, 1971       [facility]    [place] [address]  [address] [place], [place]  [address]   [place], [place]  [address] [phone]     [phone]   Fax  [phone]    Fax [phone]  Problem List: 1. Palpitations 2. Hyperlipidemia 3. Dizziness    Previous notes.   Jane Montes is  a 52 year old female who presents today for followup of her palpitations and episodes of presyncope. She also is noted that her blood pressures been elevated. Is been under lots of stress recently. She's going through a separation. She's been exercising quite a bit and has been lifting a lot of weights. She is on a Paleo diet and has lost 28 pounds.  She's noticed lots of episodes of palpitations and lightheadedness. These primarily occur after she's been weight lifting. She lifts 280 pounds in a dead lift and this typically causes her to be dizzy and see "white spots".  She also has had some palpitations. These occurred at times are not necessarily related he any specific activity.  January 09, 2013:  She has continued to have dizziness and " white spots" in her vision.  She has been seen in the ER.   When she was on her way into the ER she had an  episode of CP / pressure.  Radiation to her right arm.  The pain was intermitant for hours.  Troponins were negtive, head CT was normal.  She has these episodes at work - she has hooked herself up to the monitor and has normal rhythm.  Her BP has also been normal during these episodes.    She broke her foot in January, 2014.  She had previously been doing lots of cardio exercise but has had to back off schedule.  She has continued to do her Cross-Fit workouts without chest pain.   We did a stress test years ago  Sept. 16, 2019:  Jane Montes is seen back after a 5 year absence Has had some stress - going through a separatin and divorce.  Has heart burn. Has a small hiatal hernia    Has been stress eating ,  Has gained 25 lbs.    Lifts weights, spin classes,  Kick boxing  3-5 days a week   No CP with exercise.  Has had some vague CP ( occurred after an argument )  Pain was mid sternal ,   Slightly to the left  Lasted 30-40 min No CP or pressured with her work outs .   Mother had her first MI at age 71.     Feb 13, 2020:  Jane Montes is seen today for follow up of her HTN and hyperlipidemia  She has had kidney stones since her last visit Her triglyceride level has been as high as 737.  Her last triglyceride level on January 19, 2020 is 149. On lipitor, tricor, omega 3 fatty acids  We did a coronary CT angiogram in October, 2019.  She has a coronary calcium score of 0.  She had no plaque in any of her coronary arteries.   Her brother Cherre Blanc died of a MI at 53 Wt is 197 lbs.  No angina  Has had some anxiety issues - started on cymbalt  Is exercising .   Spin class, walking , zoomba  Nov. 15, 2022: Jane Montes is seen today for follow up visit of  her chest pain and HLD  Wt is 214 lbs. ( Up 17 lbs from last year )   Has had some chest pain , radiates to jaw May last for 5-10 min As long as 30 min Not associated with exertion  Has had more leg swelling ,   Takes OTC diuretics  Rides stationary bike twice a week. Avoids salt .   Bp has been borderline elevated  Coronary CT angiogram in October, 2019 revealed a coronary calcium score of 0 and no coronary atherosclerosis.  She has occasional episodes of "feeling off "  in the middle of the day .  She is unsure if it is a BP issue or blood glucose issue  BP has tended to run higher over the past several months.   Likely due to her weight gain   Feb. 17, 2023 Jane Montes is seen for follow up of some episodes of CP, HLD Coronary CT angio: from Oct. 2019 showed a CAC score of 0 with normal coronaries  She has been on   Semaglutide once a week which has helped her lose quite a bit of weight Wt is 205 lbs - down 9 lbs from  last visit  We   December 06, 2022: Jane Montes  is seen today for follow-up visit. Wt is 168 She has been on Wegovy and has lost  40 lbs over the past year   Was just in the hospital for syncope , fever Was found to have pyelonephritis / sepsis   Was incidentally found to have a RUL pulmonary nodule ( 11 mm x 7 mm)  No pulmonary symptoms     Aug. 26, 2024 Jane Montes is seen for follow up of her HLD, her CAC score is 0  Hx of obesity Wt is ***     Current Outpatient Medications on File Prior to Visit  Medication Sig Dispense Refill   acetaminophen (TYLENOL) 500 MG tablet Take 1,000 mg by mouth 4 (four) times daily as needed for moderate pain, mild pain or headache.     atorvastatin (LIPITOR) 20 MG tablet Take 1 tablet (20 mg total) by mouth daily. Need office visit. 90 tablet 1   fenofibrate (TRICOR) 145 MG tablet Take 1 tablet (145 mg total) by mouth daily. Need office visit. 90 tablet 1   indapamide (LOZOL) 1.25 MG tablet Take 1 tablet (1.25 mg total) by mouth every morning. 30 tablet 3   indapamide (LOZOL) 1.25 MG tablet Take 1 tablet (1.25 mg total) by mouth every morning. 90 tablet 3   nitroGLYCERIN (NITROSTAT) 0.4 MG SL tablet Take 1 tablet, under your tongue, while sitting.  If no relief of pain may repeat NTG, one tab every 5 minutes up to 3 tablets total over 15 minutes. 25 tablet 3   tirzepatide (MOUNJARO) 12.5 MG/0.5ML Pen Inject 12.5 mg into the skin once a week. 2 mL 0   tirzepatide (MOUNJARO) 15 MG/0.5ML Pen Inject 15 mg into the skin once a week. 2 mL 5   [DISCONTINUED] DULoxetine (CYMBALTA) 30 MG capsule 1 cap PO once daily for 2 weeks, then 1 cap PO QOD x 1 week then stop 18 capsule 0   No current facility-administered medications on file prior to visit.    Allergies  Allergen Reactions   Cephalexin Hives, Nausea Only and Rash    Past Medical History:  Diagnosis Date   Adenoma of right adrenal gland 10/12/2018   Noted on MRI Abd, measuring 2.2 x 2.4 x 2.7 cm  Anxiety    Aortic atherosclerosis ([facility]) 10/10/2018   Noted on [place]   Diabetes mellitus without complication ([facility])    Fatty liver 10/12/2018   Noted on MRI Abd   Fatty liver 09/29/2020   GERD (gastroesophageal reflux disease) 09/16/2015   Mod. noted on KUB   Heart palpitations    High cholesterol    History of chest pain    History of dizziness    History of hiatal hernia 09/16/2015   Small, noted on KUB   History of kidney stones    History of migraine    Hydronephrosis of right kidney 10/12/2018   Mild, noted on MRI Abd   Hyperlipidemia    Left anterior fascicular block (LAFB) 01/10/2013   noted on EKG   Left kidney mass 10/12/2018   Hyperechoic mass mid left kidney measuring 1.0 x 1.0 x 1.2 cm, noted on [place] AB   Nephrolithiasis 10/12/2018   Right, Noted on MRI Abd   Pelvic pain    Chronic, right lower quadrant   Wears contact lenses    Wears glasses     Past Surgical History:  Procedure Laterality Date   ABDOMINAL HYSTERECTOMY     partial   CHOLECYSTECTOMY N/A 11/18/2020   Procedure: LAPAROSCOPIC CHOLECYSTECTOMY;  Surgeon: Abigail Miyamoto, MD;  Location: [facility];  Service: General;  Laterality: N/A;   ENDOMETRIAL ABLATION     Pelvic   HOLMIUM LASER APPLICATION Right 11/15/2018   Procedure: HOLMIUM LASER APPLICATION;  Surgeon: Jerilee Field, MD;  Location[facility];  Service: Urology;  Laterality: Right;   URETEROSCOPY WITH HOLMIUM LASER LITHOTRIPSY Right 11/15/2018   Procedure: CYSTOSCOPY, RETROGRADE, URETEROSCOPY WITH HOLMIUM LASER LITHOTRIPSY/STENT PLACEMENT;  Surgeon: Jerilee Field, MD;  Location: [facility];  Service: Urology;  Laterality: Right;   VAGINAL HYSTERECTOMY  2007   Partial   WISDOM TOOTH EXTRACTION      Social History   Tobacco Use  Smoking Status Never  Smokeless Tobacco Never    Social History   Substance and Sexual Activity  Alcohol Use Yes   Comment: occ    Family History   Problem Relation Age of Onset   Diabetes Mother        Doyce Para (recent diagnosis) causes thrombocytosis   Heart attack Mother 63       died from sepsis/osteomyelitis   Heart disease Mother    Seizures Mother    Dementia Mother    Hypertension Father    COPD Father    Bipolar disorder Father    Parkinson's disease Father    Cervical cancer Sister    Alcohol abuse Brother    COPD Brother    Hyperlipidemia Brother    Hypertension Brother    Stroke Brother    Heart attack Brother    COPD Maternal Grandmother    Hyperlipidemia Maternal Grandmother    Heart attack Maternal Grandmother    Depression Paternal Grandmother    Heart disease Paternal Grandmother    Heart attack Paternal Grandmother    Colon cancer Neg Hx    Esophageal cancer Neg Hx    Stomach cancer Neg Hx    Rectal cancer Neg Hx     Reviw of Systems:  Reviewed in the HPI.  All other systems are negative.   Physical Exam: There were no vitals taken for this visit.  No BP recorded.  {Refresh Note OR Click here to enter BP  :1}***    GEN:  Well nourished, well developed  in no acute distress HEENT: Normal NECK: No JVD; No carotid bruits LYMPHATICS: No lymphadenopathy CARDIAC: RRR ***, no murmurs, rubs, gallops RESPIRATORY:  Clear to auscultation without rales, wheezing or rhonchi  ABDOMEN: Soft, non-tender, non-distended MUSCULOSKELETAL:  No edema; No deformity  SKIN: Warm and dry NEUROLOGIC:  Alert and oriented x 3      ECG:    Assessment / Plan:   1.  Chest discomfort:   -     2.  Hypertension:      3.  Obesity:   has lost a total of 60 lbs.  Wants to discuss stopping wegovy at some point.      4.    Hyperlipidemia:    check labs today . Cont current meds.         Kristeen Miss, MD  06/03/2023 7:01 AM    [facility] [address] [place],  [address] [place], [place]  [address] Pager [phone] Phone: [phone]; Fax: [phone]

## 2023-06-04 ENCOUNTER — Encounter: Payer: Self-pay | Admitting: Cardiovascular Disease

## 2023-06-04 ENCOUNTER — Ambulatory Visit: Payer: 59 | Attending: Cardiovascular Disease | Admitting: Cardiovascular Disease

## 2023-06-04 VITALS — BP 120/76 | HR 83 | Ht 62.0 in | Wt 165.0 lb

## 2023-06-04 DIAGNOSIS — E781 Pure hyperglyceridemia: Secondary | ICD-10-CM

## 2023-06-04 DIAGNOSIS — I10 Essential (primary) hypertension: Secondary | ICD-10-CM

## 2023-06-04 DIAGNOSIS — E669 Obesity, unspecified: Secondary | ICD-10-CM | POA: Diagnosis not present

## 2023-06-04 DIAGNOSIS — R079 Chest pain, unspecified: Secondary | ICD-10-CM

## 2023-06-04 NOTE — Patient Instructions (Signed)
Medication Instructions:  Your physician recommends that you continue on your current medications as directed. Please refer to the Current Medication list given to you today. *If you need a refill on your cardiac medications before your next appointment, please call your pharmacy*   Follow-Up: At [facility], you and your health needs are our priority.  As part of our continuing mission to provide you with exceptional heart care, we have created designated Provider Care Teams.  These Care Teams include your primary Cardiologist (physician) and Advanced Practice Providers (APPs -  Physician Assistants and Nurse Practitioners) who all work together to provide you with the care you need, when you need it.  We recommend signing up for the patient portal called "MyChart".  Sign up information is provided on this After Visit Summary.  MyChart is used to connect with patients for Virtual Visits (Telemedicine).  Patients are able to view lab/test results, encounter notes, upcoming appointments, etc.  Non-urgent messages can be sent to your provider as well.   To learn more about what you can do with MyChart, go to [email or web address].    Your next appointment:   6 month(s)  Provider:   Mertie Moores, MD

## 2023-06-28 ENCOUNTER — Other Ambulatory Visit (HOSPITAL_COMMUNITY): Payer: Self-pay

## 2023-06-28 ENCOUNTER — Other Ambulatory Visit: Payer: Self-pay

## 2023-06-28 DIAGNOSIS — F419 Anxiety disorder, unspecified: Secondary | ICD-10-CM | POA: Diagnosis not present

## 2023-07-04 DIAGNOSIS — F419 Anxiety disorder, unspecified: Secondary | ICD-10-CM | POA: Diagnosis not present

## 2023-07-18 DIAGNOSIS — F4323 Adjustment disorder with mixed anxiety and depressed mood: Secondary | ICD-10-CM | POA: Diagnosis not present

## 2023-07-27 ENCOUNTER — Ambulatory Visit
Admission: EM | Admit: 2023-07-27 | Discharge: 2023-07-27 | Disposition: A | Discharge status: Home / Self Care | Payer: 59 | Attending: Physician Assistant | Admitting: Physician Assistant

## 2023-07-27 ENCOUNTER — Ambulatory Visit: Payer: 59

## 2023-07-27 DIAGNOSIS — M25512 Pain in left shoulder: Secondary | ICD-10-CM | POA: Diagnosis not present

## 2023-07-27 DIAGNOSIS — R079 Chest pain, unspecified: Secondary | ICD-10-CM | POA: Diagnosis not present

## 2023-07-27 DIAGNOSIS — S20212A Contusion of left front wall of thorax, initial encounter: Secondary | ICD-10-CM | POA: Diagnosis not present

## 2023-07-27 DIAGNOSIS — S46912A Strain of unspecified muscle, fascia and tendon at shoulder and upper arm level, left arm, initial encounter: Secondary | ICD-10-CM

## 2023-07-27 MED ORDER — HYDROCODONE-ACETAMINOPHEN 5-325 MG PO TABS: 0.5000 | ORAL_TABLET | Freq: Two times a day (BID) | ORAL | 0 refills | Status: AC | PRN

## 2023-07-27 MED ORDER — IBUPROFEN 800 MG PO TABS
800.0000 mg | ORAL_TABLET | Freq: Once | ORAL | Status: AC
  Administered 2023-07-27: 800 mg via ORAL

## 2023-07-27 MED ORDER — IBUPROFEN 800 MG PO TABS: 800.0000 mg | ORAL_TABLET | Freq: Three times a day (TID) | ORAL | 0 refills | Status: DC
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
## 2023-07-27 NOTE — Discharge Instructions (Addendum)
I am waiting on the radiologist to read your x-rays and we will contact you if this is abnormal and changes our treatment plan.  As we discussed, the safest he needed to go to the emergency room for further evaluation and management.  If anything changes or worsens you must go to the ER immediately as we discussed.  We gave you a dose of ibuprofen in clinic.  I have sent this to the pharmacy and you can use it every 8 hours as needed.  Do not take additional NSAIDs with this medication including aspirin, ibuprofen/Advil, naproxen/Aleve.  Take hydrocodone up to twice a day.  This will make you sleepy so do not drive or drink alcohol taking it.  Follow-up with orthopedics as soon as possible; [facility] has walk-in clinic on Saturdays but if you cannot see them over the weekend please call them and schedule an appointment first thing Monday.  If anything worsens go to the ER.

## 2023-07-31 DIAGNOSIS — S23420A Sprain of sternoclavicular (joint) (ligament), initial encounter: Secondary | ICD-10-CM | POA: Diagnosis not present

## 2023-08-08 DIAGNOSIS — F4323 Adjustment disorder with mixed anxiety and depressed mood: Secondary | ICD-10-CM | POA: Diagnosis not present

## 2023-08-29 DIAGNOSIS — F4323 Adjustment disorder with mixed anxiety and depressed mood: Secondary | ICD-10-CM | POA: Diagnosis not present

## 2023-09-12 DIAGNOSIS — F4323 Adjustment disorder with mixed anxiety and depressed mood: Secondary | ICD-10-CM | POA: Diagnosis not present

## 2023-09-19 ENCOUNTER — Other Ambulatory Visit: Payer: Self-pay

## 2023-09-19 ENCOUNTER — Other Ambulatory Visit: Payer: Self-pay | Admitting: Cardiovascular Disease

## 2023-09-19 ENCOUNTER — Other Ambulatory Visit (HOSPITAL_COMMUNITY): Payer: Self-pay

## 2023-09-20 ENCOUNTER — Other Ambulatory Visit (HOSPITAL_COMMUNITY): Payer: Self-pay

## 2023-09-20 MED ORDER — MOUNJARO 15 MG/0.5ML ~~LOC~~ SOAJ
15.0000 mg | SUBCUTANEOUS | 5 refills | Status: DC
  Filled 2023-09-20: qty 2, 28d supply, fill #0
  Filled 2023-10-16 – 2023-10-24 (×4): qty 2, 28d supply, fill #1
  Filled 2023-11-21: qty 2, 28d supply, fill #2
  Filled 2023-12-18: qty 2, 28d supply, fill #3

## 2023-09-27 DIAGNOSIS — F4323 Adjustment disorder with mixed anxiety and depressed mood: Secondary | ICD-10-CM | POA: Diagnosis not present

## 2023-10-16 ENCOUNTER — Other Ambulatory Visit (HOSPITAL_COMMUNITY): Payer: Self-pay

## 2023-10-17 ENCOUNTER — Other Ambulatory Visit (HOSPITAL_COMMUNITY): Payer: Self-pay

## 2023-10-17 DIAGNOSIS — F4323 Adjustment disorder with mixed anxiety and depressed mood: Secondary | ICD-10-CM | POA: Diagnosis not present

## 2023-10-23 ENCOUNTER — Telehealth: Payer: Self-pay | Admitting: Pharmacist

## 2023-10-23 ENCOUNTER — Other Ambulatory Visit (HOSPITAL_COMMUNITY): Payer: Self-pay

## 2023-10-23 NOTE — Telephone Encounter (Signed)
 Patient needs new PA on Mounjaro. Thanks

## 2023-10-24 ENCOUNTER — Other Ambulatory Visit (HOSPITAL_COMMUNITY): Payer: Self-pay

## 2023-10-24 ENCOUNTER — Telehealth: Payer: Self-pay | Admitting: Pharmacy Technician

## 2023-10-24 NOTE — Telephone Encounter (Signed)
 Pharmacy Patient Advocate Encounter  Received notification from [employer] that Prior Authorization for mounjaro  has been APPROVED from 10/24/23 to 10/23/24. Ran test claim, Copay is $25.00 one month. This test claim was processed through [facility]- copay amounts may vary at other pharmacies due to pharmacy/plan contracts, or as the patient moves through the different stages of their insurance plan.   PA #/Case ID/Reference #: [id number]

## 2023-10-24 NOTE — Telephone Encounter (Signed)
 Pharmacy Patient Advocate Encounter   Received notification from Pt Calls Messages that prior authorization for mounjaro  is required/requested.   Insurance verification completed.   The patient is insured through [facility] .   Per test claim: PA required; PA submitted to above mentioned insurance via CoverMyMeds Key/confirmation #/EOC [id number] Status is pending

## 2023-10-24 NOTE — Telephone Encounter (Signed)
Patient made aware that PA was approved °

## 2023-11-08 DIAGNOSIS — F4323 Adjustment disorder with mixed anxiety and depressed mood: Secondary | ICD-10-CM | POA: Diagnosis not present

## 2023-12-04 DIAGNOSIS — F4323 Adjustment disorder with mixed anxiety and depressed mood: Secondary | ICD-10-CM | POA: Diagnosis not present

## 2023-12-18 ENCOUNTER — Encounter (HOSPITAL_COMMUNITY): Payer: Self-pay

## 2023-12-18 ENCOUNTER — Other Ambulatory Visit (HOSPITAL_COMMUNITY): Payer: Self-pay

## 2023-12-19 DIAGNOSIS — F4323 Adjustment disorder with mixed anxiety and depressed mood: Secondary | ICD-10-CM | POA: Diagnosis not present

## 2023-12-24 ENCOUNTER — Other Ambulatory Visit (HOSPITAL_COMMUNITY): Payer: Self-pay

## 2023-12-28 ENCOUNTER — Telehealth: Payer: Self-pay | Admitting: Family

## 2023-12-28 ENCOUNTER — Encounter: Payer: Self-pay | Admitting: Family

## 2023-12-28 ENCOUNTER — Other Ambulatory Visit (HOSPITAL_BASED_OUTPATIENT_CLINIC_OR_DEPARTMENT_OTHER): Payer: Self-pay

## 2023-12-28 ENCOUNTER — Other Ambulatory Visit (HOSPITAL_COMMUNITY): Payer: Self-pay

## 2023-12-28 ENCOUNTER — Ambulatory Visit: Admitting: Family

## 2023-12-28 VITALS — BP 121/82 | HR 80 | Temp 98.9°F | Resp 16 | Ht 62.0 in | Wt 154.0 lb

## 2023-12-28 DIAGNOSIS — N200 Calculus of kidney: Secondary | ICD-10-CM

## 2023-12-28 DIAGNOSIS — F418 Other specified anxiety disorders: Secondary | ICD-10-CM | POA: Insufficient documentation

## 2023-12-28 DIAGNOSIS — E119 Type 2 diabetes mellitus without complications: Secondary | ICD-10-CM

## 2023-12-28 DIAGNOSIS — Z7985 Long-term (current) use of injectable non-insulin antidiabetic drugs: Secondary | ICD-10-CM | POA: Diagnosis not present

## 2023-12-28 DIAGNOSIS — E785 Hyperlipidemia, unspecified: Secondary | ICD-10-CM | POA: Diagnosis not present

## 2023-12-28 DIAGNOSIS — E663 Overweight: Secondary | ICD-10-CM

## 2023-12-28 DIAGNOSIS — Z6828 Body mass index (BMI) 28.0-28.9, adult: Secondary | ICD-10-CM | POA: Diagnosis not present

## 2023-12-28 DIAGNOSIS — I10 Essential (primary) hypertension: Secondary | ICD-10-CM

## 2023-12-28 DIAGNOSIS — R911 Solitary pulmonary nodule: Secondary | ICD-10-CM | POA: Diagnosis not present

## 2023-12-28 LAB — COMPREHENSIVE METABOLIC PANEL
ALT: 15 U/L (ref 0–35)
AST: 19 U/L (ref 0–37)
Albumin: 4.5 g/dL (ref 3.5–5.2)
Alkaline Phosphatase: 52 U/L (ref 39–117)
BUN: 22 mg/dL (ref 6–23)
CO2: 29 meq/L (ref 19–32)
Calcium: 10 mg/dL (ref 8.4–10.5)
Chloride: 103 meq/L (ref 96–112)
Creatinine, Ser: 1.18 mg/dL (ref 0.40–1.20)
GFR: 53.05 mL/min — ABNORMAL LOW (ref >60.00)
Glucose, Bld: 114 mg/dL — ABNORMAL HIGH (ref 70–99)
Potassium: 4 meq/L (ref 3.5–5.1)
Sodium: 139 meq/L (ref 135–145)
Total Bilirubin: 0.5 mg/dL (ref 0.2–1.2)
Total Protein: 6.9 g/dL (ref 6.0–8.3)

## 2023-12-28 LAB — MICROALBUMIN / CREATININE URINE RATIO
Creatinine,U: 88.9 mg/dL
Microalb Creat Ratio: UNDETERMINED mg/g (ref 0.0–30.0)
Microalb, Ur: <0.7 mg/dL

## 2023-12-28 LAB — LIPID PANEL
Cholesterol: 129 mg/dL (ref 0–200)
HDL: 47.4 mg/dL (ref >39.00)
LDL Cholesterol: 55 mg/dL (ref 0–99)
NonHDL: 81.79
Total CHOL/HDL Ratio: 3
Triglycerides: 134 mg/dL (ref 0.0–149.0)
VLDL: 26.8 mg/dL (ref 0.0–40.0)

## 2023-12-28 LAB — HEMOGLOBIN A1C: Hgb A1c MFr Bld: 5.7 % (ref 4.6–6.5)

## 2023-12-28 MED ORDER — DULOXETINE HCL 30 MG PO CPEP
ORAL_CAPSULE | ORAL | 0 refills | Status: DC
Start: 2023-12-28 — End: 2023-12-28
  Filled 2023-12-28: qty 60, 30d supply, fill #0

## 2023-12-28 MED ORDER — DULOXETINE HCL 30 MG PO CPEP
ORAL_CAPSULE | ORAL | 0 refills | Status: DC
Start: 2023-12-28 — End: 2024-02-21
  Filled 2023-12-28: qty 60, 31d supply, fill #0

## 2023-12-28 NOTE — Assessment & Plan Note (Signed)
 Her cardiologist is currently managing her mounjaro. Will update A1C and urine microalbumin.

## 2023-12-28 NOTE — Assessment & Plan Note (Signed)
 BP Readings from Last 3 Encounters:  12/28/23 121/82  07/27/23 134/89  06/04/23 120/76   BP stable without anti-hypertensives.  Monitor.

## 2023-12-28 NOTE — Assessment & Plan Note (Signed)
 Saw pulmonology and repeat CT showed resolution of lung nodule.

## 2023-12-28 NOTE — Patient Instructions (Signed)
 VISIT SUMMARY:  Today, we reviewed your progress with weight management, medication for hyperlipidemia, and discussed your mental health concerns. You have successfully maintained your weight loss and are considering adjusting your medication. We also discussed your interest in restarting medication for depression and anxiety. Routine health maintenance and follow-up plans were also addressed.  YOUR PLAN:  -WEIGHT MANAGEMENT: You have achieved significant weight loss with Mounjaro and have maintained a stable weight for the past five months. We will continue your current dose of 15 mg and discuss potential tapering with the pharmacist and Dr. Lourena Simmonds.  -TYPE 2 DIABETES MELLITUS: Your diabetes is well-controlled. We will order an A1c and metabolic panel to assess your current status. We are considering discontinuing your statin medication for one month to see the impact of your weight loss on your lipid profile and diabetes control. Please monitor your blood glucose levels closely.  -HYPERLIPIDEMIA: Hyperlipidemia means having high levels of fats in your blood. We will order a lipid panel and consider discontinuing fenofibrate and atorvastatin for one month to see how your lipid levels are affected by your weight loss.  -DEPRESSION AND ANXIETY: Depression and anxiety are mental health conditions that can affect your mood and overall well-being. We will start you on Cymbalta at 30 mg for three days, then increase to 60 mg. The prescription will be sent to your local pharmacy. We will monitor for improvement and any side effects, and coordinate care with your psychiatric nurse practitioner, Orlie Pollen.  -GENERAL HEALTH MAINTENANCE: Routine health maintenance is up to date. You are due for a Pap smear and a urine test for protein. We will request the Pap smear from your gynecologist, Dr. Waynard Reeds, if it was not done last year, and order the urine test for protein.  INSTRUCTIONS:  Please follow  up with lab results and medication adjustments. Coordinate with your psychiatric nurse practitioner for mental health management. Schedule a follow-up appointment as needed.

## 2023-12-28 NOTE — Assessment & Plan Note (Signed)
 Uncontrolled. Pt would like to try cymbalta. She will keep new patient appointment with psychiatry in June, and continue counseling. She will let me know how she is feeling in 1 month.

## 2023-12-28 NOTE — Assessment & Plan Note (Signed)
 She has lost approximately 70 pounds on [facility] which is being prescribed by her cardiologist.  I commended her on her weight loss.

## 2023-12-28 NOTE — Progress Notes (Signed)
 Subjective:     Patient ID: Jane Montes, female    DOB: [birth year], 53 y.o.   MRN: [id number]  Chief Complaint  Patient presents with   Hyperlipidemia    Here for follow up hypertriglyceridemia    HPI  Discussed the use of AI scribe software for clinical note transcription with the patient, who gave verbal consent to proceed.  History of Present Illness Jane Montes is a 53 year old female with obesity and hyperlipidemia who presents for weight management and medication review. She has been referred to Orlie Pollen, a psychiatric nurse practitioner, for further management.  She has successfully lost over 75 pounds and is currently taking Mounjaro at a dose of 15 mg. Her weight has been stable for the past five months, with no further weight loss or gain during this period. She is considering adjusting her medication dosage as she approaches her weight loss goal.  She is currently taking fenofibrate and atorvastatin for hyperlipidemia. She wants to discontinue these medications if her lab results, which have not been updated since last summer, indicate improvement. She is due for an A1c, lipid panel, and metabolic panel.  She is seeking medication for mood management, specifically for symptoms of depression and anxiety. She previously tried Cymbalta five years ago following her father's unexpected death but is unsure if she gave it enough time to be effective. She is awaiting an appointment with a psychiatric nurse practitioner in June but is considering starting medication in the interim.  She experiences tingling in her fingers and is due for a urine test to check for protein.  She mentions her daughters, who have not been seen in over two years and may need to be registered as new patients for their upcoming visits.  Wt Readings from Last 3 Encounters:  12/28/23 154 lb (69.9 kg)  06/04/23 165 lb (74.8 kg)  12/11/22 167 lb 6.4 oz (75.9 kg)       Health Maintenance Due   Topic Date Due   Pneumococcal Vaccine 65-64 Years old (2 of 2 - PPSV23 or PCV20) 03/14/2022   Cervical Cancer Screening (HPV/Pap Cotest)  07/24/2022   Diabetic kidney evaluation - Urine ACR  01/03/2023   HEMOGLOBIN A1C  05/25/2023   COVID-19 Vaccine (3 - 2024-25 season) 06/10/2023   Diabetic kidney evaluation - eGFR measurement  12/18/2023    Past Medical History:  Diagnosis Date   Adenoma of right adrenal gland 10/12/2018   Noted on MRI Abd, measuring 2.2 x 2.4 x 2.7 cm   Anxiety    Aortic atherosclerosis (HCC) 10/10/2018   Noted on [place]   Diabetes mellitus without complication (HCC)    Fatty liver 10/12/2018   Noted on MRI Abd   Fatty liver 09/29/2020   GERD (gastroesophageal reflux disease) 09/16/2015   Mod. noted on KUB   Heart palpitations    High cholesterol    History of chest pain    History of dizziness    History of hiatal hernia 09/16/2015   Small, noted on KUB   History of kidney stones    History of migraine    Hydronephrosis of right kidney 10/12/2018   Mild, noted on MRI Abd   Hyperlipidemia    Left anterior fascicular block (LAFB) 01/10/2013   noted on EKG   Left kidney mass 10/12/2018   Hyperechoic mass mid left kidney measuring 1.0 x 1.0 x 1.2 cm, noted on [place] AB   Nephrolithiasis 10/12/2018   Right, Noted on MRI  Abd   Pelvic pain    Chronic, right lower quadrant   Pyelonephritis 11/23/2022   Syncope 11/24/2022   Wears contact lenses    Wears glasses     Past Surgical History:  Procedure Laterality Date   ABDOMINAL HYSTERECTOMY     partial   CHOLECYSTECTOMY N/A 11/18/2020   Procedure: LAPAROSCOPIC CHOLECYSTECTOMY;  Surgeon: Abigail Miyamoto, MD;  Location: [facility];  Service: General;  Laterality: N/A;   ENDOMETRIAL ABLATION     Pelvic   HOLMIUM LASER APPLICATION Right 11/15/2018   Procedure: HOLMIUM LASER APPLICATION;  Surgeon: Jerilee Field, MD;  Location: [facility];  Service: Urology;  Laterality: Right;    URETEROSCOPY WITH HOLMIUM LASER LITHOTRIPSY Right 11/15/2018   Procedure: CYSTOSCOPY, RETROGRADE, URETEROSCOPY WITH HOLMIUM LASER LITHOTRIPSY/STENT PLACEMENT;  Surgeon: Jerilee Field, MD;  Location: [facility];  Service: Urology;  Laterality: Right;   VAGINAL HYSTERECTOMY  2007   Partial   WISDOM TOOTH EXTRACTION      Family History  Problem Relation Age of Onset   Diabetes Mother        Doyce Para (recent diagnosis) causes thrombocytosis   Heart attack Mother 24       died from sepsis/osteomyelitis   Heart disease Mother    Seizures Mother    Dementia Mother    Hypertension Father    COPD Father    Bipolar disorder Father    Parkinson's disease Father    Cervical cancer Sister    Alcohol abuse Brother    COPD Brother    Hyperlipidemia Brother    Hypertension Brother    Stroke Brother    Heart attack Brother    COPD Maternal Grandmother    Hyperlipidemia Maternal Grandmother    Heart attack Maternal Grandmother    Depression Paternal Grandmother    Heart disease Paternal Grandmother    Heart attack Paternal Grandmother    Colon cancer Neg Hx    Esophageal cancer Neg Hx    Stomach cancer Neg Hx    Rectal cancer Neg Hx     Social History   Socioeconomic History   Marital status: Legally Separated    Spouse name: Not on file   Number of children: Not on file   Years of education: Not on file   Highest education level: Not on file  Occupational History   Occupation: Nurse    Employer: [employer]  Tobacco Use   Smoking status: Never   Smokeless tobacco: Never  Vaping Use   Vaping status: Never Used  Substance and Sexual Activity   Alcohol use: Yes    Comment: occ   Drug use: No   Sexual activity: Not Currently    Birth control/protection: Surgical  Other Topics Concern   Not on file  Social History Narrative   Works for Heart failure team at [employer]   2 daughters   Divorced   Enjoys reading/working out, spending time outdoors.      Social Drivers of Corporate investment banker Strain: Low Risk  (04/02/2018)   Overall Financial Resource Strain (CARDIA)    Difficulty of Paying Living Expenses: Not hard at all  Food Insecurity: No Food Insecurity (11/23/2022)   Hunger Vital Sign    Worried About Running Out of Food in the Last Year: Never true    Ran Out of Food in the Last Year: Never true  Transportation Needs: No Transportation Needs (11/23/2022)   PRAPARE - Transportation    Lack of  Transportation (Medical): No    Lack of Transportation (Non-Medical): No  Physical Activity: Sufficiently Active (04/02/2018)   Exercise Vital Sign    Days of Exercise per Week: 3 days    Minutes of Exercise per Session: 60 min  Stress: Stress Concern Present (04/02/2018)   [employer] of Occupational Health - Occupational Stress Questionnaire    Feeling of Stress : Rather much  Social Connections: Moderately Integrated (04/02/2018)   Social Connection and Isolation Panel [NHANES]    Frequency of Communication with Friends and Family: More than three times a week    Frequency of Social Gatherings with Friends and Family: Three times a week    Attends Religious Services: More than 4 times per year    Active Member of Clubs or Organizations: Yes    Attends Banker Meetings: More than 4 times per year    Marital Status: Separated  Intimate Partner Violence: Not At Risk (11/23/2022)   Humiliation, Afraid, Rape, and Kick questionnaire    Fear of Current or Ex-Partner: No    Emotionally Abused: No    Physically Abused: No    Sexually Abused: No    Outpatient Medications Prior to Visit  Medication Sig Dispense Refill   indapamide (LOZOL) 1.25 MG tablet Take 1 tablet (1.25 mg total) by mouth every morning. 90 tablet 3   tirzepatide (MOUNJARO) 15 MG/0.5ML Pen Inject 15 mg into the skin once a week. 2 mL 5   atorvastatin (LIPITOR) 20 MG tablet Take 1 tablet (20 mg total) by mouth daily. Need office visit. 90 tablet 1    fenofibrate (TRICOR) 145 MG tablet Take 1 tablet (145 mg total) by mouth daily. Need office visit. 90 tablet 1   ibuprofen (ADVIL) 800 MG tablet Take 1 tablet (800 mg total) by mouth 3 (three) times daily. 21 tablet 0   nitroGLYCERIN (NITROSTAT) 0.4 MG SL tablet Take 1 tablet, under your tongue, while sitting.  If no relief of pain may repeat NTG, one tab every 5 minutes up to 3 tablets total over 15 minutes. 25 tablet 3   No facility-administered medications prior to visit.    Allergies  Allergen Reactions   Cephalexin Hives, Nausea Only and Rash    ROS See HPI    Objective:    Physical Exam Constitutional:      General: She is not in acute distress.    Appearance: Normal appearance. She is well-developed.  HENT:     Head: Normocephalic and atraumatic.     Right Ear: External ear normal.     Left Ear: External ear normal.  Eyes:     General: No scleral icterus. Neck:     Thyroid: No thyromegaly.  Cardiovascular:     Rate and Rhythm: Normal rate and regular rhythm.     Heart sounds: Normal heart sounds. No murmur heard. Pulmonary:     Effort: Pulmonary effort is normal. No respiratory distress.     Breath sounds: Normal breath sounds. No wheezing.  Musculoskeletal:     Cervical back: Neck supple.  Skin:    General: Skin is warm and dry.  Neurological:     Mental Status: She is alert and oriented to person, place, and time.  Psychiatric:        Mood and Affect: Mood normal.        Behavior: Behavior normal.        Thought Content: Thought content normal.        Judgment: Judgment normal.  BP 121/82 (BP Location: Right Arm, Patient Position: Sitting, Cuff Size: Normal)   Pulse 80   Temp 98.9 F (37.2 C) (Oral)   Resp 16   Ht 5\' 2"  (1.575 m)   Wt 154 lb (69.9 kg)   SpO2 100%   BMI 28.17 kg/m  Wt Readings from Last 3 Encounters:  12/28/23 154 lb (69.9 kg)  06/04/23 165 lb (74.8 kg)  12/11/22 167 lb 6.4 oz (75.9 kg)       Assessment & Plan:    Problem List Items Addressed This Visit       Unprioritized   Overweight with body mass index (BMI) of 28 to 28.9 in adult   She has lost approximately 70 pounds on Mounjaro which is being prescribed by her cardiologist.  I commended her on her weight loss.       Nephrolithiasis   Continues indapamide for stone prevention.       RESOLVED: Lung nodule   Saw pulmonology and repeat CT showed resolution of lung nodule.       Hypertension   BP Readings from Last 3 Encounters:  12/28/23 121/82  07/27/23 134/89  06/04/23 120/76   BP stable without anti-hypertensives.  Monitor.       Hyperlipidemia   She wishes to come off of fenofibrate and statin since she has had such good weight loss. Will trial off and plan to recheck lipids in 1 month.       Relevant Orders   Lipid panel   Lipid panel   Depression with anxiety   Uncontrolled. Pt would like to try cymbalta. She will keep new patient appointment with psychiatry in June, and continue counseling. She will let me know how she is feeling in 1 month.       Relevant Medications   DULoxetine (CYMBALTA) 30 MG capsule   Controlled type 2 diabetes mellitus without complication, without long-term current use of insulin (HCC) - Primary   Her cardiologist is currently managing her mounjaro. Will update A1C and urine microalbumin.      Relevant Orders   HgB A1c   Comp Met (CMET)   Urine Microalbumin w/creat. ratio    I have discontinued Donna M. Rosado's nitroGLYCERIN, fenofibrate, atorvastatin, and ibuprofen. I am also having her maintain her indapamide, Mounjaro, and DULoxetine.  Meds ordered this encounter  Medications   DISCONTD: DULoxetine (CYMBALTA) 30 MG capsule    Sig: Take 1 capsule (30 mg total) by mouth daily for 3 days, THEN 2 capsules (60 mg total) daily.    Dispense:  60 capsule    Refill:  0    Supervising Provider:   Danise Edge A [[id number]]   DULoxetine (CYMBALTA) 30 MG capsule    Sig: Take 1 capsule (30 mg  total) by mouth daily for 3 days, THEN 2 capsules (60 mg total) daily.    Dispense:  60 capsule    Refill:  0    Supervising Provider:   Danise Edge A [[id number]]

## 2023-12-28 NOTE — Progress Notes (Cosign Needed Addendum)
   Established Patient Office Visit  Subjective   Patient ID: Jane Montes, female    DOB: 07-29-1971  Age: 53 y.o. MRN: [id number]  Chief Complaint  Patient presents with   Hyperlipidemia    Here for follow up hypertriglyceridemia    Hyperlipidemia  Pleasant 53 yo female patient presents to clinic for routine follow-up and medication management. Patient reports losing 75 lbs over the last two years and feels her health is considerably better. She is requesting to decrease/eliminate her lipid-lowering agents.   Patient reports that she has been seeing a behavioral health therapist for 1 year and is experiencing great benefit. She states that her depression and anxiety are relatively well-managed. She has discussed with her therapist the possibility of beginning medication to further improve symptoms. She has an appt to see a provider at [facility] in June to discuss starting medication therapy; however, she would like to begin treatment prior to that appt. She is requesting to restart Cymbalta.  Review of Systems  Constitutional: Negative.   Respiratory: Negative.    Cardiovascular: Negative.       Objective:     BP 121/82 (BP Location: Right Arm, Patient Position: Sitting, Cuff Size: Normal)   Pulse 80   Temp 98.9 F (37.2 C) (Oral)   Resp 16   Ht 5\' 2"  (1.575 m)   Wt 154 lb (69.9 kg)   SpO2 100%   BMI 28.17 kg/m   BP Readings from Last 3 Encounters:  12/28/23 121/82  07/27/23 134/89  06/04/23 120/76     Physical Exam Constitutional:      Appearance: Normal appearance.  HENT:     Head: Normocephalic and atraumatic.     Right Ear: External ear normal.     Left Ear: External ear normal.     Nose: Nose normal.  Cardiovascular:     Rate and Rhythm: Normal rate and regular rhythm.     Pulses: Normal pulses.     Heart sounds: Normal heart sounds.  Pulmonary:     Effort: Pulmonary effort is normal.     Breath sounds: Normal breath sounds.  Skin:     General: Skin is warm and dry.     Capillary Refill: Capillary refill takes less than 2 seconds.  Neurological:     Mental Status: She is alert and oriented to person, place, and time.  Psychiatric:        Mood and Affect: Mood normal.        Behavior: Behavior normal.     Lab Results  Component Value Date   CHOL 146 12/06/2022   HDL 44 12/06/2022   LDLCALC 74 12/06/2022   LDLDIRECT 87.0 03/30/2021   TRIG 161 (H) 12/06/2022   CHOLHDL 3.3 12/06/2022    Lab Results  Component Value Date   HGBA1C 5.4 11/24/2022    The 10-year ASCVD risk score (Arnett DK, et al., 2019) is: 3%    Assessment & Plan:   Hypertension - stable on indapamide.  Type 2 diabetes mellitus - stable on tirzepatide. Will repeat chemistries an HbgA1C today. Diabetic foot exam negative.   Hypertriglyceridemia - patient request to stop lipid lowering agents. Will hold atorvastatin and fenofibrate and bring patient back for fasting lipid panel in 1 month.  Depression and anxiety - improving with therapy, but feels mood could be better. Patient requesting to restart Cymbalta.    Cristopher Peru, RN

## 2023-12-28 NOTE — Telephone Encounter (Signed)
 Please request pap from Dr Waynard Reeds.

## 2023-12-28 NOTE — Assessment & Plan Note (Signed)
 Continues indapamide for stone prevention.

## 2023-12-28 NOTE — Assessment & Plan Note (Signed)
 She wishes to come off of fenofibrate and statin since she has had such good weight loss. Will trial off and plan to recheck lipids in 1 month.

## 2023-12-29 ENCOUNTER — Encounter: Payer: Self-pay | Admitting: Family

## 2023-12-29 DIAGNOSIS — R079 Chest pain, unspecified: Secondary | ICD-10-CM

## 2023-12-29 DIAGNOSIS — E876 Hypokalemia: Secondary | ICD-10-CM

## 2023-12-31 ENCOUNTER — Other Ambulatory Visit (HOSPITAL_COMMUNITY): Payer: Self-pay

## 2023-12-31 DIAGNOSIS — F411 Generalized anxiety disorder: Secondary | ICD-10-CM | POA: Diagnosis not present

## 2023-12-31 DIAGNOSIS — F338 Other recurrent depressive disorders: Secondary | ICD-10-CM | POA: Diagnosis not present

## 2023-12-31 MED ORDER — TRAZODONE HCL 50 MG PO TABS
25.0000 mg | ORAL_TABLET | Freq: Every evening | ORAL | 0 refills | Status: DC | PRN
  Filled 2023-12-31: qty 30, 30d supply, fill #0

## 2024-01-03 NOTE — Telephone Encounter (Signed)
 Electronic request made

## 2024-01-04 DIAGNOSIS — F4323 Adjustment disorder with mixed anxiety and depressed mood: Secondary | ICD-10-CM | POA: Diagnosis not present

## 2024-01-06 ENCOUNTER — Other Ambulatory Visit (HOSPITAL_COMMUNITY): Payer: Self-pay

## 2024-01-07 ENCOUNTER — Encounter (HOSPITAL_COMMUNITY): Payer: Self-pay

## 2024-01-07 ENCOUNTER — Other Ambulatory Visit (HOSPITAL_COMMUNITY): Payer: Self-pay

## 2024-01-14 ENCOUNTER — Emergency Department (HOSPITAL_COMMUNITY)
Admission: EM | Admit: 2024-01-14 | Discharge: 2024-01-14 | Disposition: A | Discharge status: Home / Self Care | Attending: Emergency Medicine | Admitting: Emergency Medicine

## 2024-01-14 ENCOUNTER — Encounter: Payer: Self-pay | Admitting: Cardiovascular Disease

## 2024-01-14 ENCOUNTER — Emergency Department (HOSPITAL_COMMUNITY)

## 2024-01-14 DIAGNOSIS — R0789 Other chest pain: Secondary | ICD-10-CM | POA: Diagnosis not present

## 2024-01-14 DIAGNOSIS — R112 Nausea with vomiting, unspecified: Secondary | ICD-10-CM | POA: Diagnosis not present

## 2024-01-14 DIAGNOSIS — R0989 Other specified symptoms and signs involving the circulatory and respiratory systems: Secondary | ICD-10-CM | POA: Diagnosis not present

## 2024-01-14 DIAGNOSIS — E876 Hypokalemia: Secondary | ICD-10-CM | POA: Insufficient documentation

## 2024-01-14 DIAGNOSIS — R42 Dizziness and giddiness: Secondary | ICD-10-CM | POA: Insufficient documentation

## 2024-01-14 DIAGNOSIS — E119 Type 2 diabetes mellitus without complications: Secondary | ICD-10-CM | POA: Insufficient documentation

## 2024-01-14 DIAGNOSIS — R079 Chest pain, unspecified: Secondary | ICD-10-CM | POA: Diagnosis not present

## 2024-01-14 LAB — COMPREHENSIVE METABOLIC PANEL WITH GFR
ALT: 18 U/L (ref 0–44)
AST: 24 U/L (ref 15–41)
Albumin: 4.1 g/dL (ref 3.5–5.0)
Alkaline Phosphatase: 39 U/L (ref 38–126)
Anion gap: 12 (ref 5–15)
BUN: 18 mg/dL (ref 6–20)
CO2: 24 mmol/L (ref 22–32)
Calcium: 9.9 mg/dL (ref 8.9–10.3)
Chloride: 104 mmol/L (ref 98–111)
Creatinine, Ser: 1.12 mg/dL — ABNORMAL HIGH (ref 0.44–1.00)
GFR, Estimated: 59 mL/min — ABNORMAL LOW (ref >60)
Glucose, Bld: 87 mg/dL (ref 70–99)
Potassium: 3.2 mmol/L — ABNORMAL LOW (ref 3.5–5.1)
Sodium: 140 mmol/L (ref 135–145)
Total Bilirubin: 0.5 mg/dL (ref 0.0–1.2)
Total Protein: 7.1 g/dL (ref 6.5–8.1)

## 2024-01-14 LAB — CBC WITH DIFFERENTIAL/PLATELET
Abs Immature Granulocytes: 0.03 10*3/uL (ref 0.00–0.07)
Basophils Absolute: 0.1 10*3/uL (ref 0.0–0.1)
Basophils Relative: 1 %
Eosinophils Absolute: 0.1 10*3/uL (ref 0.0–0.5)
Eosinophils Relative: 1 %
HCT: 38.5 % (ref 36.0–46.0)
Hemoglobin: 13.1 g/dL (ref 12.0–15.0)
Immature Granulocytes: 0 %
Lymphocytes Relative: 26 %
Lymphs Abs: 1.8 10*3/uL (ref 0.7–4.0)
MCH: 30 pg (ref 26.0–34.0)
MCHC: 34 g/dL (ref 30.0–36.0)
MCV: 88.1 fL (ref 80.0–100.0)
Monocytes Absolute: 0.5 10*3/uL (ref 0.1–1.0)
Monocytes Relative: 7 %
Neutro Abs: 4.4 10*3/uL (ref 1.7–7.7)
Neutrophils Relative %: 65 %
Platelets: 373 10*3/uL (ref 150–400)
RBC: 4.37 MIL/uL (ref 3.87–5.11)
RDW: 12.3 % (ref 11.5–15.5)
WBC: 6.9 10*3/uL (ref 4.0–10.5)
nRBC: 0 % (ref 0.0–0.2)

## 2024-01-14 LAB — TROPONIN I (HIGH SENSITIVITY)
Troponin I (High Sensitivity): <2 ng/L (ref <18)
Troponin I (High Sensitivity): <2 ng/L (ref <18)

## 2024-01-14 LAB — CBG MONITORING, ED: Glucose-Capillary: 86 mg/dL (ref 70–99)

## 2024-01-14 MED ORDER — POTASSIUM CHLORIDE CRYS ER 20 MEQ PO TBCR
40.0000 meq | EXTENDED_RELEASE_TABLET | Freq: Once | ORAL | Status: AC
  Administered 2024-01-14: 40 meq via ORAL
  Filled 2024-01-14: qty 2

## 2024-01-14 MED ORDER — ONDANSETRON HCL 4 MG/2ML IJ SOLN
4.0000 mg | Freq: Once | INTRAMUSCULAR | Status: AC
  Administered 2024-01-14: 4 mg via INTRAVENOUS
  Filled 2024-01-14: qty 2

## 2024-01-14 NOTE — ED Provider Notes (Signed)
 [place] EMERGENCY DEPARTMENT AT [facility] Provider Note   CSN: [id number] Arrival date & time: 01/14/24  0844     History  Chief Complaint  Patient presents with   Chest Pain   Dizziness    Jane Montes is a 53 y.o. female.  Patient is a 53 year old female with a past medical history of diabetes presenting to the emergency department with chest pain and dizziness.  Patient states that yesterday she was having some dizziness that she describes as lightheadedness with some mild nausea and just did not feel well like her normal self.  She states that she still felt unwell when she got up this morning but still tried to drive into work.  She states that on her drive she had worsening dizziness and had some associated room spinning type of dizziness.  She states that she checked her Apple Watch and her heart rate dropped from 90s to 59 and she became very nauseous.  She states that she pulled over for a few minutes until her symptoms improved enough and she was able to drive the rest of the way to work.  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  She states that she started to have vomiting and dry heaving on her arrival to the ER.  She states that this morning on her drive and she also started to develop some mild chest pressure.  She states that it radiates into her jaw.  She denies any shortness of breath.  She denies any recent fevers or illnesses.  She states that she does have a significant family history of CAD.  The history is provided by the patient.  Chest Pain Associated symptoms: dizziness   Dizziness Associated symptoms: chest pain        Home Medications Prior to Admission medications   Medication Sig Start Date End Date Taking? Authorizing Provider  DULoxetine (CYMBALTA) 30 MG capsule Take 1 capsule (30 mg total) by mouth daily for  3 days, THEN 2 capsules (60 mg total) daily. 12/28/23 02/26/24  Sandford Craze, NP  indapamide (LOZOL) 1.25 MG tablet Take 1 tablet (1.25 mg total) by mouth every morning. 12/26/22     tirzepatide (MOUNJARO) 15 MG/0.5ML Pen Inject 15 mg into the skin once a week. 09/20/23   Nahser, Deloris Ping, MD  traZODone (DESYREL) 50 MG tablet Take 0.5-1 tablets (25-50 mg total) by mouth at bedtime as needed for sleep. 12/31/23         Allergies    Cephalexin    Review of Systems   Review of Systems  Cardiovascular:  Positive for chest pain.  Neurological:  Positive for dizziness.    Physical Exam Updated Vital Signs BP 125/82   Pulse 72   Temp 98.1 F (36.7 C) (Oral)   Resp 14   Ht 5\' 2"  (1.575 m)   Wt 67.1 kg   SpO2 100%   BMI 27.07 kg/m  Physical Exam Vitals and nursing note reviewed.  Constitutional:      General: She is not in acute distress.    Appearance: She is well-developed.  HENT:     Head: Normocephalic and atraumatic.  Eyes:     Extraocular Movements: Extraocular movements intact.     Pupils: Pupils are equal, round, and reactive to light.     Comments: No nystagmus  Cardiovascular:  Rate and Rhythm: Normal rate and regular rhythm.     Heart sounds: Normal heart sounds.  Pulmonary:     Effort: Pulmonary effort is normal.     Breath sounds: Normal breath sounds.  Abdominal:     Palpations: Abdomen is soft.     Tenderness: There is no abdominal tenderness.  Musculoskeletal:        General: Normal range of motion.     Cervical back: Normal range of motion and neck supple.     Right lower leg: No edema.     Left lower leg: No edema.  Skin:    General: Skin is warm and dry.  Neurological:     General: No focal deficit present.     Mental Status: She is alert and oriented to person, place, and time.     Cranial Nerves: No cranial nerve deficit.     Motor: No weakness.     Comments: Normal finger-to-nose bilaterally Normal speech Sensation intact  Psychiatric:         Mood and Affect: Mood normal.        Behavior: Behavior normal.     ED Results / Procedures / Treatments   Labs (all labs ordered are listed, but only abnormal results are displayed) Labs Reviewed  COMPREHENSIVE METABOLIC PANEL WITH GFR - Abnormal; Notable for the following components:      Result Value   Potassium 3.2 (*)    Creatinine, Ser 1.12 (*)    GFR, Estimated 59 (*)    All other components within normal limits  CBC WITH DIFFERENTIAL/PLATELET  CBG MONITORING, ED  TROPONIN I (HIGH SENSITIVITY)  TROPONIN I (HIGH SENSITIVITY)    EKG EKG Interpretation Date/Time:  Monday January 14 2024 08:56:13 EDT Ventricular Rate:  85 PR Interval:  158 QRS Duration:  88 QT Interval:  364 QTC Calculation: 433 R Axis:   -52  Text Interpretation: Normal sinus rhythm Left anterior fascicular block Nonspecific T wave abnormality Abnormal ECG  Nonspecific T wave abnormality inferiorly compared to prior EKG Confirmed by Elayne Snare ([id number]) on 01/14/2024 9:04:06 AM  Radiology DG Chest 2 View Result Date: 01/14/2024 CLINICAL DATA:  Chest pain. EXAM: CHEST - 2 VIEW COMPARISON:  07/27/2023. FINDINGS: Low lung volume. Bilateral lung [place] are clear. Bilateral costophrenic angles are clear. Normal cardio-mediastinal silhouette. No acute osseous abnormalities. The soft tissues are within normal limits. IMPRESSION: No active cardiopulmonary disease. Electronically Signed   By: Jules Schick M.D.   On: 01/14/2024 10:31    Procedures Procedures    Medications Ordered in ED Medications  ondansetron (ZOFRAN) injection 4 mg (4 mg Intravenous Given 01/14/24 1007)  potassium chloride SA (KLOR-CON M) CR tablet 40 mEq (40 mEq Oral Given 01/14/24 1203)    ED Course/ Medical Decision Making/ A&P Clinical Course as of 01/14/24 1419  Mon Jan 14, 2024  1052 Dizziness upon standing, otherwise orthostatics negative.  [VK]  1154 Mild hypokalemia, will be repleted. Initial troponin negative. Symptoms  starting just prior to arrival so will need repeat troponin. [VK]  1253 Repeat troponin negative. Work up overall reassuring. Recommend outpatient cardiology follow up. [VK]    Clinical Course User Index [VK] Rexford Maus, DO                                 Medical Decision Making This patient presents to the ED with chief complaint(s) of dizziness, chest pain with pertinent past  medical history of diabetes which further complicates the presenting complaint. The complaint involves an extensive differential diagnosis and also carries with it a high risk of complications and morbidity.    The differential diagnosis includes ACS, arrhythmia, anemia, dehydration, electrolyte abnormality, pneumonia, pneumothorax, pulmonary edema, pleural effusion, orthostatic hypotension, history and exam does not appear consistent with vertigo  Additional history obtained: Additional history obtained from N/a Records reviewed Primary Care Documents and outpatient cardiology records  ED Course and Reassessment: On patient's arrival she is hemodynamically stable in no acute distress.  EKG on arrival showed normal sinus rhythm with nonspecific T wave changes.  Patient will have labs including troponin and electrolytes.  Will be given Zofran for symptomatic management and will have orthostatics performed.  She will be monitored on telemetry and will be closely reassessed.  Independent labs interpretation:  The following labs were independently interpreted: mild hypokalemia, otherwise within normal rnage  Independent visualization of imaging: - I independently visualized the following imaging with scope of interpretation limited to determining acute life threatening conditions related to emergency care: CXR, which revealed no acute disease  Consultation: - Consulted or discussed management/test interpretation w/ external professional: N/A  Consideration for admission or further workup: Patient has no  emergent conditions requiring admission or further work-up at this time and is stable for discharge home with primary care and cardiology follow-up  Social Determinants of health: N/A    Amount and/or Complexity of Data Reviewed Labs: ordered. Radiology: ordered.  Risk Prescription drug management.          Final Clinical Impression(s) / ED Diagnoses Final diagnoses:  Dizziness  Atypical chest pain  Nausea and vomiting, unspecified vomiting type    Rx / DC Orders ED Discharge Orders     None         Rexford Maus, DO 01/14/24 1419

## 2024-01-14 NOTE — Discharge Instructions (Signed)
 You were seen in the emergency department for your chest pain and your dizziness.  Your workup showed no signs of heart attack or abnormal heart rhythm.  Your potassium level is mildly low.  It is unclear what is causing your symptoms at this time but you should follow-up with your cardiologist tomorrow as scheduled for further workup and evaluation.  You should return to the emergency department to be worsening dizziness and pass out, worsening chest pain, severe shortness of breath or any other new or concerning symptoms.

## 2024-01-14 NOTE — ED Notes (Signed)
 Call to CCMD

## 2024-01-14 NOTE — ED Triage Notes (Signed)
 Pt states that over the weekend she began having dizzy spells and R sided CP. Pt states that on her way into work she began having dizzy spell and looked at her watch and saw her HR drop from 90's to 50's. Pt then had episode of emesis on arrival to work. Pt states pain radiates into her back. Pt also states she has strong family hx of cardiac disease.

## 2024-01-14 NOTE — Progress Notes (Unsigned)
 Jane Montes Date of Birth  April 20, 1971       [facility]      [address]      [place], [place]  [address]     [phone]        Fax  [phone]       Problem List: 1. Palpitations 2. Hyperlipidemia 3. Dizziness    Previous notes.   Jane Montes is  a 53 year old female who presents today for followup of her palpitations and episodes of presyncope. She also is noted that her blood pressures been elevated. Is been under lots of stress recently. She's going through a separation. She's been exercising quite a bit and has been lifting a lot of weights. She is on a Paleo diet and has lost 28 pounds.  She's noticed lots of episodes of palpitations and lightheadedness. These primarily occur after she's been weight lifting. She lifts 280 pounds in a dead lift and this typically causes her to be dizzy and see "white spots".  She also has had some palpitations. These occurred at times are not necessarily related he any specific activity.  January 09, 2013:  She has continued to have dizziness and " white spots" in her vision.  She has been seen in the ER.   When she was on her way into the ER she had an  episode of CP / pressure.  Radiation to her right arm.  The pain was intermitant for hours.  Troponins were negtive, head CT was normal.  She has these episodes at work - she has hooked herself up to the monitor and has normal rhythm.  Her BP has also been normal during these episodes.    She broke her foot in January, 2014.  She had previously been doing lots of cardio exercise but has had to back off schedule.  She has continued to do her Cross-Fit workouts without chest pain.   We did a stress test years ago  Sept. 16, 2019:  [name] is seen back after a 5 year absence Has had some stress - going through a separatin and divorce.  Has heart burn. Has a small hiatal hernia   Has been stress eating ,  Has gained 25 lbs.    Lifts weights, spin classes,  Kick boxing   3-5 days a week   No CP with exercise.  Has had some vague CP ( occurred after an argument )  Pain was mid sternal ,   Slightly to the left  Lasted 30-40 min No CP or pressured with her work outs .   Mother had her first MI at age 3.     Feb 13, 2020:  [name] is seen today for follow up of her HTN and hyperlipidemia  She has had kidney stones since her last visit Her triglyceride level has been as high as 737.  Her last triglyceride level on January 19, 2020 is 149. On lipitor, tricor, omega 3 fatty acids  We did a coronary CT angiogram in October, 2019.  She has a coronary calcium score of 0.  She had no plaque in any of her coronary arteries.   Her brother Cherre Blanc died of a MI at 28 Wt is 197 lbs.  No angina  Has had some anxiety issues - started on cymbalt  Is exercising .   Spin class, walking , zoomba  Nov. 15, 2022: Jane Montes is seen today for follow up visit of her chest pain and  HLD  Wt is 214 lbs. ( Up 17 lbs from last year )   Has had some chest pain , radiates to jaw May last for 5-10 min As long as 30 min Not associated with exertion  Has had more leg swelling ,   Takes OTC diuretics  Rides stationary bike twice a week. Avoids salt .   Bp has been borderline elevated  Coronary CT angiogram in October, 2019 revealed a coronary calcium score of 0 and no coronary atherosclerosis.  She has occasional episodes of "feeling off "  in the middle of the day .  She is unsure if it is a BP issue or blood glucose issue  BP has tended to run higher over the past several months.   Likely due to her weight gain   Feb. 17, 2023 Jane Montes is seen for follow up of some episodes of CP, HLD Coronary CT angio: from Oct. 2019 showed a CAC score of 0 with normal coronaries  She has been on   Semaglutide once a week which has helped her lose quite a bit of weight Wt is 205 lbs - down 9 lbs from last visit  We   December 06, 2022: Jane Montes  is seen today for follow-up visit. Wt is  168 She has been on Wegovy and has lost  40 lbs over the past year   Was just in the hospital for syncope , fever Was found to have pyelonephritis / sepsis   Was incidentally found to have a RUL pulmonary nodule ( 11 mm x 7 mm)  No pulmonary symptoms     Aug. 26, 2024 Jane Montes is seen for follow up of her HLD, her CAC score is 0  Hx of obesity Wt is 165  Leaving for [place] / [place]  tomorrow  Glucoses have been great Now on Mounjaro   3 weeks ago  Started having sudden pain under her left breast  No dyspnea. Pain is slightly worse with deep breath No related to eating, not related to exercise   Has occurred 7-8 times since then  Dealing with stress  Rides her peleton twice a week  Has not pulled a muscle    January 15, 2024: Jane Montes is seen today for follow-up visit.  She has had some unusual chest pain on the left side of her chest.  Seems to be slightly worse with a deep breath.   Current Outpatient Medications on File Prior to Visit  Medication Sig Dispense Refill   DULoxetine (CYMBALTA) 30 MG capsule Take 1 capsule (30 mg total) by mouth daily for 3 days, THEN 2 capsules (60 mg total) daily. 60 capsule 0   indapamide (LOZOL) 1.25 MG tablet Take 1 tablet (1.25 mg total) by mouth every morning. 90 tablet 3   tirzepatide (MOUNJARO) 15 MG/0.5ML Pen Inject 15 mg into the skin once a week. 2 mL 5   traZODone (DESYREL) 50 MG tablet Take 0.5-1 tablets (25-50 mg total) by mouth at bedtime as needed for sleep. 30 tablet 0   No current facility-administered medications on file prior to visit.    Allergies  Allergen Reactions   Cephalexin Hives, Nausea Only and Rash    Past Medical History:  Diagnosis Date   Adenoma of right adrenal gland 10/12/2018   Noted on MRI Abd, measuring 2.2 x 2.4 x 2.7 cm   Anxiety    Aortic atherosclerosis (HCC) 10/10/2018   Noted on [place]   Diabetes mellitus without complication (HCC)  Fatty liver 10/12/2018   Noted on MRI Abd   Fatty liver  09/29/2020   GERD (gastroesophageal reflux disease) 09/16/2015   Mod. noted on KUB   Heart palpitations    High cholesterol    History of chest pain    History of dizziness    History of hiatal hernia 09/16/2015   Small, noted on KUB   History of kidney stones    History of migraine    Hydronephrosis of right kidney 10/12/2018   Mild, noted on MRI Abd   Hyperlipidemia    Left anterior fascicular block (LAFB) 01/10/2013   noted on EKG   Left kidney mass 10/12/2018   Hyperechoic mass mid left kidney measuring 1.0 x 1.0 x 1.2 cm, noted on [place] AB   Nephrolithiasis 10/12/2018   Right, Noted on MRI Abd   Pelvic pain    Chronic, right lower quadrant   Pyelonephritis 11/23/2022   Syncope 11/24/2022   Wears contact lenses    Wears glasses     Past Surgical History:  Procedure Laterality Date   ABDOMINAL HYSTERECTOMY     partial   CHOLECYSTECTOMY N/A 11/18/2020   Procedure: LAPAROSCOPIC CHOLECYSTECTOMY;  Surgeon: Abigail Miyamoto, MD;  Location: [facility];  Service: General;  Laterality: N/A;   ENDOMETRIAL ABLATION     Pelvic   HOLMIUM LASER APPLICATION Right 11/15/2018   Procedure: HOLMIUM LASER APPLICATION;  Surgeon: Jerilee Field, MD;  Location: [facility];  Service: Urology;  Laterality: Right;   URETEROSCOPY WITH HOLMIUM LASER LITHOTRIPSY Right 11/15/2018   Procedure: CYSTOSCOPY, RETROGRADE, URETEROSCOPY WITH HOLMIUM LASER LITHOTRIPSY/STENT PLACEMENT;  Surgeon: Jerilee Field, MD;  Location: [facility];  Service: Urology;  Laterality: Right;   VAGINAL HYSTERECTOMY  2007   Partial   WISDOM TOOTH EXTRACTION      Social History   Tobacco Use  Smoking Status Never  Smokeless Tobacco Never    Social History   Substance and Sexual Activity  Alcohol Use Yes   Comment: occ    Family History  Problem Relation Age of Onset   Diabetes Mother        Doyce Para (recent diagnosis) causes thrombocytosis   Heart attack  Mother 63       died from sepsis/osteomyelitis   Heart disease Mother    Seizures Mother    Dementia Mother    Hypertension Father    COPD Father    Bipolar disorder Father    Parkinson's disease Father    Cervical cancer Sister    Alcohol abuse Brother    COPD Brother    Hyperlipidemia Brother    Hypertension Brother    Stroke Brother    Heart attack Brother    COPD Maternal Grandmother    Hyperlipidemia Maternal Grandmother    Heart attack Maternal Grandmother    Depression Paternal Grandmother    Heart disease Paternal Grandmother    Heart attack Paternal Grandmother    Colon cancer Neg Hx    Esophageal cancer Neg Hx    Stomach cancer Neg Hx    Rectal cancer Neg Hx     Reviw of Systems:  Reviewed in the HPI.  All other systems are negative.    Physical Exam: There were no vitals taken for this visit.       GEN:  Well nourished, well developed in no acute distress HEENT: Normal NECK: No JVD; No carotid bruits LYMPHATICS: No lymphadenopathy CARDIAC: RRR ***, no murmurs, rubs, gallops RESPIRATORY:  Clear  to auscultation without rales, wheezing or rhonchi  ABDOMEN: Soft, non-tender, non-distended MUSCULOSKELETAL:  No edema; No deformity  SKIN: Warm and dry NEUROLOGIC:  Alert and oriented x 3   ECG:          Assessment / Plan:   1.  Chest discomfort:   -   has some atypical CP .  Left sided pleuretic like CP just below her left breast .      2.  Hypertension:       3.  Obesity:       4.    Hyperlipidemia:     .          Kristeen Miss, MD  01/14/2024 6:04 PM    [facility] [address] [place],  [address] [place], [place]  [address] Pager [phone] Phone: [phone]; Fax: [phone]

## 2024-01-14 NOTE — ED Notes (Signed)
 Pt advises me that she had an episode of dizziness at 11:55.  Monitoring reviewed, no alarms

## 2024-01-15 ENCOUNTER — Other Ambulatory Visit (HOSPITAL_COMMUNITY): Payer: Self-pay

## 2024-01-15 ENCOUNTER — Ambulatory Visit: Attending: Cardiology | Admitting: Cardiovascular Disease

## 2024-01-15 ENCOUNTER — Encounter: Payer: Self-pay | Admitting: Cardiovascular Disease

## 2024-01-15 VITALS — BP 122/76 | HR 79 | Ht 62.0 in | Wt 152.0 lb

## 2024-01-15 DIAGNOSIS — E782 Mixed hyperlipidemia: Secondary | ICD-10-CM

## 2024-01-15 MED ORDER — MOUNJARO 10 MG/0.5ML ~~LOC~~ SOAJ
10.0000 mg | SUBCUTANEOUS | 11 refills | Status: DC
  Filled 2024-01-15 – 2024-01-18 (×2): qty 2, 28d supply, fill #0
  Filled 2024-02-11: qty 2, 28d supply, fill #1
  Filled 2024-03-11: qty 2, 28d supply, fill #2
  Filled 2024-04-07: qty 2, 28d supply, fill #3
  Filled 2024-05-05: qty 2, 28d supply, fill #4
  Filled 2024-06-09: qty 2, 28d supply, fill #5
  Filled 2024-07-14: qty 2, 28d supply, fill #6

## 2024-01-15 NOTE — Patient Instructions (Addendum)
 Medication Instructions:  Your physician recommends that you continue on your current medications as directed. Please refer to the Current Medication list given to you today.  *If you need a refill on your cardiac medications before your next appointment, please call your pharmacy*  Lab Work: None ordered today. If you have labs (blood work) drawn today and your tests are completely normal, you will receive your results only by: MyChart Message (if you have MyChart) OR A paper copy in the mail If you have any lab test that is abnormal or we need to change your treatment, we will call you to review the results.  Testing/Procedures: None ordered today.  Follow-Up: At [facility], you and your health needs are our priority.  As part of our continuing mission to provide you with exceptional heart care, we have created designated Provider Care Teams.  These Care Teams include your primary Cardiologist (physician) and Advanced Practice Providers (APPs -  Physician Assistants and Nurse Practitioners) who all work together to provide you with the care you need, when you need it.   Your next appointment:   February 05, 2024 at 1:20 PM  The format for your next appointment:   In Person  Provider:   Available Provider  {  Other Instructions   1st Floor: - Lobby - Registration  - Pharmacy  - Lab - Cafe  2nd Floor: - PV Lab - Diagnostic Testing (echo, CT, nuclear med)  3rd Floor: - Vacant  4th Floor: - TCTS (cardiothoracic surgery) - AFib Clinic - Structural Heart Clinic - Vascular Surgery  - Vascular Ultrasound  5th Floor: - HeartCare Cardiology (general and EP) - Clinical Pharmacy for coumadin, hypertension, lipid, weight-loss medications, and med management appointments    Valet parking services will be available as well.

## 2024-01-18 ENCOUNTER — Other Ambulatory Visit (HOSPITAL_COMMUNITY): Payer: Self-pay

## 2024-01-22 ENCOUNTER — Other Ambulatory Visit (HOSPITAL_COMMUNITY): Payer: Self-pay

## 2024-01-22 MED ORDER — INDAPAMIDE 1.25 MG PO TABS
1.2500 mg | ORAL_TABLET | Freq: Every morning | ORAL | 0 refills | Status: DC
  Filled 2024-01-22: qty 90, 90d supply, fill #0

## 2024-01-24 ENCOUNTER — Other Ambulatory Visit (HOSPITAL_COMMUNITY): Payer: Self-pay

## 2024-01-24 DIAGNOSIS — N200 Calculus of kidney: Secondary | ICD-10-CM | POA: Diagnosis not present

## 2024-01-24 MED ORDER — INDAPAMIDE 1.25 MG PO TABS
1.2500 mg | ORAL_TABLET | Freq: Every morning | ORAL | 3 refills | Status: DC
  Filled 2024-01-24 – 2024-04-20 (×2): qty 90, 90d supply, fill #0
  Filled 2024-07-14: qty 90, 90d supply, fill #1
  Filled 2024-09-25: qty 90, 90d supply, fill #2
  Filled ????-??-??: fill #2

## 2024-01-29 ENCOUNTER — Other Ambulatory Visit (HOSPITAL_COMMUNITY): Payer: Self-pay

## 2024-01-29 ENCOUNTER — Other Ambulatory Visit (INDEPENDENT_AMBULATORY_CARE_PROVIDER_SITE_OTHER)

## 2024-01-29 DIAGNOSIS — F411 Generalized anxiety disorder: Secondary | ICD-10-CM | POA: Diagnosis not present

## 2024-01-29 DIAGNOSIS — E876 Hypokalemia: Secondary | ICD-10-CM | POA: Diagnosis not present

## 2024-01-29 DIAGNOSIS — E785 Hyperlipidemia, unspecified: Secondary | ICD-10-CM | POA: Diagnosis not present

## 2024-01-29 DIAGNOSIS — R079 Chest pain, unspecified: Secondary | ICD-10-CM

## 2024-01-29 DIAGNOSIS — F338 Other recurrent depressive disorders: Secondary | ICD-10-CM | POA: Diagnosis not present

## 2024-01-29 LAB — LIPID PANEL
Cholesterol: 188 mg/dL (ref 0–200)
HDL: 44.7 mg/dL (ref >39.00)
LDL Cholesterol: 87 mg/dL (ref 0–99)
NonHDL: 143.77
Total CHOL/HDL Ratio: 4
Triglycerides: 283 mg/dL — ABNORMAL HIGH (ref 0.0–149.0)
VLDL: 56.6 mg/dL — ABNORMAL HIGH (ref 0.0–40.0)

## 2024-01-29 LAB — VITAMIN D 25 HYDROXY (VIT D DEFICIENCY, FRACTURES): VITD: 68.1 ng/mL (ref 30.00–100.00)

## 2024-01-29 LAB — BASIC METABOLIC PANEL WITH GFR
BUN: 23 mg/dL (ref 6–23)
CO2: 28 meq/L (ref 19–32)
Calcium: 10 mg/dL (ref 8.4–10.5)
Chloride: 106 meq/L (ref 96–112)
Creatinine, Ser: 1.12 mg/dL (ref 0.40–1.20)
GFR: 56.44 mL/min — ABNORMAL LOW (ref >60.00)
Glucose, Bld: 83 mg/dL (ref 70–99)
Potassium: 4 meq/L (ref 3.5–5.1)
Sodium: 141 meq/L (ref 135–145)

## 2024-01-29 MED ORDER — DULOXETINE HCL 60 MG PO CPEP
60.0000 mg | ORAL_CAPSULE | Freq: Every day | ORAL | 1 refills | Status: DC
Start: 2024-01-29 — End: 2024-03-04
  Filled 2024-01-29: qty 30, 30d supply, fill #0
  Filled 2024-02-21 – 2024-02-22 (×3): qty 30, 30d supply, fill #1

## 2024-01-29 MED ORDER — TRAZODONE HCL 50 MG PO TABS
25.0000 mg | ORAL_TABLET | Freq: Every evening | ORAL | 1 refills | Status: AC | PRN
  Filled 2024-01-29: qty 30, 30d supply, fill #0
  Filled 2024-03-04: qty 30, 30d supply, fill #1

## 2024-02-01 DIAGNOSIS — F4323 Adjustment disorder with mixed anxiety and depressed mood: Secondary | ICD-10-CM | POA: Diagnosis not present

## 2024-02-01 LAB — LIPOPROTEIN A (LPA): Lipoprotein (a): 228 nmol/L — ABNORMAL HIGH (ref <75)

## 2024-02-03 ENCOUNTER — Encounter: Payer: Self-pay | Admitting: Family

## 2024-02-04 NOTE — Progress Notes (Unsigned)
 Jane Montes Stable Ey Date of Birth  02-03-1971       [facility]      [address]      [place], [place]  [address]     [phone]        Fax  [phone]       Problem List: 1. Palpitations 2. Hyperlipidemia 3. Dizziness    Previous notes.   Jane Montes is  a 53 year old female who presents today for followup of her palpitations and episodes of presyncope. She also is noted that her blood pressures been elevated. Is been under lots of stress recently. She's going through a separation. She's been exercising quite a bit and has been lifting a lot of weights. She is on a Paleo diet and has lost 28 pounds.  She's noticed lots of episodes of palpitations and lightheadedness. These primarily occur after she's been weight lifting. She lifts 280 pounds in a dead lift and this typically causes her to be dizzy and see "white spots".  She also has had some palpitations. These occurred at times are not necessarily related he any specific activity.  January 09, 2013:  She has continued to have dizziness and " white spots" in her vision.  She has been seen in the ER.   When she was on her way into the ER she had an  episode of CP / pressure.  Radiation to her right arm.  The pain was intermitant for hours.  Troponins were negtive, head CT was normal.  She has these episodes at work - she has hooked herself up to the monitor and has normal rhythm.  Her BP has also been normal during these episodes.    She broke her foot in January, 2014.  She had previously been doing lots of cardio exercise but has had to back off schedule.  She has continued to do her Cross-Fit workouts without chest pain.   We did a stress test years ago  Sept. 16, 2019:  Jane Montes is seen back after a 5 year absence Has had some stress - going through a separatin and divorce.  Has heart burn. Has a small hiatal hernia   Has been stress eating ,  Has gained 25 lbs.    Lifts weights, spin classes,  Kick boxing   3-5 days a week   No CP with exercise.  Has had some vague CP ( occurred after an argument )  Pain was mid sternal ,   Slightly to the left  Lasted 30-40 min No CP or pressured with her work outs .   Mother had her first MI at age 40.     Feb 13, 2020:  Jane Montes is seen today for follow up of her HTN and hyperlipidemia  She has had kidney stones since her last visit Her triglyceride level has been as high as 737.  Her last triglyceride level on January 19, 2020 is 149. On lipitor, tricor , omega 3 fatty acids  We did a coronary CT angiogram in October, 2019.  She has a coronary calcium  score of 0.  She had no plaque in any of her coronary arteries.   Her brother Bart Born died of a MI at 53 Wt is 197 lbs.  No angina  Has had some anxiety issues - started on cymbalt  Is exercising .   Spin class, walking , zoomba  Nov. 15, 2022: Jane Montes is seen today for follow up visit of her chest pain and  HLD  Wt is 214 lbs. ( Up 17 lbs from last year )   Has had some chest pain , radiates to jaw May last for 5-10 min As long as 30 min Not associated with exertion  Has had more leg swelling ,   Takes OTC diuretics  Rides stationary bike twice a week. Avoids salt .   Bp has been borderline elevated  Coronary CT angiogram in October, 2019 revealed a coronary calcium  score of 0 and no coronary atherosclerosis.  She has occasional episodes of "feeling off "  in the middle of the day .  She is unsure if it is a BP issue or blood glucose issue  BP has tended to run higher over the past several months.   Likely due to her weight gain   Feb. 17, 2023 Jane Montes is seen for follow up of some episodes of CP, HLD Coronary CT angio: from Oct. 2019 showed a CAC score of 0 with normal coronaries  She has been on   Semaglutide  once a week which has helped her lose quite a bit of weight Wt is 205 lbs - down 9 lbs from last visit  We   December 06, 2022: Jane Montes  is seen today for follow-up visit. Wt is  168 She has been on Wegovy  and has lost  40 lbs over the past year   Was just in the hospital for syncope , fever Was found to have pyelonephritis / sepsis   Was incidentally found to have a RUL pulmonary nodule ( 11 mm x 7 mm)  No pulmonary symptoms     Aug. 26, 2024 Jane Montes is seen for follow up of her HLD, her CAC score is 0  Hx of obesity Wt is 165  Leaving for [place] / [place]  tomorrow  Glucoses have been great Now on Mounjaro    3 weeks ago  Started having sudden pain under her left breast  No dyspnea. Pain is slightly worse with deep breath No related to eating, not related to exercise   Has occurred 7-8 times since then  Dealing with stress  Rides her peleton twice a week  Has not pulled a muscle    January 15, 2024: Jane Montes is seen today for follow-up visit.  She has had some unusual chest pain on the left side of her chest.   Has had some dizzy spells off and on for the past several months  Might last several minutes - several hours   She was seen in the ER yesterday with episodes of lightheadedness and some mild nausea.  She noted that her heart rate has been quite variable.  She noted some dizziness and her Apple Watch showed a heart rate in the 50s.  Has had nausea and vomiting  Went down to the ER  Troponins were normal  Potassium was 3.2   Mother had MI at age 49 Brother died at age 53 of MI   She had recent labs performed at her primary medical doctor's office off of her lipid-lowering medications.  Her total cholesterol was 129 HDL is 47 LDL is 55 Triglyceride levels 134    Wt is 152 ( down 13 lbs,  down a total of 80 lbs from her high )   February 05, 2024 Jane Montes is seen for follow up of of her HLD,  She was seen several weeks ago with significant nausea, vomitting and abd pain   Wt is  We reduced the dose of her mounjaro   at her last office visit   Recent lipids showed: Chol = 188 LDL is 87 Trigs = 283 HDL = 44    Current Outpatient  Medications on File Prior to Visit  Medication Sig Dispense Refill   DULoxetine  (CYMBALTA ) 60 MG capsule Take 1 capsule (60 mg total) by mouth daily. 30 capsule 1   indapamide  (LOZOL ) 1.25 MG tablet Take 1 tablet (1.25 mg total) by mouth every morning. 90 tablet 3   indapamide  (LOZOL ) 1.25 MG tablet Take 1 tablet (1.25 mg total) by mouth every morning. 90 tablet 3   tirzepatide  (MOUNJARO ) 10 MG/0.5ML Pen Inject 10 mg into the skin once a week. 2 mL 11   traZODone  (DESYREL ) 50 MG tablet Take 0.5-1 tablets (25-50 mg total) by mouth at bedtime as needed for sleep. 30 tablet 1   DULoxetine  (CYMBALTA ) 30 MG capsule Take 1 capsule (30 mg total) by mouth daily for 3 days, THEN 2 capsules (60 mg total) daily. 60 capsule 0   Multiple Vitamin (MULTIVITAMIN ADULT PO)      No current facility-administered medications on file prior to visit.    Allergies  Allergen Reactions   Cephalexin Hives, Nausea Only and Rash    Past Medical History:  Diagnosis Date   Adenoma of right adrenal gland 10/12/2018   Noted on MRI Abd, measuring 2.2 x 2.4 x 2.7 cm   Anxiety    Aortic atherosclerosis (HCC) 10/10/2018   Noted on US    Diabetes mellitus without complication (HCC)    Fatty liver 10/12/2018   Noted on MRI Abd   Fatty liver 09/29/2020   GERD (gastroesophageal reflux disease) 09/16/2015   Mod. noted on KUB   Heart palpitations    High cholesterol    History of chest pain    History of dizziness    History of hiatal hernia 09/16/2015   Small, noted on KUB   History of kidney stones    History of migraine    Hydronephrosis of right kidney 10/12/2018   Mild, noted on MRI Abd   Hyperlipidemia    Left anterior fascicular block (LAFB) 01/10/2013   noted on EKG   Left kidney mass 10/12/2018   Hyperechoic mass mid left kidney measuring 1.0 x 1.0 x 1.2 cm, noted on US  AB   Nephrolithiasis 10/12/2018   Right, Noted on MRI Abd   Pelvic pain    Chronic, right lower quadrant   Pyelonephritis 11/23/2022    Syncope 11/24/2022   Wears contact lenses    Wears glasses     Past Surgical History:  Procedure Laterality Date   ABDOMINAL HYSTERECTOMY     partial   CHOLECYSTECTOMY N/A 11/18/2020   Procedure: LAPAROSCOPIC CHOLECYSTECTOMY;  Surgeon: Oza Blumenthal, MD;  Location: [facility];  Service: General;  Laterality: N/A;   ENDOMETRIAL ABLATION     Pelvic   HOLMIUM LASER APPLICATION Right 11/15/2018   Procedure: HOLMIUM LASER APPLICATION;  Surgeon: Christina Coyer, MD;  Location: [facility]  Service: Urology;  Laterality: Right;   URETEROSCOPY WITH HOLMIUM LASER LITHOTRIPSY Right 11/15/2018   Procedure: CYSTOSCOPY, RETROGRADE, URETEROSCOPY WITH HOLMIUM LASER LITHOTRIPSY/STENT PLACEMENT;  Surgeon: Christina Coyer, MD;  Location: [facility];  Service: Urology;  Laterality: Right;   VAGINAL HYSTERECTOMY  2007   Partial   WISDOM TOOTH EXTRACTION      Social History   Tobacco Use  Smoking Status Never  Smokeless Tobacco Never    Social History   Substance and Sexual Activity  Alcohol Use Yes   Comment: occ    Family History  Problem Relation Age of Onset   Diabetes Mother        Christiane Cowing (recent diagnosis) causes thrombocytosis   Heart attack Mother 32       died from sepsis/osteomyelitis   Heart disease Mother    Seizures Mother    Dementia Mother    Hypertension Father    COPD Father    Bipolar disorder Father    Parkinson's disease Father    Cervical cancer Sister    Alcohol abuse Brother    COPD Brother    Hyperlipidemia Brother    Hypertension Brother    Stroke Brother    Heart attack Brother    COPD Maternal Grandmother    Hyperlipidemia Maternal Grandmother    Heart attack Maternal Grandmother    Depression Paternal Grandmother    Heart disease Paternal Grandmother    Heart attack Paternal Grandmother    Colon cancer Neg Hx    Esophageal cancer Neg Hx    Stomach cancer Neg Hx    Rectal cancer Neg Hx      Reviw of Systems:  Reviewed in the HPI.  All other systems are negative.    Physical Exam: Blood pressure 120/84, pulse 82, height 5\' 2"  (1.575 m), weight 156 lb 6.4 oz (70.9 kg), SpO2 97%.       GEN:  Well nourished, well developed in no acute distress HEENT: Normal NECK: No JVD; No carotid bruits LYMPHATICS: No lymphadenopathy CARDIAC: RRR , no murmurs, rubs, gallops RESPIRATORY:  Clear to auscultation without rales, wheezing or rhonchi  ABDOMEN: Soft, non-tender, non-distended MUSCULOSKELETAL:  No edema; No deformity  SKIN: Warm and dry NEUROLOGIC:  Alert and oriented x 3    ECG:          Assessment / Plan:   1.  Chest discomfort:   -    Davita is doing very well.  She is not having any episodes of chest pain.  2.  Palpitations and dizziness:       3.  Obesity:   She is doing very well on Mounjaro .  We had to reduce the dose several weeks ago but she seems to be doing right well with this dose.    4.    Hyperlipidemia:   Her fenofibrate  and atorvastatin  were recently stopped by the lipid clinic.  Her triglycerides are or up to 283.  I would like to for her to restart her fenofibrate .  Her LDL is 87.  I would like to see her LDL  between 50 and 70. Will start her on rosuvastatin 20 mg.  If she is unable to tolerate the rosuvastatin I would have a low threshold to refer her to the lipid clinic for consideration of a PCSK9.  She has a strong family history of premature coronary artery disease.  Her LP(a) is 228.  Aaron Aas           Ahmad Alert, MD  02/05/2024 1:44 PM    [facility] [address] [place],  [address] [place], [place]  [address] Pager [phone] Phone: [phone]; Fax: [phone]

## 2024-02-05 ENCOUNTER — Other Ambulatory Visit (HOSPITAL_COMMUNITY): Payer: Self-pay

## 2024-02-05 ENCOUNTER — Ambulatory Visit: Attending: Cardiovascular Disease | Admitting: Cardiovascular Disease

## 2024-02-05 ENCOUNTER — Encounter: Payer: Self-pay | Admitting: Cardiovascular Disease

## 2024-02-05 VITALS — BP 120/84 | HR 82 | Ht 62.0 in | Wt 156.4 lb

## 2024-02-05 DIAGNOSIS — E782 Mixed hyperlipidemia: Secondary | ICD-10-CM

## 2024-02-05 DIAGNOSIS — I10 Essential (primary) hypertension: Secondary | ICD-10-CM | POA: Diagnosis not present

## 2024-02-05 DIAGNOSIS — Z79899 Other long term (current) drug therapy: Secondary | ICD-10-CM

## 2024-02-05 MED ORDER — ROSUVASTATIN CALCIUM 20 MG PO TABS
20.0000 mg | ORAL_TABLET | Freq: Every day | ORAL | 3 refills | Status: AC
  Filled 2024-02-05: qty 90, 90d supply, fill #0
  Filled 2024-04-20: qty 90, 90d supply, fill #1
  Filled 2024-07-14: qty 90, 90d supply, fill #2
  Filled 2024-09-25 – 2024-10-07 (×2): qty 90, 90d supply, fill #3

## 2024-02-05 MED ORDER — FENOFIBRATE 145 MG PO TABS
145.0000 mg | ORAL_TABLET | Freq: Every day | ORAL | 3 refills | Status: AC
  Filled 2024-02-05: qty 90, 90d supply, fill #0
  Filled 2024-04-20: qty 90, 90d supply, fill #1
  Filled 2024-07-14: qty 90, 90d supply, fill #2
  Filled 2024-10-28: qty 90, 90d supply, fill #3

## 2024-02-05 NOTE — Patient Instructions (Signed)
 Medication Instructions:  START Rosuvastatin/Crestor 20mg  daily CONTINUE Fenofibrate  145mg  daily *If you need a refill on your cardiac medications before your next appointment, please call your pharmacy*  Lab Work: Lipids, ALT, BMET in 3 months If you have labs (blood work) drawn today and your tests are completely normal, you will receive your results only by: MyChart Message (if you have MyChart) OR A paper copy in the mail If you have any lab test that is abnormal or we need to change your treatment, we will call you to review the results.  Follow-Up: At [facility], you and your health needs are our priority.  As part of our continuing mission to provide you with exceptional heart care, our providers are all part of one team.  This team includes your primary Cardiologist (physician) and Advanced Practice Providers or APPs (Physician Assistants and Nurse Practitioners) who all work together to provide you with the care you need, when you need it.  Your next appointment:   3 month(s)  Provider:   Sheryle Donning, MD

## 2024-02-19 LAB — OPHTHALMOLOGY REPORT-SCANNED

## 2024-02-21 ENCOUNTER — Other Ambulatory Visit (HOSPITAL_COMMUNITY): Payer: Self-pay

## 2024-02-22 DIAGNOSIS — N200 Calculus of kidney: Secondary | ICD-10-CM | POA: Diagnosis not present

## 2024-03-04 ENCOUNTER — Other Ambulatory Visit (HOSPITAL_COMMUNITY): Payer: Self-pay

## 2024-03-04 DIAGNOSIS — F338 Other recurrent depressive disorders: Secondary | ICD-10-CM | POA: Diagnosis not present

## 2024-03-04 DIAGNOSIS — F411 Generalized anxiety disorder: Secondary | ICD-10-CM | POA: Diagnosis not present

## 2024-03-04 MED ORDER — TRAZODONE HCL 50 MG PO TABS
25.0000 mg | ORAL_TABLET | Freq: Every evening | ORAL | 0 refills | Status: DC | PRN
  Filled 2024-03-04 – 2024-04-07 (×2): qty 90, 90d supply, fill #0

## 2024-03-04 MED ORDER — DULOXETINE HCL 60 MG PO CPEP
60.0000 mg | ORAL_CAPSULE | Freq: Every day | ORAL | 0 refills | Status: DC
  Filled 2024-03-04 – 2024-03-24 (×2): qty 90, 90d supply, fill #0

## 2024-03-05 ENCOUNTER — Other Ambulatory Visit (HOSPITAL_COMMUNITY): Payer: Self-pay

## 2024-03-06 DIAGNOSIS — F4323 Adjustment disorder with mixed anxiety and depressed mood: Secondary | ICD-10-CM | POA: Diagnosis not present

## 2024-03-21 DIAGNOSIS — F4323 Adjustment disorder with mixed anxiety and depressed mood: Secondary | ICD-10-CM | POA: Diagnosis not present

## 2024-03-24 ENCOUNTER — Other Ambulatory Visit (HOSPITAL_COMMUNITY): Payer: Self-pay

## 2024-03-27 DIAGNOSIS — F4323 Adjustment disorder with mixed anxiety and depressed mood: Secondary | ICD-10-CM | POA: Diagnosis not present

## 2024-03-28 ENCOUNTER — Encounter: Payer: Self-pay | Admitting: Family Medicine

## 2024-03-28 ENCOUNTER — Ambulatory Visit: Admitting: Family Medicine

## 2024-03-28 ENCOUNTER — Other Ambulatory Visit (HOSPITAL_BASED_OUTPATIENT_CLINIC_OR_DEPARTMENT_OTHER): Payer: Self-pay

## 2024-03-28 ENCOUNTER — Ambulatory Visit: Payer: Self-pay

## 2024-03-28 ENCOUNTER — Encounter: Payer: Self-pay | Admitting: Family

## 2024-03-28 VITALS — BP 128/76 | HR 88 | Temp 97.7°F | Resp 16 | Ht 62.0 in | Wt 153.0 lb

## 2024-03-28 DIAGNOSIS — R490 Dysphonia: Secondary | ICD-10-CM | POA: Diagnosis not present

## 2024-03-28 MED ORDER — PREDNISONE 10 MG PO TABS
ORAL_TABLET | ORAL | 0 refills | Status: AC
  Filled 2024-03-28: qty 27, 7d supply, fill #0

## 2024-03-28 NOTE — Telephone Encounter (Addendum)
  FYI Only or Action Required?: FYI only for provider.  Patient was last seen in primary care on 12/28/2023 by Dorrene Gaucher, NP. Called Nurse Triage reporting Dysphagia. Symptoms began over the past few weeks. Interventions attempted: Rest, hydration, or home remedies. Symptoms are: gradually worsening.  Triage Disposition: See Physician Within 24 Hours  Patient/caregiver understands and will follow disposition?: Yes   Appointment made for today 03/28/2024 at 3 PM with Dr Dawna Etienne.               Copied from CRM [id number]. Topic: Clinical - Red Word Triage >> Mar 28, 2024  1:30 PM [place] A wrote: [facility] that prompted transfer to Nurse Triage: Vocal Cord Paralysis having trouble swallowing Reason for Disposition  [1] Symptoms of pill stuck in throat or esophagus (e.g., pain in throat or chest, FB sensation) AND [2] no relief after using Care Advice  Answer Assessment - Initial Assessment Questions 1. DESCRIPTION: Tell me more about this problem. Are you  having trouble swallowing liquids, solids, or both? Any trouble with swallowing saliva (spit)?     I feel it with solids--liquids are fine but I can feel it in that one spot 2. SEVERITY: How bad is the swallowing difficulty?  (e.g., Scale 1-10; or mild, moderate, severe)   - MILD (0-3): Occasional swallowing difficulty; has trouble swallowing certain types of foods or liquids.   - MODERATE (4-7): Frequent swallowing difficulty; only able to swallow small amounts of foods and fluids.   - SEVERE (8-10): Unable to swallow any foods, fluids, or saliva; sensation of lump in throat or something stuck in throat, and frequent drooling or spitting may be present.     Feels like something is stuck 3. ONSET: When did the swallowing problems begin?      Over the past few weeks   over the last two days voice getting raspy 4. CAUSE: What do you think is causing the problem?  (e.g., dry mouth, food or pill  stuck in throat, mouth pain, sore throat, progression of disease process such as dementia or Parkinson's disease).      Possibly vocal cord paralysis -- similar issue in the past 5. CHRONIC or RECURRENT: Is this a new problem for you?  If No, ask: How long have you had this problem? (e.g., days, weeks, months)      Over the past few weeks   over the last two days voice getting raspy 6. OTHER SYMPTOMS: Do you have any other symptoms? (e.g., chest pain, difficulty breathing, mouth sores, sore throat, swollen tongue, chest pain)     Raspy voice 7. PREGNANCY: Is there any chance you are pregnant? When was your last menstrual period?     No   Patient states that about 4 years ago she had a similar issue that ended up being vocal cord paralysis at that time.  She states that she contacted the ENT but they have closed so she then called her PCP office. Over the past few weeks she has started to feel like something is stuck in her throat and now her voice is getting raspy so she wants to get checked out. She is advised that if anything gets worse to go to the Emergency Room. She verbalized understanding.  Protocols used: Swallowing Difficulty-A-AH

## 2024-03-28 NOTE — Patient Instructions (Signed)
 If you do not hear anything about your referral in the next 1-2 weeks, call our office and ask for an update.  Take your omeprazole twice daily for the next 14 days.  Let us  know if you need anything.

## 2024-03-28 NOTE — Progress Notes (Signed)
 Chief Complaint  Patient presents with   Choking    Problem Swallowing     Subjective: Patient is a 53 y.o. female here for difficulty swallowing.  Over the past 2 weeks the patient has had difficulty swallowing.  She feels it in her neck.  This happened around 4 years ago.  She went to ENT and was placed on a prednisone taper and a PPI.  She was diagnosed with partial vocal cord paralysis.  She currently takes omeprazole 20 mg daily.  Reflux symptoms are controlled.  Voice is changing also.  She denies any pain, recent illness, runny/stuffy nose, or swallowing anything caustic.  Past Medical History:  Diagnosis Date   Adenoma of right adrenal gland 10/12/2018   Noted on MRI Abd, measuring 2.2 x 2.4 x 2.7 cm   Anxiety    Aortic atherosclerosis (HCC) 10/10/2018   Noted on US    Diabetes mellitus without complication (HCC)    Fatty liver 10/12/2018   Noted on MRI Abd   Fatty liver 09/29/2020   GERD (gastroesophageal reflux disease) 09/16/2015   Mod. noted on KUB   Heart palpitations    High cholesterol    History of chest pain    History of dizziness    History of hiatal hernia 09/16/2015   Small, noted on KUB   History of kidney stones    History of migraine    Hydronephrosis of right kidney 10/12/2018   Mild, noted on MRI Abd   Hyperlipidemia    Left anterior fascicular block (LAFB) 01/10/2013   noted on EKG   Left kidney mass 10/12/2018   Hyperechoic mass mid left kidney measuring 1.0 x 1.0 x 1.2 cm, noted on US  AB   Nephrolithiasis 10/12/2018   Right, Noted on MRI Abd   Pelvic pain    Chronic, right lower quadrant   Pyelonephritis 11/23/2022   Syncope 11/24/2022   Wears contact lenses    Wears glasses     Objective: BP 128/76 (BP Location: Left Arm, Patient Position: Sitting)   Pulse 88   Temp 97.7 F (36.5 C) (Oral)   Resp 16   Ht 5' 2 (1.575 m)   Wt 153 lb (69.4 kg)   SpO2 98%   BMI 27.98 kg/m  General: Awake, appears stated age Mouth: MMM, no  pharyngeal exudate or erythema, no postnasal drainage Nose: Nares are patent without rhinorrhea Neck: No asymmetry, TTP, or obvious mass Heart: RRR Lungs: CTAB, no rales, wheezes or rhonchi. No accessory muscle use Psych: Age appropriate judgment and insight, normal affect and mood  Assessment and Plan: Hoarseness of voice - Plan: Ambulatory referral to Speech Therapy  Same prednisone taper as before, 60 mg daily for 2 days and then taper down by 10 mg daily until running out.  She will take omeprazole 20 mg twice daily for the next 2 weeks.  Refer to SLP for their evaluation/opinion which she will keep if things do not improve.  If still no improvement, would consider referral to a new ENT as her previous one has since retired. The patient voiced understanding and agreement to the plan.  Shellie Dials [place], DO 03/28/24  3:29 PM

## 2024-03-31 ENCOUNTER — Encounter: Payer: Self-pay | Admitting: *Deleted

## 2024-04-01 ENCOUNTER — Ambulatory Visit (INDEPENDENT_AMBULATORY_CARE_PROVIDER_SITE_OTHER): Admitting: Family

## 2024-04-01 VITALS — BP 117/76 | HR 84 | Temp 97.8°F | Resp 16 | Ht 62.0 in | Wt 154.0 lb

## 2024-04-01 DIAGNOSIS — Z7985 Long-term (current) use of injectable non-insulin antidiabetic drugs: Secondary | ICD-10-CM | POA: Diagnosis not present

## 2024-04-01 DIAGNOSIS — K219 Gastro-esophageal reflux disease without esophagitis: Secondary | ICD-10-CM

## 2024-04-01 DIAGNOSIS — N200 Calculus of kidney: Secondary | ICD-10-CM | POA: Diagnosis not present

## 2024-04-01 DIAGNOSIS — E782 Mixed hyperlipidemia: Secondary | ICD-10-CM | POA: Diagnosis not present

## 2024-04-01 DIAGNOSIS — F419 Anxiety disorder, unspecified: Secondary | ICD-10-CM | POA: Diagnosis not present

## 2024-04-01 DIAGNOSIS — I10 Essential (primary) hypertension: Secondary | ICD-10-CM

## 2024-04-01 DIAGNOSIS — E119 Type 2 diabetes mellitus without complications: Secondary | ICD-10-CM | POA: Diagnosis not present

## 2024-04-01 NOTE — Assessment & Plan Note (Signed)
Stable on cymbalta.  

## 2024-04-01 NOTE — Assessment & Plan Note (Signed)
  Presence of three asymptomatic kidney stones. - Encourage adequate hydration.

## 2024-04-01 NOTE — Assessment & Plan Note (Signed)
 Lab Results  Component Value Date   CHOL 188 01/29/2024   HDL 44.70 01/29/2024   LDLCALC 87 01/29/2024   LDLDIRECT 87.0 03/30/2021   TRIG 283.0 (H) 01/29/2024   CHOLHDL 4 01/29/2024   Stable on crestor  and tricor .

## 2024-04-01 NOTE — Assessment & Plan Note (Addendum)
 Lab Results  Component Value Date   HGBA1C 5.7 12/28/2023   HGBA1C 5.4 11/24/2022   HGBA1C 6.0 12/30/2021   Lab Results  Component Value Date   MICROALBUR <0.7 12/28/2023   LDLCALC 87 01/29/2024   CREATININE 1.12 01/29/2024    Episode of hypoglycemia with blood sugar of 48, likely related to Mounjaro . Cardiology is managing and is in the process of bringing her dose down.   - Decrease Mounjaro  dosage.

## 2024-04-01 NOTE — Assessment & Plan Note (Signed)
 BP Readings from Last 3 Encounters:  04/01/24 117/76  03/28/24 128/76  02/05/24 120/84   Stable without meds.

## 2024-04-01 NOTE — Progress Notes (Signed)
 Subjective:     Patient ID: Jane Montes, female    DOB: 02/16/1971, 53 y.o.   MRN: [id number]  Chief Complaint  Patient presents with   Diabetes    Here for follow up    Diabetes    Discussed the use of AI scribe software for clinical note transcription with the patient, who gave verbal consent to proceed.  History of Present Illness  Jane Montes is a 53 year old female with a history of paralyzed vocal cord who presents with difficulty swallowing and a raspy voice.  Dysphagia and oropharyngeal symptoms - Difficulty swallowing pills for the past few weeks - Sensation of something being stuck in the throat - Raspy voice present for several weeks - No laryngitis - History of paralyzed vocal cord - Advised to minimize talking  Gastrointestinal symptoms - Episodes of postprandial fullness and occasional violent vomiting within ten minutes of eating, occurring intermittently over the past couple of years - Episodes are not daily and do not occur with every food - Fullness and need to cough, almost like choking, during episodes  Medication use and side effects - Currently taking Pepcid  twice daily - Started a tapering dose of prednisone  four days ago - Monitors blood sugar due to prednisone  use - Noted a low blood sugar reading of 48 - Weight currently increased due to prednisone  use  Weight changes and antidiabetic therapy - Significant weight loss with Mounjaro  - Mounjaro  dose decreased due to nausea and stable weight  Gastroesophageal reflux symptoms - Reflux present, previously worse when weight was higher -on pepcid  bid.    Health Maintenance Due  Topic Date Due   Hepatitis B Vaccines (1 of 3 - 19+ 3-dose series) Never done   COVID-19 Vaccine (3 - Pfizer risk series) 12/11/2019   Pneumococcal Vaccine 71-65 Years old (2 of 2 - PPSV23, PCV20, or PCV21) 03/14/2022   Cervical Cancer Screening (HPV/Pap Cotest)  07/24/2022   OPHTHALMOLOGY EXAM  02/14/2024     Past Medical History:  Diagnosis Date   Adenoma of right adrenal gland 10/12/2018   Noted on MRI Abd, measuring 2.2 x 2.4 x 2.7 cm   Anxiety    Aortic atherosclerosis (HCC) 10/10/2018   Noted on US    Diabetes mellitus without complication (HCC)    Fatty liver 10/12/2018   Noted on MRI Abd   Fatty liver 09/29/2020   GERD (gastroesophageal reflux disease) 09/16/2015   Mod. noted on KUB   Heart palpitations    High cholesterol    History of chest pain    History of dizziness    History of hiatal hernia 09/16/2015   Small, noted on KUB   History of kidney stones    History of migraine    Hydronephrosis of right kidney 10/12/2018   Mild, noted on MRI Abd   Hyperlipidemia    Left anterior fascicular block (LAFB) 01/10/2013   noted on EKG   Left kidney mass 10/12/2018   Hyperechoic mass mid left kidney measuring 1.0 x 1.0 x 1.2 cm, noted on US  AB   Nephrolithiasis 10/12/2018   Right, Noted on MRI Abd   Pelvic pain    Chronic, right lower quadrant   Pyelonephritis 11/23/2022   Syncope 11/24/2022   Wears contact lenses    Wears glasses     Past Surgical History:  Procedure Laterality Date   ABDOMINAL HYSTERECTOMY     partial   CHOLECYSTECTOMY N/A 11/18/2020   Procedure: LAPAROSCOPIC CHOLECYSTECTOMY;  Surgeon: Vernetta,  Vicenta, MD;  Location: [facility];  Service: General;  Laterality: N/A;   ENDOMETRIAL ABLATION     Pelvic   HOLMIUM LASER APPLICATION Right 11/15/2018   Procedure: HOLMIUM LASER APPLICATION;  Surgeon: Nieves Cough, MD;  Location: [facility];  Service: Urology;  Laterality: Right;   URETEROSCOPY WITH HOLMIUM LASER LITHOTRIPSY Right 11/15/2018   Procedure: CYSTOSCOPY, RETROGRADE, URETEROSCOPY WITH HOLMIUM LASER LITHOTRIPSY/STENT PLACEMENT;  Surgeon: Nieves Cough, MD;  Location: [facility];  Service: Urology;  Laterality: Right;   VAGINAL HYSTERECTOMY  2007   Partial   WISDOM TOOTH EXTRACTION       Family History  Problem Relation Age of Onset   Diabetes Mother        [name] (recent diagnosis) causes thrombocytosis   Heart attack Mother 79       died from sepsis/osteomyelitis   Heart disease Mother    Seizures Mother    Dementia Mother    Hypertension Father    COPD Father    Bipolar disorder Father    Parkinson's disease Father    Cervical cancer Sister    Alcohol abuse Brother    COPD Brother    Hyperlipidemia Brother    Hypertension Brother    Stroke Brother    Heart attack Brother    COPD Maternal Grandmother    Hyperlipidemia Maternal Grandmother    Heart attack Maternal Grandmother    Depression Paternal Grandmother    Heart disease Paternal Grandmother    Heart attack Paternal Grandmother    Colon cancer Neg Hx    Esophageal cancer Neg Hx    Stomach cancer Neg Hx    Rectal cancer Neg Hx     Social History   Socioeconomic History   Marital status: Divorced    Spouse name: Not on file   Number of children: Not on file   Years of education: Not on file   Highest education level: Not on file  Occupational History   Occupation: Nurse    Employer: [employer]  Tobacco Use   Smoking status: Never   Smokeless tobacco: Never  Vaping Use   Vaping status: Never Used  Substance and Sexual Activity   Alcohol use: Yes    Comment: occ   Drug use: No   Sexual activity: Not Currently    Birth control/protection: Surgical  Other Topics Concern   Not on file  Social History Narrative   Works for Heart failure team at [employer]   2 daughters   Divorced   Enjoys reading/working out, spending time outdoors.     Social Drivers of Corporate investment banker Strain: Low Risk  (04/02/2018)   Overall Financial Resource Strain (CARDIA)    Difficulty of Paying Living Expenses: Not hard at all  Food Insecurity: No Food Insecurity (11/23/2022)   Hunger Vital Sign    Worried About Running Out of Food in the Last Year: Never true    Ran Out of Food in the  Last Year: Never true  Transportation Needs: No Transportation Needs (11/23/2022)   PRAPARE - Administrator, Civil Service (Medical): No    Lack of Transportation (Non-Medical): No  Physical Activity: Sufficiently Active (04/02/2018)   Exercise Vital Sign    Days of Exercise per Week: 3 days    Minutes of Exercise per Session: 60 min  Stress: Stress Concern Present (04/02/2018)   [employer] of Occupational Health - Occupational Stress Questionnaire    Feeling of  Stress : Rather much  Social Connections: Moderately Integrated (04/02/2018)   Social Connection and Isolation Panel    Frequency of Communication with Friends and Family: More than three times a week    Frequency of Social Gatherings with Friends and Family: Three times a week    Attends Religious Services: More than 4 times per year    Active Member of Clubs or Organizations: Yes    Attends Banker Meetings: More than 4 times per year    Marital Status: Separated  Intimate Partner Violence: Not At Risk (11/23/2022)   Humiliation, Afraid, Rape, and Kick questionnaire    Fear of Current or Ex-Partner: No    Emotionally Abused: No    Physically Abused: No    Sexually Abused: No    Outpatient Medications Prior to Visit  Medication Sig Dispense Refill   DULoxetine  (CYMBALTA ) 60 MG capsule Take 1 capsule (60 mg total) by mouth daily. 90 capsule 0   fenofibrate  (TRICOR ) 145 MG tablet Take 1 tablet (145 mg total) by mouth daily. 90 tablet 3   indapamide  (LOZOL ) 1.25 MG tablet Take 1 tablet (1.25 mg total) by mouth every morning. 90 tablet 3   Multiple Vitamin (MULTIVITAMIN ADULT PO)      predniSONE  (DELTASONE ) 10 MG tablet Take 6 tablets (60 mg total) by mouth daily for 2 days, THEN 5 tablets (50 mg total) daily for 1 day, THEN 4 tablets (40 mg total) daily for 1 day, THEN 3 tablets (30 mg total) daily for 1 day, THEN 2 tablets (20 mg total) daily for 1 day, THEN 1 tablet (10 mg total) daily for 1 day.  27 tablet 0   rosuvastatin  (CRESTOR ) 20 MG tablet Take 1 tablet (20 mg total) by mouth daily. 90 tablet 3   tirzepatide  (MOUNJARO ) 10 MG/0.5ML Pen Inject 10 mg into the skin once a week. 2 mL 11   traZODone  (DESYREL ) 50 MG tablet Take 0.5-1 tablets (25-50 mg total) by mouth at bedtime as needed for sleep. 30 tablet 1   indapamide  (LOZOL ) 1.25 MG tablet Take 1 tablet (1.25 mg total) by mouth every morning. 90 tablet 3   traZODone  (DESYREL ) 50 MG tablet Take 1/2-1 tablet (25-50 mg total) by mouth at bedtime as needed for sleep. 90 tablet 0   No facility-administered medications prior to visit.    Allergies  Allergen Reactions   Cephalexin Hives, Nausea Only and Rash    ROS See HPI    Objective:    Physical Exam Constitutional:      General: She is not in acute distress.    Appearance: Normal appearance. She is well-developed.  HENT:     Head: Normocephalic and atraumatic.     Right Ear: External ear normal.     Left Ear: External ear normal.   Eyes:     General: No scleral icterus.  Neck:     Thyroid : No thyromegaly.   Cardiovascular:     Rate and Rhythm: Normal rate and regular rhythm.     Heart sounds: Normal heart sounds. No murmur heard. Pulmonary:     Effort: Pulmonary effort is normal. No respiratory distress.     Breath sounds: Normal breath sounds. No wheezing.   Musculoskeletal:        General: No swelling.     Cervical back: Neck supple.   Skin:    General: Skin is warm and dry.   Neurological:     Mental Status: She is alert and oriented to  person, place, and time.   Psychiatric:        Mood and Affect: Mood normal.        Behavior: Behavior normal.        Thought Content: Thought content normal.        Judgment: Judgment normal.      BP 117/76 (BP Location: Right Arm, Patient Position: Sitting, Cuff Size: Normal)   Pulse 84   Temp 97.8 F (36.6 C) (Oral)   Resp 16   Ht 5' 2 (1.575 m)   Wt 154 lb (69.9 kg)   SpO2 99%   BMI 28.17 kg/m   Wt Readings from Last 3 Encounters:  04/01/24 154 lb (69.9 kg)  03/28/24 153 lb (69.4 kg)  02/05/24 156 lb 6.4 oz (70.9 kg)   Lab Results  Component Value Date   HGBA1C 5.7 12/28/2023        Assessment & Plan:   Problem List Items Addressed This Visit       Unprioritized   Nephrolithiasis    Presence of three asymptomatic kidney stones. - Encourage adequate hydration.      Hypertension   BP Readings from Last 3 Encounters:  04/01/24 117/76  03/28/24 128/76  02/05/24 120/84   Stable without meds.       Hyperlipidemia   Lab Results  Component Value Date   CHOL 188 01/29/2024   HDL 44.70 01/29/2024   LDLCALC 87 01/29/2024   LDLDIRECT 87.0 03/30/2021   TRIG 283.0 (H) 01/29/2024   CHOLHDL 4 01/29/2024   Stable on crestor  and tricor .      Controlled type 2 diabetes mellitus without complication, without long-term current use of insulin  (HCC) - Primary   Lab Results  Component Value Date   HGBA1C 5.7 12/28/2023   HGBA1C 5.4 11/24/2022   HGBA1C 6.0 12/30/2021   Lab Results  Component Value Date   MICROALBUR <0.7 12/28/2023   LDLCALC 87 01/29/2024   CREATININE 1.12 01/29/2024    Episode of hypoglycemia with blood sugar of 48, likely related to Mounjaro . Cardiology is managing and is in the process of bringing her dose down.   - Decrease Mounjaro  dosage.      Anxiety   Stable on cymbalta .       Other Visit Diagnoses       Gastroesophageal reflux disease, unspecified whether esophagitis present       Relevant Orders   Ambulatory referral to Gastroenterology       I am having Jane Montes maintain her indapamide , Multiple Vitamin (MULTIVITAMIN ADULT PO), Mounjaro , indapamide , traZODone , rosuvastatin , fenofibrate , DULoxetine , traZODone , and predniSONE .  No orders of the defined types were placed in this encounter.

## 2024-04-07 ENCOUNTER — Other Ambulatory Visit (HOSPITAL_COMMUNITY): Payer: Self-pay

## 2024-04-07 ENCOUNTER — Other Ambulatory Visit: Payer: Self-pay

## 2024-04-16 DIAGNOSIS — F4323 Adjustment disorder with mixed anxiety and depressed mood: Secondary | ICD-10-CM | POA: Diagnosis not present

## 2024-04-20 ENCOUNTER — Other Ambulatory Visit (HOSPITAL_COMMUNITY): Payer: Self-pay

## 2024-04-21 DIAGNOSIS — I10 Essential (primary) hypertension: Secondary | ICD-10-CM | POA: Diagnosis not present

## 2024-04-21 DIAGNOSIS — Z79899 Other long term (current) drug therapy: Secondary | ICD-10-CM | POA: Diagnosis not present

## 2024-04-21 DIAGNOSIS — E782 Mixed hyperlipidemia: Secondary | ICD-10-CM | POA: Diagnosis not present

## 2024-04-22 LAB — BASIC METABOLIC PANEL WITH GFR
BUN/Creatinine Ratio: 26 — ABNORMAL HIGH (ref 9–23)
BUN: 32 mg/dL — ABNORMAL HIGH (ref 6–24)
CO2: 21 mmol/L (ref 20–29)
Calcium: 9.7 mg/dL (ref 8.7–10.2)
Chloride: 102 mmol/L (ref 96–106)
Creatinine, Ser: 1.21 mg/dL — ABNORMAL HIGH (ref 0.57–1.00)
Glucose: 89 mg/dL (ref 70–99)
Potassium: 4.2 mmol/L (ref 3.5–5.2)
Sodium: 138 mmol/L (ref 134–144)
eGFR: 54 mL/min/1.73 — ABNORMAL LOW (ref >59)

## 2024-04-22 LAB — LIPID PANEL
Chol/HDL Ratio: 2.7 ratio (ref 0.0–4.4)
Cholesterol, Total: 140 mg/dL (ref 100–199)
HDL: 52 mg/dL (ref >39)
LDL Chol Calc (NIH): 66 mg/dL (ref 0–99)
Triglycerides: 124 mg/dL (ref 0–149)
VLDL Cholesterol Cal: 22 mg/dL (ref 5–40)

## 2024-04-22 LAB — ALT: ALT: 16 IU/L (ref 0–32)

## 2024-04-23 ENCOUNTER — Ambulatory Visit (HOSPITAL_BASED_OUTPATIENT_CLINIC_OR_DEPARTMENT_OTHER): Admitting: Cardiology

## 2024-04-23 ENCOUNTER — Encounter (HOSPITAL_BASED_OUTPATIENT_CLINIC_OR_DEPARTMENT_OTHER): Payer: Self-pay | Admitting: Cardiology

## 2024-04-23 VITALS — BP 110/80 | HR 76 | Ht 62.0 in | Wt 158.0 lb

## 2024-04-23 DIAGNOSIS — E119 Type 2 diabetes mellitus without complications: Secondary | ICD-10-CM

## 2024-04-23 DIAGNOSIS — E782 Mixed hyperlipidemia: Secondary | ICD-10-CM

## 2024-04-23 DIAGNOSIS — E7841 Elevated Lipoprotein(a): Secondary | ICD-10-CM | POA: Diagnosis not present

## 2024-04-23 DIAGNOSIS — Z8249 Family history of ischemic heart disease and other diseases of the circulatory system: Secondary | ICD-10-CM

## 2024-04-23 DIAGNOSIS — R072 Precordial pain: Secondary | ICD-10-CM

## 2024-04-23 DIAGNOSIS — Z7189 Other specified counseling: Secondary | ICD-10-CM

## 2024-04-23 NOTE — Progress Notes (Signed)
 Cardiology Office Note:  .   Date:  04/23/2024  ID:  Jane Montes, DOB April 10, 1971, MRN [id number] PCP: Daryl Setter, NP  [facility] Providers Cardiologist:  Shelda Bruckner, MD {  History of Present Illness: Jane   Drina SITLALY Montes is a 53 y.o. female with PMH family history of heart disease, palpitations, mixed hyperlipidemia, elevated lp(a). She was previously followed by Dr. Alveta and established care with me on 04/23/24.   Pertinent CV history: -coronary CT 2021: Ca score 0, no significant coronary disease -echo stress 2014 unremarkable FH: Younger brother died age 31 of MI, mother had MI age 86.  Today: Reviewed spell from late March/early April when she was dizzy, HR dropped, felt nauseated/jaw pain. Went to ER, hsTn normal, BP was elevated, K was low. Had never had episode like this before.  Continues to have twinges in her chest, tightness that moves to her jaw like a toothache, sometimes in her arm. Can lasts seconds up to 5-10 minutes. No clear aggravating/alleviating factors.   She has lost 80 lbs from her peak. Weaning off Mounjaro . Didn't have significant GI symptoms on the mounjaro , did on semaglutide . Has had fewer vomiting episodes since weaning. Has remote history of hiatal hernia on imaging.  Has history of acid reflux, to the point of some vocal cord paralysis, a few years ago. Had an episode of feeling like a lump in her throat, restarted pepcid /steroids and after a few weeks, back to normal.  Has a Peloton bike, rides 45 min to an hour, no pain. Has some shortness of breath with this but does not cause her to stop. Planning to get back to the gym to improve her muscle mass.  ROS: Denies shortness of breath at rest or with normal exertion. No PND, orthopnea, LE edema or unexpected weight gain. No syncope or palpitations. ROS otherwise negative except as noted.   Studies Reviewed: Jane    EKG:  EKG Interpretation Date/Time:  Wednesday April 23 2024  12:17:45 EDT Ventricular Rate:  76 PR Interval:  164 QRS Duration:  92 QT Interval:  400 QTC Calculation: 450 R Axis:   -35  Text Interpretation: Normal sinus rhythm Left axis deviation Confirmed by Bruckner Shelda [id number]) on 04/23/2024 9:16:03 PM    Physical Exam:   VS:  BP 110/80 (BP Location: Left Arm, Patient Position: Sitting, Cuff Size: Normal)   Pulse 76   Ht 5' 2 (1.575 m)   Wt 158 lb (71.7 kg)   BMI 28.90 kg/m    Wt Readings from Last 3 Encounters:  04/23/24 158 lb (71.7 kg)  04/01/24 154 lb (69.9 kg)  03/28/24 153 lb (69.4 kg)    GEN: Well nourished, well developed in no acute distress HEENT: Normal, moist mucous membranes NECK: No JVD CARDIAC: regular rhythm, normal S1 and S2, no rubs or gallops. No murmur. VASCULAR: Radial and DP pulses 2+ bilaterally. No carotid bruits RESPIRATORY:  Clear to auscultation without rales, wheezing or rhonchi  ABDOMEN: Soft, non-tender, non-distended MUSCULOSKELETAL:  Ambulates independently SKIN: Warm and dry, no edema NEUROLOGIC:  Alert and oriented x 3. No focal neuro deficits noted. PSYCHIATRIC:  Normal affect    ASSESSMENT AND PLAN: .    Chest discomfort -concerning episode earlier this year, unclear etiology -does have history of GI symptoms, discussed how this can present -reviewed prior cardiac workup, which has been reassuring. Discussed further options for testing today. Discussed repeating coronary CT to exclude progression of disease, echo treadmill to evaluated for  functional changes/limitations, and cardiac PET to evaluate for microvascular disease. Suspicion for all of these currently is low but not zero. After shared decision making, will hold on testing for now and monitor for change in symptoms -reviewed red flag warning signs that need immediate medical attention  Strong family history of premature CAD Mixed hyperlipidemia Elevated lp(a) -coronary CT in 2019 very reassuring -tolerating rosuvastatin  and  fenofibrate  -lp(a) is 228  Type II diabetes -on mounjaro , doing well with this  CV risk counseling and prevention -recommend heart healthy/Mediterranean diet, with whole grains, fruits, vegetable, fish, lean meats, nuts, and olive oil. Limit salt. -recommend moderate walking, 3-5 times/week for 30-50 minutes each session. Aim for at least 150 minutes.week. Goal should be pace of 3 miles/hours, or walking 1.5 miles in 30 minutes -recommend avoidance of tobacco products. Avoid excess alcohol. -ASCVD risk score: The 10-year ASCVD risk score (Arnett DK, et al., 2019) is: 2%   Values used to calculate the score:     Age: 29 years     Clincally relevant sex: Female     Is Non-Hispanic African American: No     Diabetic: Yes     Tobacco smoker: No     Systolic Blood Pressure: 110 mmHg     Is BP treated: Yes     HDL Cholesterol: 52 mg/dL     Total Cholesterol: 140 mg/dL    Dispo: 1 year or sooner for change in symptoms  Total time of encounter: I spent 64 minutes dedicated to the care of this patient on the date of this encounter to include pre-visit review of records, face-to-face time with the patient discussing conditions above, and clinical documentation with the electronic health record. We specifically spent time today discussing chest discomfort, possible etiologies, options for workup/management as noted above.   Signed, Shelda Bruckner, MD   Shelda Bruckner, MD, PhD, [facility]  [facility]  [place]  [facility] at [facility] at [facility] [address] [place], [place] [address] [phone]

## 2024-04-23 NOTE — Patient Instructions (Signed)
 Medication Instructions:   Your physician recommends that you continue on your current medications as directed. Please refer to the Current Medication list given to you today.   *If you need a refill on your cardiac medications before your next appointment, please call your pharmacy*  Lab Work:  None ordered.  If you have labs (blood work) drawn today and your tests are completely normal, you will receive your results only by: MyChart Message (if you have MyChart) OR A paper copy in the mail If you have any lab test that is abnormal or we need to change your treatment, we will call you to review the results.  Testing/Procedures:  None ordered.  Follow-Up: At [facility], you and your health needs are our priority.  As part of our continuing mission to provide you with exceptional heart care, our providers are all part of one team.  This team includes your primary Cardiologist (physician) and Advanced Practice Providers or APPs (Physician Assistants and Nurse Practitioners) who all work together to provide you with the care you need, when you need it.  Your next appointment:   1 year(s)  Provider:   Shelda Bruckner, MD    We recommend signing up for the patient portal called MyChart.  Sign up information is provided on this After Visit Summary.  MyChart is used to connect with patients for Virtual Visits (Telemedicine).  Patients are able to view lab/test results, encounter notes, upcoming appointments, etc.  Non-urgent messages can be sent to your provider as well.   To learn more about what you can do with MyChart, go to [email or web address].   Other Instructions  Your physician wants you to follow-up in: 1 year.  You will receive a reminder letter in the mail two months in advance. If you don't receive a letter, please call our office to schedule the follow-up appointment.

## 2024-05-01 ENCOUNTER — Ambulatory Visit: Payer: Self-pay | Admitting: Cardiology

## 2024-05-05 ENCOUNTER — Ambulatory Visit (INDEPENDENT_AMBULATORY_CARE_PROVIDER_SITE_OTHER): Admitting: Internal Medicine

## 2024-05-05 ENCOUNTER — Encounter: Payer: Self-pay | Admitting: Internal Medicine

## 2024-05-05 VITALS — BP 118/78 | HR 80 | Ht 62.0 in | Wt 157.0 lb

## 2024-05-05 DIAGNOSIS — K449 Diaphragmatic hernia without obstruction or gangrene: Secondary | ICD-10-CM | POA: Diagnosis not present

## 2024-05-05 DIAGNOSIS — R09A2 Foreign body sensation, throat: Secondary | ICD-10-CM | POA: Diagnosis not present

## 2024-05-05 DIAGNOSIS — K219 Gastro-esophageal reflux disease without esophagitis: Secondary | ICD-10-CM

## 2024-05-05 DIAGNOSIS — R112 Nausea with vomiting, unspecified: Secondary | ICD-10-CM | POA: Diagnosis not present

## 2024-05-05 NOTE — Patient Instructions (Addendum)
 You have been scheduled for an endoscopy. Please follow written instructions given to you at your visit today.  If you use inhalers (even only as needed), please bring them with you on the day of your procedure.  If you take any of the following medications, they will need to be adjusted prior to your procedure:   DO NOT TAKE 7 DAYS PRIOR TO TEST- Trulicity (dulaglutide) Ozempic , Wegovy  (semaglutide ) Mounjaro  (tirzepatide ) Bydureon Bcise (exanatide extended release)  DO NOT TAKE 1 DAY PRIOR TO YOUR TEST Rybelsus  (semaglutide ) Adlyxin (lixisenatide) Victoza (liraglutide) Byetta (exanatide) ___________________________________________________________________________   _______________________________________________________  If your blood pressure at your visit was 140/90 or greater, please contact your primary care physician to follow up on this.  _______________________________________________________  If you are age 32 or older, your body mass index should be between 23-30. Your Body mass index is 28.72 kg/m. If this is out of the aforementioned range listed, please consider follow up with your Primary Care Provider.  If you are age 33 or younger, your body mass index should be between 19-25. Your Body mass index is 28.72 kg/m. If this is out of the aformentioned range listed, please consider follow up with your Primary Care Provider.   ________________________________________________________  The [facility] providers would like to encourage you to use MYCHART to communicate with providers for non-urgent requests or questions.  Due to long hold times on the telephone, sending your provider a message by [facility] may be a faster and more efficient way to get a response.  Please allow 48 business hours for a response.  Please remember that this is for non-urgent requests.  _______________________________________________________  [facility] is using a team-based approach  to care.  Your team is made up of your doctor and two to three APPS. Our APPS (Nurse Practitioners and Physician Assistants) work with your physician to ensure care continuity for you. They are fully qualified to address your health concerns and develop a treatment plan. They communicate directly with your gastroenterologist to care for you. Seeing the Advanced Practice Practitioners on your physician's team can help you by facilitating care more promptly, often allowing for earlier appointments, access to diagnostic testing, procedures, and other specialty referrals.    Thank you for entrusting me with your care and for choosing [facility], Dr. Estefana Kidney

## 2024-05-05 NOTE — Progress Notes (Signed)
 Chief Complaint: Globus  HPI : 53 year old female with history of DM, GERD, and migraines presents with globus  She has had a sensation of a lump in her throat. The last time that she had this sensation, it was attributed to vocal cord paralysis several years ago, which improved with use of prednisone . She has lost about 80 lbs over the last 2 years. She does have acid reflux. Certain foods would cause more choking. Sometimes within 10 minutes of eating, she will have vomiting. She avoids fried foods. Four weeks ago she started developing the sensation of something in her throat. Her PCP prescribed a prednisone  taper, which dramatically helped with her symptoms. However, she has subsequently had a recurrence of the globus sensation. When she swallows, she will feel a lump in her throat. Endorses a raspy voice. Years ago she had a swallow study that just showed a small hiatal hernia. Denies prior EGD. Sister had IBS. No other family history of GI issues. Denies black stools or hematochezia. Denies prior tobacco use. Denies alcohol use. She has cut back on caffeine. Ever since she had her gallbladder removed, sometimes she will have cramping in her RUQ. This is not very bothersome. Endorses burning and regurgitation as manifestations of her GERD. She takes Pepcid  every day. She does use a reflux pillow. 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 She does not feel that she is particularly stressed at this time.  Past Medical History:  Diagnosis Date   Adenoma of right adrenal gland 10/12/2018   Noted on MRI Abd, measuring 2.2 x 2.4 x 2.7 cm   Anxiety    Aortic atherosclerosis (HCC) 10/10/2018   Noted on US    Diabetes mellitus without complication (HCC)    Fatty liver 10/12/2018   Noted on MRI Abd   Fatty liver 09/29/2020   GERD (gastroesophageal reflux disease) 09/16/2015   Mod. 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/2018   Hyperechoic mass mid left Montes measuring 1.0 x 1.0 x 1.2 cm, noted on US  AB   Nephrolithiasis 10/12/2018   Right, Noted on MRI Abd   Pelvic pain    Chronic, right lower quadrant   Pyelonephritis 11/23/2022   Syncope 11/24/2022   Wears contact lenses    Wears glasses      Past Surgical History:  Procedure Laterality Date   ABDOMINAL HYSTERECTOMY     partial   CHOLECYSTECTOMY N/A 11/18/2020   Procedure: LAPAROSCOPIC CHOLECYSTECTOMY;  Surgeon: Vernetta Berg, MD;  Location: [facility];  Service: General;  Laterality: N/A;   ENDOMETRIAL ABLATION     Pelvic   HOLMIUM LASER APPLICATION Right 11/15/2018   Procedure: HOLMIUM LASER APPLICATION;  Surgeon: Nieves Cough, MD;  Location: [facility];  Service: Urology;  Laterality: Right;   URETEROSCOPY WITH HOLMIUM LASER LITHOTRIPSY Right 11/15/2018   Procedure: CYSTOSCOPY, RETROGRADE, URETEROSCOPY WITH HOLMIUM LASER LITHOTRIPSY/STENT PLACEMENT;  Surgeon: Nieves Cough, MD;  Location: [facility];  Service: Urology;  Laterality: Right;  VAGINAL HYSTERECTOMY  2007   Partial   WISDOM TOOTH EXTRACTION     Family History  Problem Relation Age of Onset   Diabetes Mother        [name] (recent diagnosis) causes thrombocytosis   Heart attack Mother 29       died from sepsis/osteomyelitis   Heart disease Mother    Seizures Mother    Dementia Mother    Hypertension Father    COPD Father    Bipolar disorder Father    Parkinson's disease Father    Cervical cancer Sister    Alcohol abuse Brother    COPD Brother    Hyperlipidemia Brother    Hypertension Brother    Stroke Brother     Heart attack Brother    COPD Maternal Grandmother    Hyperlipidemia Maternal Grandmother    Heart attack Maternal Grandmother    Depression Paternal Grandmother    Heart disease Paternal Grandmother    Heart attack Paternal Grandmother    Colon cancer Neg Hx    Esophageal cancer Neg Hx    Stomach cancer Neg Hx    Rectal cancer Neg Hx    Social History   Tobacco Use   Smoking status: Never   Smokeless tobacco: Never  Vaping Use   Vaping status: Never Used  Substance Use Topics   Alcohol use: Yes    Comment: occ   Drug use: No   Current Outpatient Medications  Medication Sig Dispense Refill   DULoxetine  (CYMBALTA ) 60 MG capsule Take 1 capsule (60 mg total) by mouth daily. 90 capsule 0   fenofibrate  (TRICOR ) 145 MG tablet Take 1 tablet (145 mg total) by mouth daily. 90 tablet 3   indapamide  (LOZOL ) 1.25 MG tablet Take 1 tablet (1.25 mg total) by mouth every morning. 90 tablet 3   Multiple Vitamin (MULTIVITAMIN ADULT PO)      rosuvastatin  (CRESTOR ) 20 MG tablet Take 1 tablet (20 mg total) by mouth daily. 90 tablet 3   tirzepatide  (MOUNJARO ) 10 MG/0.5ML Pen Inject 10 mg into the skin once a week. 2 mL 11   traZODone  (DESYREL ) 50 MG tablet Take 0.5-1 tablets (25-50 mg total) by mouth at bedtime as needed for sleep. 30 tablet 1   indapamide  (LOZOL ) 1.25 MG tablet Take 1 tablet (1.25 mg total) by mouth every morning. (Patient not taking: Reported on 04/23/2024) 90 tablet 3   traZODone  (DESYREL ) 50 MG tablet Take 1/2-1 tablet (25-50 mg total) by mouth at bedtime as needed for sleep. 90 tablet 0   No current facility-administered medications for this visit.   Allergies  Allergen Reactions   Cephalexin Hives, Nausea Only and Rash   Review of Systems: All systems reviewed and negative except where noted in HPI.   Physical Exam: BP 118/78   Pulse 80   Ht 5' 2 (1.575 m)   Wt 157 lb (71.2 kg)   BMI 28.72 kg/m  Constitutional: Pleasant,well-developed, female in no acute  distress. HEENT: Normocephalic and atraumatic. Conjunctivae are normal. No scleral icterus. Cardiovascular: Normal rate, regular rhythm.  Pulmonary/chest: Effort normal and breath sounds normal. No wheezing, rales or rhonchi. Abdominal: Soft, nondistended, nontender. Bowel sounds active throughout. There are no masses palpable. No hepatomegaly. Extremities: No edema Neurological: Alert and oriented to person place and time. Skin: Skin is warm and dry. No rashes noted. Psychiatric: Normal mood and affect. Behavior is normal.  Labs 01/2024: CBC nml. CMP with nml LFTs, mildly elevated Cr of 1.12, and mildly low  K of 3.2.   Labs 04/2024: BMP with mildly elevated Cr of 1.21, nml K. ALT nml at 16.   Upper GI series 09/16/15: IMPRESSION: 1. Small hiatal hernia with moderate gastroesophageal reflux. Barium pill passes into the stomach without delay. 2. No abnormality of the stomach or duodenum is seen. 3. A calcification overlies the lower pole of the right Montes consistent with a 10 mm right lower pole renal calculus.  GES 09/20/15: IMPRESSION: Normal gastric emptying study.  MR abdomen w/contrast 10/12/18: IMPRESSION: 1. Right-sided nephrolithiasis with mild right hydronephrosis. 2. Hepatic steatosis 3. Right adrenal gland adenoma. 4. No suspicious Montes mass identified bilaterally.  CT C/A/P w/contrast 11/23/22: IMPRESSION: Findings consistent with right pyelonephritis. No definite abscess is noted. Hepatic steatosis. 11 x 7 mm subpleural density is noted medially in the right upper lobe which may simply represent atelectasis or focal inflammation, but neoplasm cannot be excluded. Consider one of the following in 3 months for both low-risk and high-risk individuals: (a) repeat chest CT, (b) follow-up PET-CT, or (c) tissue sampling. This recommendation follows the consensus statement: Guidelines for Management of Incidental Pulmonary Nodules Detected on CT Images: From the  Fleischner Society 2017; Radiology 2017; 284:228-243. Stable right adrenal adenoma.  Colonoscopy 03/07/22: - The examined portion of the ileum was normal. - Three 2 to 4 mm polyps in the sigmoid colon, in the descending colon and in the transverse colon, removed with a cold snare. Resected and retrieved. - Diverticulosis in the sigmoid colon and in the descending colon. - Non- bleeding internal hemorrhoids. Path: Surgical [P], colon, sigmoid, descending and transverse, polyp (3) - COLONIC MUCOSA WITH PROMINENT LYMPHOID AGGREGATES.  ASSESSMENT AND PLAN: Globus GERD  N&V Hoarse voice Small hiatal hernia Fatty liver Patients with a globus sensation in her throat, which is associated with a raspy throat as well as occasional N&V. She does take Pepcid  every day and does feel that she has some uncontrolled acid reflux. GERD may be a contributor to her symptoms but would want to rule out eosinophilic esophagitis, reflux esophagitis, and/or infectious esophagitis. Patient previously had a nml GES, though this was done in 2016 so may be worth repeating in order to rule out gastroparesis as a contributor to her symptoms. Patient has previously been noted to have fatty liver on imaging in the past. FIB-4 is 0.8, excluding advanced fibrosis. Patient has deliberately lost a dramatic amount of weight over the last 2 years, which should help with fatty liver. Patient's use of Mounjaro  may be contributing to issues with uncontrolled acid reflux. - GERD handout - Continue Pepcid  QD - EGD [facility]  Jane Kidney, MD  I spent 40 minutes of time, including in depth chart review, independent review of results as outlined above, communicating results with the patient directly, face-to-face time with the patient, coordinating care, and ordering studies and medications as appropriate, and documentation.

## 2024-05-07 ENCOUNTER — Encounter: Payer: Self-pay | Admitting: Internal Medicine

## 2024-05-08 DIAGNOSIS — F4323 Adjustment disorder with mixed anxiety and depressed mood: Secondary | ICD-10-CM | POA: Diagnosis not present

## 2024-05-15 ENCOUNTER — Other Ambulatory Visit (HOSPITAL_COMMUNITY): Payer: Self-pay

## 2024-05-15 ENCOUNTER — Encounter: Payer: Self-pay | Admitting: Internal Medicine

## 2024-05-15 ENCOUNTER — Ambulatory Visit: Admitting: Internal Medicine

## 2024-05-15 VITALS — BP 126/78 | HR 67 | Temp 97.8°F | Resp 18 | Ht 62.0 in | Wt 157.0 lb

## 2024-05-15 DIAGNOSIS — E7800 Pure hypercholesterolemia, unspecified: Secondary | ICD-10-CM | POA: Diagnosis not present

## 2024-05-15 DIAGNOSIS — K760 Fatty (change of) liver, not elsewhere classified: Secondary | ICD-10-CM | POA: Diagnosis not present

## 2024-05-15 DIAGNOSIS — R09A2 Foreign body sensation, throat: Secondary | ICD-10-CM

## 2024-05-15 DIAGNOSIS — K219 Gastro-esophageal reflux disease without esophagitis: Secondary | ICD-10-CM

## 2024-05-15 DIAGNOSIS — K2289 Other specified disease of esophagus: Secondary | ICD-10-CM | POA: Diagnosis not present

## 2024-05-15 DIAGNOSIS — F419 Anxiety disorder, unspecified: Secondary | ICD-10-CM | POA: Diagnosis not present

## 2024-05-15 DIAGNOSIS — K3189 Other diseases of stomach and duodenum: Secondary | ICD-10-CM

## 2024-05-15 DIAGNOSIS — K2270 Barrett's esophagus without dysplasia: Secondary | ICD-10-CM

## 2024-05-15 DIAGNOSIS — K2100 Gastro-esophageal reflux disease with esophagitis, without bleeding: Secondary | ICD-10-CM | POA: Diagnosis not present

## 2024-05-15 DIAGNOSIS — R112 Nausea with vomiting, unspecified: Secondary | ICD-10-CM

## 2024-05-15 MED ORDER — SODIUM CHLORIDE 0.9 % IV SOLN: 500.0000 mL | Freq: Once | INTRAVENOUS | Status: DC

## 2024-05-15 MED ORDER — PANTOPRAZOLE SODIUM 40 MG PO TBEC
40.0000 mg | DELAYED_RELEASE_TABLET | Freq: Every day | ORAL | 3 refills | Status: AC
  Filled 2024-05-15: qty 90, 90d supply, fill #0
  Filled 2024-08-03: qty 90, 90d supply, fill #1
  Filled 2024-09-25 – 2024-11-06 (×2): qty 90, 90d supply, fill #2

## 2024-05-15 NOTE — Progress Notes (Signed)
 Called to room to assist during endoscopic procedure.  Patient ID and intended procedure confirmed with present staff. Received instructions for my participation in the procedure from the performing physician.

## 2024-05-15 NOTE — Op Note (Signed)
 [facility] Patient Name: Jane Montes Procedure Date: 05/15/2024 4:17 PM MRN: [id number] Endoscopist: Rosario Estefana Kidney , , [id number] Age: 53 Referring MD:  Date of Birth: 1971/06/07 Gender: Female Account #: [id number] Procedure:                Upper GI endoscopy Indications:              Heartburn, Globus sensation, Nausea with vomiting Medicines:                Monitored Anesthesia Care Procedure:                Pre-Anesthesia Assessment:                           - Prior to the procedure, a History and Physical                            was performed, and patient medications and                            allergies were reviewed. The patient's tolerance of                            previous anesthesia was also reviewed. The risks                            and benefits of the procedure and the sedation                            options and risks were discussed with the patient.                            All questions were answered, and informed consent                            was obtained. Prior Anticoagulants: The patient has                            taken no anticoagulant or antiplatelet agents. ASA                            Grade Assessment: II - A patient with mild systemic                            disease. After reviewing the risks and benefits,                            the patient was deemed in satisfactory condition to                            undergo the procedure.                           After obtaining informed consent, the endoscope was  passed under direct vision. Throughout the                            procedure, the patient's blood pressure, pulse, and                            oxygen saturations were monitored continuously. The                            Olympus Scope [id number] was introduced through the                            mouth, and advanced to the second part of duodenum.                             The upper GI endoscopy was accomplished without                            difficulty. The patient tolerated the procedure                            well. Scope In: Scope Out: Findings:                 Salmon-colored mucosa was present. The maximum                            longitudinal extent of these esophageal mucosal                            changes was 1 cm in length. Mucosa was biopsied                            with a cold forceps for histology. One specimen                            bottle was sent to pathology.                           Biopsies were taken with a cold forceps in the                            proximal esophagus and in the distal esophagus for                            histology.                           Multiple localized erosions with stigmata of recent                            bleeding were found in the gastric fundus and in                            the gastric antrum. Biopsies  were taken with a cold                            forceps for histology.                           The examined duodenum was normal. Complications:            No immediate complications. Estimated Blood Loss:     Estimated blood loss was minimal. Impression:               - Salmon-colored mucosa classified as Barrett's                            stage C0-M1 per Prague criteria. Biopsied.                           - Erosive gastropathy with stigmata of recent                            bleeding. Biopsied.                           - Normal examined duodenum.                           - Biopsies were taken with a cold forceps for                            histology in the proximal esophagus and in the                            distal esophagus. Recommendation:           - Discharge patient to home (with escort).                           - Await pathology results.                           - Start pantoprazole  40 mg daily.                           - Return to GI clinic  in 2-3 months.                           - The findings and recommendations were discussed                            with the patient. Dr Estefana Federico Rosario Estefana Federico,  05/15/2024 4:43:19 PM

## 2024-05-15 NOTE — Progress Notes (Unsigned)
 Report given to PACU, vss

## 2024-05-15 NOTE — Patient Instructions (Signed)
 Handout provided on Barrett's esophagus and eosinophilic esophagitis.  Await pathology results.  Continue present medications.  Start pantoprazole  40mg  daily  Resume previous diet.  Return to GI clinic in 2-3 months. See appointment details.   YOU HAD AN ENDOSCOPIC PROCEDURE TODAY AT THE [facility]:   Refer to the procedure report that was given to you for any specific questions about what was found during the examination.  If the procedure report does not answer your questions, please call your gastroenterologist to clarify.  If you requested that your care partner not be given the details of your procedure findings, then the procedure report has been included in a sealed envelope for you to review at your convenience later.  YOU SHOULD EXPECT: Some feelings of bloating in the abdomen. Passage of more gas than usual.  Walking can help get rid of the air that was put into your GI tract during the procedure and reduce the bloating. If you had a lower endoscopy (such as a colonoscopy or flexible sigmoidoscopy) you may notice spotting of blood in your stool or on the toilet paper. If you underwent a bowel prep for your procedure, you may not have a normal bowel movement for a few days.  Please Note:  You might notice some irritation and congestion in your nose or some drainage.  This is from the oxygen used during your procedure.  There is no need for concern and it should clear up in a day or so.  SYMPTOMS TO REPORT IMMEDIATELY:  Following upper endoscopy (EGD)  Vomiting of blood or coffee ground material  New chest pain or pain under the shoulder blades  Painful or persistently difficult swallowing  New shortness of breath  Fever of 100F or higher  Black, tarry-looking stools  For urgent or emergent issues, a gastroenterologist can be reached at any hour by calling [phone]. Do not use MyChart messaging for urgent concerns.    DIET:  We do recommend a small meal at  first, but then you may proceed to your regular diet.  Drink plenty of fluids but you should avoid alcoholic beverages for 24 hours.  ACTIVITY:  You should plan to take it easy for the rest of today and you should NOT DRIVE or use heavy machinery until tomorrow (because of the sedation medicines used during the test).    FOLLOW UP: Our staff will call the number listed on your records the next business day following your procedure.  We will call around 7:15- 8:00 am to check on you and address any questions or concerns that you may have regarding the information given to you following your procedure. If we do not reach you, we will leave a message.     If any biopsies were taken you will be contacted by phone or by letter within the next 1-3 weeks.  Please call us  at [phone] if you have not heard about the biopsies in 3 weeks.    SIGNATURES/CONFIDENTIALITY: You and/or your care partner have signed paperwork which will be entered into your electronic medical record.  These signatures attest to the fact that that the information above on your After Visit Summary has been reviewed and is understood.  Full responsibility of the confidentiality of this discharge information lies with you and/or your care-partner.

## 2024-05-15 NOTE — Progress Notes (Unsigned)
 1628 Robinul 0.1 mg IV given due large amount of secretions upon assessment.  MD made aware, vss

## 2024-05-15 NOTE — Progress Notes (Unsigned)
 GASTROENTEROLOGY PROCEDURE H&P NOTE   Primary Care Physician: Daryl Setter, NP    Reason for Procedure:   Globus, GERD, N&V  Plan:    EGD  Patient is appropriate for endoscopic procedure(s) in the ambulatory ([facility]) setting.  The nature of the procedure, as well as the risks, benefits, and alternatives were carefully and thoroughly reviewed with the patient. Ample time for discussion and questions allowed. The patient understood, was satisfied, and agreed to proceed.     HPI: Jane Montes is a 53 y.o. female who presents for EGD for evaluation of globus, GERD, and N&V.  Patient was most recently seen in the Gastroenterology Clinic on 05/05/24.  No interval change in medical history since that appointment. Please refer to that note for full details regarding GI history and clinical presentation.   Past Medical History:  Diagnosis Date   Adenoma of right adrenal gland 10/12/2018   Noted on MRI Abd, measuring 2.2 x 2.4 x 2.7 cm   Anxiety    Aortic atherosclerosis (HCC) 10/10/2018   Noted on US    Diabetes mellitus without complication (HCC)    Fatty liver 10/12/2018   Noted on MRI Abd   Fatty liver 09/29/2020   GERD (gastroesophageal reflux disease) 09/16/2015   Mod. noted on KUB   Heart palpitations    High cholesterol    History of chest pain    History of dizziness    History of hiatal hernia 09/16/2015   Small, noted on KUB   History of kidney stones    History of migraine    Hydronephrosis of right kidney 10/12/2018   Mild, noted on MRI Abd   Hyperlipidemia    Left anterior fascicular block (LAFB) 01/10/2013   noted on EKG   Left kidney mass 10/12/2018   Hyperechoic mass mid left kidney measuring 1.0 x 1.0 x 1.2 cm, noted on US  AB   Nephrolithiasis 10/12/2018   Right, Noted on MRI Abd   Pelvic pain    Chronic, right lower quadrant   Pyelonephritis 11/23/2022   Syncope 11/24/2022   Wears contact lenses    Wears glasses     Past Surgical History:   Procedure Laterality Date   ABDOMINAL HYSTERECTOMY     partial   CHOLECYSTECTOMY N/A 11/18/2020   Procedure: LAPAROSCOPIC CHOLECYSTECTOMY;  Surgeon: Vernetta Berg, MD;  Location: [facility];  Service: General;  Laterality: N/A;   ENDOMETRIAL ABLATION     Pelvic   HOLMIUM LASER APPLICATION Right 11/15/2018   Procedure: HOLMIUM LASER APPLICATION;  Surgeon: Nieves Cough, MD;  Location: [facility];  Service: Urology;  Laterality: Right;   URETEROSCOPY WITH HOLMIUM LASER LITHOTRIPSY Right 11/15/2018   Procedure: CYSTOSCOPY, RETROGRADE, URETEROSCOPY WITH HOLMIUM LASER LITHOTRIPSY/STENT PLACEMENT;  Surgeon: Nieves Cough, MD;  Location: [facility];  Service: Urology;  Laterality: Right;   VAGINAL HYSTERECTOMY  2007   Partial   WISDOM TOOTH EXTRACTION      Prior to Admission medications   Medication Sig Start Date End Date Taking? Authorizing Provider  DULoxetine  (CYMBALTA ) 60 MG capsule Take 1 capsule (60 mg total) by mouth daily. 03/04/24     fenofibrate  (TRICOR ) 145 MG tablet Take 1 tablet (145 mg total) by mouth daily. 02/05/24   Nahser, Aleene PARAS, MD  indapamide  (LOZOL ) 1.25 MG tablet Take 1 tablet (1.25 mg total) by mouth every morning. Patient not taking: Reported on 04/23/2024 12/26/22     indapamide  (LOZOL ) 1.25 MG tablet Take 1 tablet (1.25  mg total) by mouth every morning. 01/24/24     Multiple Vitamin (MULTIVITAMIN ADULT PO)     [provider]  rosuvastatin  (CRESTOR ) 20 MG tablet Take 1 tablet (20 mg total) by mouth daily. 02/05/24   Nahser, Aleene PARAS, MD  tirzepatide  (MOUNJARO ) 10 MG/0.5ML Pen Inject 10 mg into the skin once a week. 01/15/24   Nahser, Aleene PARAS, MD  traZODone  (DESYREL ) 50 MG tablet Take 0.5-1 tablets (25-50 mg total) by mouth at bedtime as needed for sleep. 01/29/24     traZODone  (DESYREL ) 50 MG tablet Take 1/2-1 tablet (25-50 mg total) by mouth at bedtime as needed for sleep. 03/04/24       Current Outpatient  Medications  Medication Sig Dispense Refill   DULoxetine  (CYMBALTA ) 60 MG capsule Take 1 capsule (60 mg total) by mouth daily. 90 capsule 0   fenofibrate  (TRICOR ) 145 MG tablet Take 1 tablet (145 mg total) by mouth daily. 90 tablet 3   Multiple Vitamin (MULTIVITAMIN ADULT PO)      rosuvastatin  (CRESTOR ) 20 MG tablet Take 1 tablet (20 mg total) by mouth daily. 90 tablet 3   traZODone  (DESYREL ) 50 MG tablet Take 0.5-1 tablets (25-50 mg total) by mouth at bedtime as needed for sleep. 30 tablet 1   traZODone  (DESYREL ) 50 MG tablet Take 1/2-1 tablet (25-50 mg total) by mouth at bedtime as needed for sleep. 90 tablet 0   indapamide  (LOZOL ) 1.25 MG tablet Take 1 tablet (1.25 mg total) by mouth every morning. (Patient not taking: Reported on 04/23/2024) 90 tablet 3   indapamide  (LOZOL ) 1.25 MG tablet Take 1 tablet (1.25 mg total) by mouth every morning. (Patient not taking: Reported on 05/15/2024) 90 tablet 3   tirzepatide  (MOUNJARO ) 10 MG/0.5ML Pen Inject 10 mg into the skin once a week. 2 mL 11   Current Facility-Administered Medications  Medication Dose Route Frequency Provider Last Rate Last Admin   0.9 %  sodium chloride  infusion  500 mL Intravenous Once Federico Rosario BROCKS, MD        Allergies as of 05/15/2024 - Review Complete 05/15/2024  Allergen Reaction Noted   Cephalexin Hives, Nausea Only, and Rash 11/21/2019    Family History  Problem Relation Age of Onset   Diabetes Mother        [name] (recent diagnosis) causes thrombocytosis   Heart attack Mother 33       died from sepsis/osteomyelitis   Heart disease Mother    Seizures Mother    Dementia Mother    Hypertension Father    COPD Father    Bipolar disorder Father    Parkinson's disease Father    Cervical cancer Sister    Alcohol abuse Brother    COPD Brother    Hyperlipidemia Brother    Hypertension Brother    Stroke Brother    Heart attack Brother    COPD Maternal Grandmother    Hyperlipidemia Maternal Grandmother     Heart attack Maternal Grandmother    Depression Paternal Grandmother    Heart disease Paternal Grandmother    Heart attack Paternal Grandmother    Colon cancer Neg Hx    Esophageal cancer Neg Hx    Stomach cancer Neg Hx    Rectal cancer Neg Hx     Social History   Socioeconomic History   Marital status: Divorced    Spouse name: Not on file   Number of children: Not on file   Years of education: Not on file   Highest  education level: Not on file  Occupational History   Occupation: Nurse    Employer: [employer]  Tobacco Use   Smoking status: Never   Smokeless tobacco: Never  Vaping Use   Vaping status: Never Used  Substance and Sexual Activity   Alcohol use: Yes    Comment: occ   Drug use: No   Sexual activity: Not Currently    Birth control/protection: Surgical  Other Topics Concern   Not on file  Social History Narrative   Works for Heart failure team at [employer]   2 daughters   Divorced   Enjoys reading/working out, spending time outdoors.     Social Drivers of Corporate investment banker Strain: Low Risk  (04/02/2018)   Overall Financial Resource Strain (CARDIA)    Difficulty of Paying Living Expenses: Not hard at all  Food Insecurity: No Food Insecurity (11/23/2022)   Hunger Vital Sign    Worried About Running Out of Food in the Last Year: Never true    Ran Out of Food in the Last Year: Never true  Transportation Needs: No Transportation Needs (11/23/2022)   PRAPARE - Administrator, Civil Service (Medical): No    Lack of Transportation (Non-Medical): No  Physical Activity: Sufficiently Active (04/02/2018)   Exercise Vital Sign    Days of Exercise per Week: 3 days    Minutes of Exercise per Session: 60 min  Stress: Stress Concern Present (04/02/2018)   [employer] of Occupational Health - Occupational Stress Questionnaire    Feeling of Stress : Rather much  Social Connections: Moderately Integrated (04/02/2018)   Social Connection and Isolation  Panel    Frequency of Communication with Friends and Family: More than three times a week    Frequency of Social Gatherings with Friends and Family: Three times a week    Attends Religious Services: More than 4 times per year    Active Member of Clubs or Organizations: Yes    Attends Banker Meetings: More than 4 times per year    Marital Status: Separated  Intimate Partner Violence: Not At Risk (11/23/2022)   Humiliation, Afraid, Rape, and Kick questionnaire    Fear of Current or Ex-Partner: No    Emotionally Abused: No    Physically Abused: No    Sexually Abused: No    Physical Exam: Vital signs in last 24 hours: BP 124/83   Pulse 66   Temp 97.8 F (36.6 C) (Temporal)   Ht 5' 2 (1.575 m)   Wt 157 lb (71.2 kg)   SpO2 99%   BMI 28.72 kg/m  GEN: NAD EYE: Sclerae anicteric ENT: MMM CV: Non-tachycardic Pulm: No increased WOB GI: Soft NEURO:  Alert & Oriented   Estefana Kidney, MD [facility]   05/15/2024 3:38 PM

## 2024-05-16 ENCOUNTER — Telehealth: Payer: Self-pay

## 2024-05-16 NOTE — Telephone Encounter (Signed)
  Follow up Call-     05/15/2024    3:09 PM 03/07/2022    9:01 AM  Call back number  Post procedure Call Back phone  # [phone]  Permission to leave phone message Yes Yes     Patient questions:  Do you have a fever, pain , or abdominal swelling? No. Pain Score  0 *  Have you tolerated food without any problems? Yes.    Have you been able to return to your normal activities? Yes.    Do you have any questions about your discharge instructions: Diet   No. Medications  No. Follow up visit  No.  Do you have questions or concerns about your Care? No.  Actions: * If pain score is 4 or above: No action needed, pain <4.

## 2024-05-19 ENCOUNTER — Telehealth: Payer: Self-pay | Admitting: Family

## 2024-05-19 NOTE — Telephone Encounter (Signed)
 Electronic request sent

## 2024-05-19 NOTE — Telephone Encounter (Signed)
 Please request pap result from Dr. Okey.

## 2024-05-21 LAB — SURGICAL PATHOLOGY

## 2024-05-22 ENCOUNTER — Ambulatory Visit: Payer: Self-pay | Admitting: Internal Medicine

## 2024-05-30 DIAGNOSIS — F4323 Adjustment disorder with mixed anxiety and depressed mood: Secondary | ICD-10-CM | POA: Diagnosis not present

## 2024-06-03 ENCOUNTER — Other Ambulatory Visit (HOSPITAL_COMMUNITY): Payer: Self-pay

## 2024-06-03 DIAGNOSIS — F338 Other recurrent depressive disorders: Secondary | ICD-10-CM | POA: Diagnosis not present

## 2024-06-03 DIAGNOSIS — F411 Generalized anxiety disorder: Secondary | ICD-10-CM | POA: Diagnosis not present

## 2024-06-03 MED ORDER — ALPRAZOLAM 0.5 MG PO TABS
0.5000 mg | ORAL_TABLET | Freq: Every day | ORAL | 1 refills | Status: DC | PRN
  Filled 2024-06-03: qty 10, 10d supply, fill #0
  Filled 2024-09-25: qty 10, 10d supply, fill #1

## 2024-06-03 MED ORDER — DULOXETINE HCL 60 MG PO CPEP
60.0000 mg | ORAL_CAPSULE | Freq: Every day | ORAL | 0 refills | Status: DC
  Filled 2024-06-03 – 2024-06-15 (×2): qty 90, 90d supply, fill #0

## 2024-06-03 MED ORDER — TRAZODONE HCL 50 MG PO TABS
25.0000 mg | ORAL_TABLET | Freq: Every evening | ORAL | 0 refills | Status: DC | PRN
  Filled 2024-08-03: qty 90, 90d supply, fill #0

## 2024-06-10 ENCOUNTER — Other Ambulatory Visit (HOSPITAL_COMMUNITY): Payer: Self-pay

## 2024-06-11 DIAGNOSIS — F4323 Adjustment disorder with mixed anxiety and depressed mood: Secondary | ICD-10-CM | POA: Diagnosis not present

## 2024-06-16 ENCOUNTER — Other Ambulatory Visit (HOSPITAL_COMMUNITY): Payer: Self-pay

## 2024-06-27 DIAGNOSIS — F4323 Adjustment disorder with mixed anxiety and depressed mood: Secondary | ICD-10-CM | POA: Diagnosis not present

## 2024-07-04 DIAGNOSIS — Z1231 Encounter for screening mammogram for malignant neoplasm of breast: Secondary | ICD-10-CM | POA: Diagnosis not present

## 2024-07-04 DIAGNOSIS — F4323 Adjustment disorder with mixed anxiety and depressed mood: Secondary | ICD-10-CM | POA: Diagnosis not present

## 2024-07-04 DIAGNOSIS — Z01419 Encounter for gynecological examination (general) (routine) without abnormal findings: Secondary | ICD-10-CM | POA: Diagnosis not present

## 2024-07-04 LAB — HM MAMMOGRAPHY

## 2024-07-11 DIAGNOSIS — F4323 Adjustment disorder with mixed anxiety and depressed mood: Secondary | ICD-10-CM | POA: Diagnosis not present

## 2024-07-14 NOTE — Progress Notes (Unsigned)
 07/15/2024 [name] [id number] 08/11/1971  Referring provider: Daryl Setter, NP Primary GI doctor: Dr. Federico  ASSESSMENT AND PLAN:  GERD with Barrett's esophagus 05/2024, nausea vomiting, small HH, globus sensation 2016 GES normal 05/15/2024 EGD salmon-colored mucosa classified as Barrett's stage C0 M1, erosive gastropathy with stigmata of reading bleeding normal examined duodenum.  Pathology negative H. pylori, esophageal squamous and cardiac mucosa with intestinal metaplasia consistent with Barrett's esophagus Recall 5 years - continue to cut back on mounjaro  - on protonix  40 mg once a day -alginate therapy given -Lifestyle changes discussed, avoid NSAIDS, ETOH, hand out given to the patient - consider barium swallow/manometry - discussed possible TIF procedure, given information, would need barium swallow/manometry and patient does have small HH would likely need hernia repair as well. She is not interested in pursuing this at this time but given information.   Hepatic steatosis    Latest Ref Rng & Units 04/21/2024   11:57 AM 01/14/2024   10:06 AM 12/28/2023    1:37 PM  Hepatic Function  Total Protein 6.5 - 8.1 g/dL  7.1  6.9   Albumin 3.5 - 5.0 g/dL  4.1  4.5   AST 15 - 41 U/L  24  19   ALT 0 - 32 IU/L 16  18  15    Alk Phosphatase 38 - 126 U/L  39  52   Total Bilirubin 0.0 - 1.2 mg/dL  0.5  0.5    Platelets 373  Seen on MRI abdomen 2020 and CT 11/23/2022 Weight loss on Mounjaro , has lost 80 lbs starting to cut back on mounjaro  Continue to monitor LFTs, CBC every 6 months Consider elastography in 2-3 years  Screening colonoscopy 03/07/2022 normal TI, 3 polyps 2 to 4 mm sigmoid colon descending transverse, diverticulosis, nonbleeding internal hemorrhoids benign recall 10 years   Patient Care Team: Daryl Setter, NP as PCP - General (Internal Medicine) Lonni Slain, MD as PCP - Cardiology (Cardiology) Okey Leader, MD as Consulting Physician  (Obstetrics and Gynecology)  HISTORY OF PRESENT ILLNESS: 53 y.o. female with a past medical history listed below presents for evaluation of globus sensation.   Last seen by Dr. Federico in the office 05/05/2024 for globus sensation.  Discussed the use of AI scribe software for clinical note transcription with the patient, who gave verbal consent to proceed.  History of Present Illness   Kailah AZAYLEA MAVES is a 53 year old female with Barrett's esophagus and fatty liver who presents for follow-up of her reflux symptoms.  She underwent an endoscopy in August, which confirmed Barrett's esophagus but was negative for H. pylori and malignancy. She has experienced significant weight loss of 80 pounds, which has alleviated her reflux symptoms. Currently, she is on Protonix  40 mg once daily and has had only one episode of reflux since the endoscopy, triggered by spicy food. She avoids caffeine, coffee, spicy foods, and acidic foods to manage her symptoms.  She experiences a sensation of something in her throat, which is less severe than before. Previously, it felt like a 'golf ball' in her throat, similar to when she had vocal cord paralysis, but she did not lose her voice this time. No dysphagia or food getting caught, and no issues with water intake. The sensation lasts about 5-10 minutes, leading to a cough, but this has decreased over the past year.  She is on Mounjaro , which is being tapered. No difference in gastrointestinal symptoms with the dose reduction. She drinks about 100 ounces  of water daily and has no nighttime reflux symptoms, although she uses one or two pillows to sleep. She has a history of a negative gastroparesis study in 2016.  She has a fatty liver noted on MRI. She is diligent about her diet, consulting with a nutritionist to manage her Barrett's esophagus. She limits soda to once a month and coffee to low-acidic options on weekends.      She  reports that she has never smoked. She has  never used smokeless tobacco. She reports current alcohol use. She reports that she does not use drugs.  RELEVANT GI HISTORY, IMAGING AND LABS: Results   RADIOLOGY Liver MRI: Hepatic steatosis  DIAGNOSTIC Endoscopy: Barrett's esophagus, negative for Helicobacter pylori, no malignancy (05/2024)     Upper GI series 09/16/15: IMPRESSION: 1. Small hiatal hernia with moderate gastroesophageal reflux. Barium pill passes into the stomach without delay. 2. No abnormality of the stomach or duodenum is seen. 3. A calcification overlies the lower pole of the right kidney consistent with a 10 mm right lower pole renal calculus.   GES 09/20/15: IMPRESSION: Normal gastric emptying study.   MR abdomen w/contrast 10/12/18: IMPRESSION: 1. Right-sided nephrolithiasis with mild right hydronephrosis. 2. Hepatic steatosis 3. Right adrenal gland adenoma. 4. No suspicious kidney mass identified bilaterally.   CT C/A/P w/contrast 11/23/22: IMPRESSION: Findings consistent with right pyelonephritis. No definite abscess is noted. Hepatic steatosis. 11 x 7 mm subpleural density is noted medially in the right upper lobe which may simply represent atelectasis or focal inflammation, but neoplasm cannot be excluded. Consider one of the following in 3 months for both low-risk and high-risk individuals: (a) repeat chest CT, (b) follow-up PET-CT, or (c) tissue sampling. This recommendation follows the consensus statement: Guidelines for Management of Incidental Pulmonary Nodules Detected on CT Images: From the Fleischner Society 2017; Radiology 2017; 284:228-243. Stable right adrenal adenoma.   Colonoscopy 03/07/22: - The examined portion of the ileum was normal. - Three 2 to 4 mm polyps in the sigmoid colon, in the descending colon and in the transverse colon, removed with a cold snare. Resected and retrieved. - Diverticulosis in the sigmoid colon and in the descending colon. - Non- bleeding internal  hemorrhoids. Path: Surgical [P], colon, sigmoid, descending and transverse, polyp (3) - COLONIC MUCOSA WITH PROMINENT LYMPHOID AGGREGATES. CBC    Component Value Date/Time   WBC 6.9 01/14/2024 1006   RBC 4.37 01/14/2024 1006   HGB 13.1 01/14/2024 1006   HCT 38.5 01/14/2024 1006   PLT 373 01/14/2024 1006   MCV 88.1 01/14/2024 1006   MCH 30.0 01/14/2024 1006   MCHC 34.0 01/14/2024 1006   RDW 12.3 01/14/2024 1006   LYMPHSABS 1.8 01/14/2024 1006   MONOABS 0.5 01/14/2024 1006   EOSABS 0.1 01/14/2024 1006   BASOSABS 0.1 01/14/2024 1006   Recent Labs    01/14/24 1006  HGB 13.1    CMP     Component Value Date/Time   NA 138 04/21/2024 1157   K 4.2 04/21/2024 1157   CL 102 04/21/2024 1157   CO2 21 04/21/2024 1157   GLUCOSE 89 04/21/2024 1157   GLUCOSE 83 01/29/2024 0733   BUN 32 (H) 04/21/2024 1157   CREATININE 1.21 (H) 04/21/2024 1157   CREATININE 1.03 11/21/2019 1441   CALCIUM  9.7 04/21/2024 1157   PROT 7.1 01/14/2024 1006   ALBUMIN 4.1 01/14/2024 1006   AST 24 01/14/2024 1006   ALT 16 04/21/2024 1157   ALKPHOS 39 01/14/2024 1006   BILITOT 0.5  01/14/2024 1006   GFRNONAA 59 (L) 01/14/2024 1006   GFRAA 57 (L) 05/02/2020 1205      Latest Ref Rng & Units 04/21/2024   11:57 AM 01/14/2024   10:06 AM 12/28/2023    1:37 PM  Hepatic Function  Total Protein 6.5 - 8.1 g/dL  7.1  6.9   Albumin 3.5 - 5.0 g/dL  4.1  4.5   AST 15 - 41 U/L  24  19   ALT 0 - 32 IU/L 16  18  15    Alk Phosphatase 38 - 126 U/L  39  52   Total Bilirubin 0.0 - 1.2 mg/dL  0.5  0.5       Current Medications:   Current Outpatient Medications (Endocrine & Metabolic):    tirzepatide  (MOUNJARO ) 10 MG/0.5ML Pen, Inject 10 mg into the skin once a week.  Current Outpatient Medications (Cardiovascular):    fenofibrate  (TRICOR ) 145 MG tablet, Take 1 tablet (145 mg total) by mouth daily.   indapamide  (LOZOL ) 1.25 MG tablet, Take 1 tablet (1.25 mg total) by mouth every morning.   rosuvastatin  (CRESTOR ) 20 MG  tablet, Take 1 tablet (20 mg total) by mouth daily.   indapamide  (LOZOL ) 1.25 MG tablet, Take 1 tablet (1.25 mg total) by mouth every morning. (Patient not taking: Reported on 07/15/2024)     Current Outpatient Medications (Other):    ALPRAZolam  (XANAX ) 0.5 MG tablet, Take 1 tablet (0.5 mg total) by mouth daily as needed for extreme anxiety.   DULoxetine  (CYMBALTA ) 60 MG capsule, Take 1 capsule by mouth daily   Multiple Vitamin (MULTIVITAMIN ADULT PO),    pantoprazole  (PROTONIX ) 40 MG tablet, Take 1 tablet (40 mg total) by mouth daily.   traZODone  (DESYREL ) 50 MG tablet, Take 0.5-1 tablets (25-50 mg total) by mouth at bedtime as needed for sleep.   traZODone  (DESYREL ) 50 MG tablet, Take 1/2-1 tablet (25-50 mg total) by mouth at bedtime as needed for sleep. (Patient not taking: Reported on 07/15/2024)  Medical History:  Past Medical History:  Diagnosis Date   Adenoma of right adrenal gland 10/12/2018   Noted on MRI Abd, measuring 2.2 x 2.4 x 2.7 cm   Anxiety    Aortic atherosclerosis 10/10/2018   Noted on US    Diabetes mellitus without complication (HCC)    Fatty liver 10/12/2018   Noted on MRI Abd   Fatty liver 09/29/2020   GERD (gastroesophageal reflux disease) 09/16/2015   Mod. noted on KUB   Heart palpitations    High cholesterol    History of chest pain    History of dizziness    History of hiatal hernia 09/16/2015   Small, noted on KUB   History of kidney stones    History of migraine    Hydronephrosis of right kidney 10/12/2018   Mild, noted on MRI Abd   Hyperlipidemia    Left anterior fascicular block (LAFB) 01/10/2013   noted on EKG   Left kidney mass 10/12/2018   Hyperechoic mass mid left kidney measuring 1.0 x 1.0 x 1.2 cm, noted on US  AB   Nephrolithiasis 10/12/2018   Right, Noted on MRI Abd   Pelvic pain    Chronic, right lower quadrant   Pyelonephritis 11/23/2022   Syncope 11/24/2022   Wears contact lenses    Wears glasses    Allergies:  Allergies   Allergen Reactions   Cephalexin Hives, Nausea Only and Rash     Surgical History:  She  has a past surgical history that  includes Endometrial ablation; Vaginal hysterectomy (2007); Wisdom tooth extraction; Ureteroscopy with holmium laser lithotripsy (Right, 11/15/2018); Holmium laser application (Right, 11/15/2018); Abdominal hysterectomy; and Cholecystectomy (N/A, 11/18/2020). Family History:  Her family history includes Alcohol abuse in her brother; Bipolar disorder in her father; COPD in her brother, father, and maternal grandmother; Cervical cancer in her sister; Dementia in her mother; Depression in her paternal grandmother; Diabetes in her mother; Heart attack in her brother, maternal grandmother, and paternal grandmother; Heart attack (age of onset: 65) in her mother; Heart disease in her mother and paternal grandmother; Hyperlipidemia in her brother and maternal grandmother; Hypertension in her brother and father; Parkinson's disease in her father; Seizures in her mother; Stroke in her brother.  REVIEW OF SYSTEMS  : All other systems reviewed and negative except where noted in the History of Present Illness.  PHYSICAL EXAM: BP 120/78   Pulse 81   Ht 5' 2 (1.575 m)   Wt 164 lb (74.4 kg)   BMI 30.00 kg/m  Physical Exam   GENERAL APPEARANCE: Well nourished, in no apparent distress HEENT: No cervical lymphadenopathy, unremarkable thyroid , sclerae anicteric, conjunctiva pink RESPIRATORY: Respiratory effort normal, BS equal bilateral without rales, rhonchi, wheezing CARDIO: RRR with no MRGs, peripheral pulses intact ABDOMEN: Soft, non distended, active bowel sounds in all 4 quadrants, no tenderness to palpation, no rebound, no mass appreciated RECTAL: declines MUSCULOSKELETAL: Full ROM, normal gait, without edema SKIN: Dry, intact without rashes or lesions. No jaundice. NEURO: Alert, oriented, no focal deficits PSYCH: Cooperative, normal mood and affect.      Alan JONELLE Coombs,  PA-C 8:41 AM

## 2024-07-15 ENCOUNTER — Encounter: Payer: Self-pay | Admitting: Physician Assistant

## 2024-07-15 ENCOUNTER — Ambulatory Visit: Admitting: Physician Assistant

## 2024-07-15 VITALS — BP 120/78 | HR 81 | Ht 62.0 in | Wt 164.0 lb

## 2024-07-15 DIAGNOSIS — Z860100 Personal history of colon polyps, unspecified: Secondary | ICD-10-CM

## 2024-07-15 DIAGNOSIS — K449 Diaphragmatic hernia without obstruction or gangrene: Secondary | ICD-10-CM | POA: Diagnosis not present

## 2024-07-15 DIAGNOSIS — R112 Nausea with vomiting, unspecified: Secondary | ICD-10-CM | POA: Diagnosis not present

## 2024-07-15 DIAGNOSIS — R09A2 Foreign body sensation, throat: Secondary | ICD-10-CM

## 2024-07-15 DIAGNOSIS — K219 Gastro-esophageal reflux disease without esophagitis: Secondary | ICD-10-CM | POA: Diagnosis not present

## 2024-07-15 DIAGNOSIS — K2270 Barrett's esophagus without dysplasia: Secondary | ICD-10-CM

## 2024-07-15 NOTE — Patient Instructions (Addendum)
 Please take your proton pump inhibitor medication, protonix  40 mg  Please take this medication 30 minutes to 1 hour before meals- this makes it more effective.  Avoid spicy and acidic foods Avoid fatty foods Limit your intake of coffee, tea, alcohol, and carbonated drinks Work to maintain a healthy weight Keep the head of the bed elevated at least 3 inches with blocks or a wedge pillow if you are having any nighttime symptoms Stay upright for 2 hours after eating Avoid meals and snacks three to four hours before bedtime  Reflux Gourmet Rescue  It is an ALGINATE THERAPY which is the only intervention that works to safeguard the esophagus by creating a protective barrier that actually stops reflux from happening. -The general directions for use are as stated on the packaging: Take 1 teaspoon (5 ml), or more as needed or as directed by your physician, after meals and before bed. -These general directions address the most common times for reflux to occur, but our Rescue products may be taken anytime. Some individuals may take our product preemptively, when they know they will suffer from reflux, or as needed - when discomfort arises. (If taken around food, it should be consumed last.) -You do not have to take 1 teaspoon (5 ml) of the product. While one teaspoon (5ml) may be the perfect average amount to relieve reflux suffering in some, others may require more or less. You may adjust the amount of Mint Chocolate Rescue and Vanilla Caramel Rescue to the lowest amount necessary to meet your individual needs to improve your quality of life. -You may dilute the product if it is too viscous for you to consume. Keep in mind, however, that the thickness of the product was formulated to provide optimal coating and protection of your throat and esophagus. Though diluting the product is possible, it may reduce the protective function and/or length of action. -This can be used in conjunction with reflux  medications and lifestyle changes.  100% ALL-NATURAL  Paraben FREE, glycerin FREE, & potassium FREE  Made entirely from all-natural ingredients considered safe for children and during pregnancy  No known side effects  All-natural flavor Gluten FREE  Allergen FREE  Vegan  Can find more information here: NameSeizer.co.nz  Understanding Your Weekly GLP-1 Injection  A helpful guide to managing common side effects  You are on a once-weekly injectable medication in the GLP-1 receptor agonist class. These medications can be very effective for blood sugar control, weight loss, and heart protection, fatty liver or OSA, but they can also come with some side effects that are important to understand. The good news is: most side effects can be managed with a few adjustments.  1. Gastroparesis-Like Symptoms These medications slow down your stomach to help you feel full longer -- great for weight loss and blood sugar control, but they can sometimes cause symptoms that feel like gastroparesis (slow stomach emptying). Symptoms may include: -Feeling full quickly when eating -Nausea or vomiting -Bloating or abdominal discomfort -Worsening heartburn or reflux -Acid regurgitation -Stomach spasms or tightness What you can do: ??? Eat small, frequent meals (4-6 per day) ?? Drink fluids between meals, not during ?? Avoid high-fiber foods (like raw veggies or whole grains); cook your veggies well ?? Spread protein throughout the day (try [place] yogurt, eggs, soft meats, Glucerna, milk) ?? Choose soft foods you can mash with a fork ?? Switch to pured foods or liquids during flare-ups ?? Consider reading: Living Well with Gastroparesis by Camelia Bone ?? Downloadable  Diet Guide: [facility] Gastroparesis Diet PDF  ?? Tip: Try following a gastroparesis-friendly diet on the day of your injection and the day after, when the medication's effect is strongest.  2.  Constipation Since this medication slows down your digestive system, constipation is very common. Tips to help: ?? Drink plenty of water ???? Stay active with regular exercise ?? Add fiber-rich but gentle foods like kiwi ?? Try a low-dose magnesium supplement at night ?? Use MiraLAX  (half to one capful daily) if constipation becomes more frequent, especially if your dose increases  If these strategies don't help, talk to your provider -- they may recommend or prescribe other treatments.  3. When to Call the Doctor or Go to the ER While rare, this medication can slightly increase your risk of serious conditions like: Pancreatitis (inflammation of the pancreas) Gallstones or gallbladder problems Watch for these signs and seek help if you experience: Severe abdominal pain (especially in the upper belly or that radiates to your back) Pain in the right upper side of your abdomen Nausea, vomiting, fever, or chills that don't go away ?? Call your provider or go to the ER if these occur.  4. Who Should NOT Take This Medication? This medication should be avoided if you have: A personal history of pancreatitis  A personal or family history of medullary thyroid  cancer A condition called Multiple Endocrine Neoplasia Syndrome Type 2 (MEN2)  Final Note If the side effects are too bothersome, remember: Most symptoms will go away if you stop the medication. But many people tolerate it well after the first few weeks, especially with the right strategies in place.   What is Barrett's Esophagus? Barrett's esophagus is a condition in which the lining of the esophagus (the tube that carries food from your mouth to your stomach) changes due to long-term acid reflux (also known as GERD - gastroesophageal reflux disease). Instead of its normal lining, the esophagus develops tissue similar to the lining of the intestine. This condition increases the risk of developing esophageal adenocarcinoma, a rare but  serious form of cancer. However, most people with Barrett's esophagus do not develop cancer.  Causes and Risk Factors Chronic GERD (acid reflux) - the main cause Female gender Age over 9 Caucasian race Smoking Obesity, especially belly fat Family history of Barrett's esophagus or esophageal cancer  Symptoms Barrett's esophagus itself doesn't usually cause symptoms. Most symptoms are related to GERD: Heartburn Acid regurgitation (sour or bitter liquid backing up into your throat) Difficulty swallowing Chest pain (not related to heart disease) Note: Some people with Barrett's esophagus may not have noticeable reflux symptoms.  Barrett's esophagus is diagnosed using: Upper endoscopy (EGD): A thin, flexible tube with a camera is passed down your throat. Biopsy: Small tissue samples are taken from the esophagus lining and checked under a microscope.  Treatment Options There is no cure for Barrett's esophagus, but the goal is to manage GERD and prevent further changes or progression. Medications Proton pump inhibitors (PPIs): Reduce stomach acid (e.g., omeprazole, esomeprazole) H2 blockers and antacids for milder cases  Lifestyle Changes Eat smaller meals Avoid trigger foods (spicy, fatty, acidic foods; caffeine; alcohol) Don't lie down after eating Elevate the head of the bed Lose weight if overweight Stop smoking  Surveillance (Monitoring) Routine endoscopy every 3-5 years (or more often if precancerous changes are found)  Treatment for Advanced Cases If precancerous or cancerous cells are found, options may include: Endoscopic therapy (like radiofrequency ablation or endoscopic mucosal resection) Surgery to remove  part of the esophagus in rare cases  Living with Barrett's Esophagus Take medications as prescribed Follow up with regular endoscopies Maintain healthy lifestyle habits Be alert for new or worsening symptoms, such as trouble swallowing or unexplained weight  loss  When to Call Your Doctor Difficulty swallowing Chest pain not related to the heart Unexplained weight loss Vomiting blood or black stools  Remember: While Barrett's esophagus increases the risk of cancer, careful monitoring and treatment greatly reduce this risk. One potential complication of Barrett's esophagus is that, over time, the abnormal esophageal lining can develop early precancerous changes. The early changes may progress to advanced precancerous changes, and finally to frank esophageal cancer. If undetected, this cancer can spread and invade surrounding tissues. However, progression to cancer is uncommon for any individual patient; studies that follow patients with Barrett's esophagus reveal that fewer than 0.5 percent of patients develop esophageal cancer per year. Furthermore, patients with Barrett's esophagus appear to live approximately as long as people who are free of this condition.   Transoral incisionless fundoplication is a minimally invasive procedure to treat acid reflux, also known as heartburn, and other symptoms associated with chronic gastroesophageal reflux disease (GERD). These symptoms are caused by a malfunctioning valve that allows stomach acid into the esophagus.   One of our physicians does this procedure outpatient at [facility]. As discussed, I will send him your information and he will evaluate if you are candidate.  TIF Procedure Success Rate Multiple national studies have shown that the TIF procedure is performed successfully in up to 99% of patients, with just 2% experiencing issues during or after the procedure, such as a tear or internal bleeding. For a large number of patients, TIF provides significant GERD symptom relief and improved quality of life. Most patients (91%) also experience reduction in hiatal hernia and many (89%) are able to stop taking PPI medicine. Symptom relief after TIF typically lasts from eight to 10 years, which is similar  to relief in most cases of Nissen fundoplication treatments. When the GERD symptoms return, some people may need to repeat TIF, resume taking PPIs or explore other treatments.  For more information on the TIF procedure please visit the website at [email or web address]/. If you would like to view an informative video regarding the TIF procedure, please visit:  [email or web address].[email or web address]/[email or web address].   I appreciate the  opportunity to care for you  Thank You   [facility]

## 2024-07-17 NOTE — Progress Notes (Signed)
 Agree with the assessment and plan as outlined by Quentin Mulling, PA-C. ? ?Keron Neenan, DO, FACG ? ?

## 2024-07-21 DIAGNOSIS — F4323 Adjustment disorder with mixed anxiety and depressed mood: Secondary | ICD-10-CM | POA: Diagnosis not present

## 2024-08-04 ENCOUNTER — Other Ambulatory Visit: Payer: Self-pay

## 2024-08-04 ENCOUNTER — Other Ambulatory Visit (HOSPITAL_COMMUNITY): Payer: Self-pay

## 2024-08-05 ENCOUNTER — Other Ambulatory Visit (HOSPITAL_COMMUNITY): Payer: Self-pay

## 2024-08-05 DIAGNOSIS — Z79899 Other long term (current) drug therapy: Secondary | ICD-10-CM | POA: Diagnosis not present

## 2024-08-05 DIAGNOSIS — F338 Other recurrent depressive disorders: Secondary | ICD-10-CM | POA: Diagnosis not present

## 2024-08-05 DIAGNOSIS — Z5181 Encounter for therapeutic drug level monitoring: Secondary | ICD-10-CM | POA: Diagnosis not present

## 2024-08-05 DIAGNOSIS — F410 Panic disorder [episodic paroxysmal anxiety] without agoraphobia: Secondary | ICD-10-CM | POA: Diagnosis not present

## 2024-08-05 DIAGNOSIS — F411 Generalized anxiety disorder: Secondary | ICD-10-CM | POA: Diagnosis not present

## 2024-08-05 MED ORDER — DULOXETINE HCL 30 MG PO CPEP
30.0000 mg | ORAL_CAPSULE | Freq: Every day | ORAL | 1 refills | Status: AC
  Filled 2024-08-05: qty 30, 30d supply, fill #0

## 2024-08-07 ENCOUNTER — Other Ambulatory Visit (HOSPITAL_COMMUNITY): Payer: Self-pay

## 2024-08-07 ENCOUNTER — Encounter (HOSPITAL_BASED_OUTPATIENT_CLINIC_OR_DEPARTMENT_OTHER): Payer: Self-pay

## 2024-08-07 DIAGNOSIS — E119 Type 2 diabetes mellitus without complications: Secondary | ICD-10-CM

## 2024-08-07 MED ORDER — TIRZEPATIDE 12.5 MG/0.5ML ~~LOC~~ SOAJ
12.5000 mg | SUBCUTANEOUS | 1 refills | Status: DC
  Filled 2024-08-07: qty 2, 28d supply, fill #0

## 2024-08-26 DIAGNOSIS — F4323 Adjustment disorder with mixed anxiety and depressed mood: Secondary | ICD-10-CM | POA: Diagnosis not present

## 2024-09-01 ENCOUNTER — Other Ambulatory Visit (HOSPITAL_COMMUNITY): Payer: Self-pay

## 2024-09-01 MED ORDER — DULOXETINE HCL 30 MG PO CPEP
30.0000 mg | ORAL_CAPSULE | Freq: Every day | ORAL | 0 refills | Status: DC
  Filled 2024-09-01: qty 90, 90d supply, fill #0

## 2024-09-01 MED ORDER — TIRZEPATIDE 15 MG/0.5ML ~~LOC~~ SOAJ
15.0000 mg | SUBCUTANEOUS | 2 refills | Status: AC
  Filled 2024-09-01: qty 2, 28d supply, fill #0
  Filled 2024-09-25: qty 2, 28d supply, fill #1
  Filled 2024-10-28: qty 2, 28d supply, fill #2

## 2024-09-01 MED ORDER — DULOXETINE HCL 60 MG PO CPEP
60.0000 mg | ORAL_CAPSULE | Freq: Every day | ORAL | 0 refills | Status: DC
  Filled 2024-09-01 – 2024-09-05 (×2): qty 90, 90d supply, fill #0

## 2024-09-01 NOTE — Addendum Note (Signed)
 Addended by: Simone Tuckey on: 09/01/2024 08:08 AM   Modules accepted: Orders

## 2024-09-05 ENCOUNTER — Other Ambulatory Visit (HOSPITAL_COMMUNITY): Payer: Self-pay

## 2024-09-05 ENCOUNTER — Other Ambulatory Visit: Payer: Self-pay

## 2024-09-16 DIAGNOSIS — F4323 Adjustment disorder with mixed anxiety and depressed mood: Secondary | ICD-10-CM | POA: Diagnosis not present

## 2024-09-25 ENCOUNTER — Other Ambulatory Visit: Payer: Self-pay

## 2024-09-25 ENCOUNTER — Other Ambulatory Visit (HOSPITAL_COMMUNITY): Payer: Self-pay

## 2024-09-30 ENCOUNTER — Other Ambulatory Visit (HOSPITAL_COMMUNITY): Payer: Self-pay

## 2024-10-03 ENCOUNTER — Ambulatory Visit: Admitting: Family

## 2024-10-14 ENCOUNTER — Telehealth: Payer: Self-pay | Admitting: Family

## 2024-10-14 ENCOUNTER — Encounter: Payer: Self-pay | Admitting: *Deleted

## 2024-10-14 ENCOUNTER — Ambulatory Visit: Admitting: Family

## 2024-10-14 VITALS — BP 127/81 | HR 78 | Temp 98.0°F | Resp 16 | Ht 62.0 in | Wt 161.0 lb

## 2024-10-14 DIAGNOSIS — Z7985 Long-term (current) use of injectable non-insulin antidiabetic drugs: Secondary | ICD-10-CM

## 2024-10-14 DIAGNOSIS — E782 Mixed hyperlipidemia: Secondary | ICD-10-CM | POA: Diagnosis not present

## 2024-10-14 DIAGNOSIS — F418 Other specified anxiety disorders: Secondary | ICD-10-CM | POA: Diagnosis not present

## 2024-10-14 DIAGNOSIS — E119 Type 2 diabetes mellitus without complications: Secondary | ICD-10-CM

## 2024-10-14 NOTE — Progress Notes (Signed)
 "  Subjective:     Patient ID: Jane Montes, female    DOB: 16-Dec-1970, 54 y.o.   MRN: [id number]  Chief Complaint  Patient presents with   Diabetes    Here for follow up    Diabetes    Discussed the use of AI scribe software for clinical note transcription with the patient, who gave verbal consent to proceed.  History of Present Illness Jane Montes is a 54 year old female with type 2 diabetes and fatty liver disease who presents for medication follow-up.  Her diabetes management is ongoing with Mounjaro , and her last A1c was 5.7 in March. She has experienced a weight gain of six pounds, which she attributes to the holiday season and reduced physical activity. Despite this, she believes the weight loss has positively impacted her fatty liver condition.  Her mood remains stable on Cymbalta , and she has reduced her use of Xanax  since the dosage of Cymbalta  was increased. She no longer takes trazodone  daily, opting for a half dose of 25 mg when needed.  She has noticed a decrease in urine output but denies any pain or changes in her known kidney condition. Her creatinine was previously slightly elevated.  She continues to take Crestor  for cholesterol management, with satisfactory levels last checked over the summer. Her blood pressure is stable without specific medication.  Her last Pap smear was over two years ago, and she had a mammogram in July. She received the hepatitis B series and a flu shot on October 31st. Her last eye exam was in May. She had a past reaction to the shingles vaccine administered alongside the Prevnar 13 vaccine and has not yet received the Prevnar 20 booster.     Health Maintenance Due  Topic Date Due   COVID-19 Vaccine (3 - Pfizer risk series) 12/11/2019   Pneumococcal Vaccine: 50+ Years (2 of 2 - PPSV23, PCV20, or PCV21) 03/14/2022   Cervical Cancer Screening (HPV/Pap Cotest)  07/24/2022   OPHTHALMOLOGY EXAM  02/14/2024   HEMOGLOBIN A1C  06/29/2024     Past Medical History:  Diagnosis Date   Adenoma of right adrenal gland 10/12/2018   Noted on MRI Abd, measuring 2.2 x 2.4 x 2.7 cm   Anxiety    Aortic atherosclerosis 10/10/2018   Noted on US    Diabetes mellitus without complication (HCC)    Fatty liver 10/12/2018   Noted on MRI Abd   Fatty liver 09/29/2020   GERD (gastroesophageal reflux disease) 09/16/2015   Mod. noted on KUB   Heart palpitations    High cholesterol    History of chest pain    History of dizziness    History of hiatal hernia 09/16/2015   Small, noted on KUB   History of kidney stones    History of migraine    Hydronephrosis of right kidney 10/12/2018   Mild, noted on MRI Abd   Hyperlipidemia    Left anterior fascicular block (LAFB) 01/10/2013   noted on EKG   Left kidney mass 10/12/2018   Hyperechoic mass mid left kidney measuring 1.0 x 1.0 x 1.2 cm, noted on US  AB   Nephrolithiasis 10/12/2018   Right, Noted on MRI Abd   Pelvic pain    Chronic, right lower quadrant   Pyelonephritis 11/23/2022   Syncope 11/24/2022   Wears contact lenses    Wears glasses     Past Surgical History:  Procedure Laterality Date   ABDOMINAL HYSTERECTOMY     partial  CHOLECYSTECTOMY N/A 11/18/2020   Procedure: LAPAROSCOPIC CHOLECYSTECTOMY;  Surgeon: Vernetta Berg, MD;  Location: [facility];  Service: General;  Laterality: N/A;   ENDOMETRIAL ABLATION     Pelvic   HOLMIUM LASER APPLICATION Right 11/15/2018   Procedure: HOLMIUM LASER APPLICATION;  Surgeon: Nieves Cough, MD;  Location: [facility];  Service: Urology;  Laterality: Right;   URETEROSCOPY WITH HOLMIUM LASER LITHOTRIPSY Right 11/15/2018   Procedure: CYSTOSCOPY, RETROGRADE, URETEROSCOPY WITH HOLMIUM LASER LITHOTRIPSY/STENT PLACEMENT;  Surgeon: Nieves Cough, MD;  Location: [facility];  Service: Urology;  Laterality: Right;   VAGINAL HYSTERECTOMY  2007   Partial   WISDOM TOOTH EXTRACTION      Family  History  Problem Relation Age of Onset   Diabetes Mother        [name] (recent diagnosis) causes thrombocytosis   Heart attack Mother 80       died from sepsis/osteomyelitis   Heart disease Mother    Seizures Mother    Dementia Mother    Hypertension Father    COPD Father    Bipolar disorder Father    Parkinson's disease Father    Cervical cancer Sister    Alcohol abuse Brother    COPD Brother    Hyperlipidemia Brother    Hypertension Brother    Stroke Brother    Heart attack Brother    COPD Maternal Grandmother    Hyperlipidemia Maternal Grandmother    Heart attack Maternal Grandmother    Depression Paternal Grandmother    Heart disease Paternal Grandmother    Heart attack Paternal Grandmother    Colon cancer Neg Hx    Esophageal cancer Neg Hx    Stomach cancer Neg Hx    Rectal cancer Neg Hx     Social History   Socioeconomic History   Marital status: Divorced    Spouse name: Not on file   Number of children: Not on file   Years of education: Not on file   Highest education level: Not on file  Occupational History   Occupation: Nurse    Employer: [employer]  Tobacco Use   Smoking status: Never   Smokeless tobacco: Never  Vaping Use   Vaping status: Never Used  Substance and Sexual Activity   Alcohol use: Yes    Comment: occ   Drug use: No   Sexual activity: Not Currently    Birth control/protection: Surgical  Other Topics Concern   Not on file  Social History Narrative   Works for Heart failure team at [employer]   2 daughters   Divorced   Enjoys reading/working out, spending time outdoors.     Social Drivers of Health   Tobacco Use: Low Risk (05/15/2024)   Patient History    Smoking Tobacco Use: Never    Smokeless Tobacco Use: Never    Passive Exposure: Not on file  Financial Resource Strain: Not on file  Food Insecurity: No Food Insecurity (11/23/2022)   Hunger Vital Sign    Worried About Running Out of Food in the Last Year: Never true     Ran Out of Food in the Last Year: Never true  Transportation Needs: No Transportation Needs (11/23/2022)   PRAPARE - Administrator, Civil Service (Medical): No    Lack of Transportation (Non-Medical): No  Physical Activity: Not on file  Stress: Not on file  Social Connections: Not on file  Intimate Partner Violence: Not At Risk (11/23/2022)   Humiliation, Afraid, Rape,  and Kick questionnaire    Fear of Current or Ex-Partner: No    Emotionally Abused: No    Physically Abused: No    Sexually Abused: No  Depression (PHQ2-9): Low Risk (10/14/2024)   Depression (PHQ2-9)    PHQ-2 Score: 1  Alcohol Screen: Not on file  Housing: Low Risk (11/23/2022)   Housing    Last Housing Risk Score: 0  Utilities: Not At Risk (11/23/2022)   AHC Utilities    Threatened with loss of utilities: No  Health Literacy: Not on file    Outpatient Medications Prior to Visit  Medication Sig Dispense Refill   ALPRAZolam  (XANAX ) 0.5 MG tablet Take 1 tablet (0.5 mg total) by mouth daily as needed for extreme anxiety. 10 tablet 1   DULoxetine  (CYMBALTA ) 30 MG capsule Take 1 capsule (30 mg total) by mouth daily with duloxetine  60 mg for a total of 90 mg. 30 capsule 1   DULoxetine  (CYMBALTA ) 60 MG capsule Take 1 capsule (60 mg total) by mouth daily with Duloxetine  30 mg for a total of 90 mg. 90 capsule 0   fenofibrate  (TRICOR ) 145 MG tablet Take 1 tablet (145 mg total) by mouth daily. 90 tablet 3   indapamide  (LOZOL ) 1.25 MG tablet Take 1 tablet (1.25 mg total) by mouth every morning. 90 tablet 3   Multiple Vitamin (MULTIVITAMIN ADULT PO)      pantoprazole  (PROTONIX ) 40 MG tablet Take 1 tablet (40 mg total) by mouth daily. 90 tablet 3   rosuvastatin  (CRESTOR ) 20 MG tablet Take 1 tablet (20 mg total) by mouth daily. 90 tablet 3   tirzepatide  (MOUNJARO ) 15 MG/0.5ML Pen Inject 15 mg into the skin once a week. 2 mL 2   traZODone  (DESYREL ) 50 MG tablet Take 0.5-1 tablets (25-50 mg total) by mouth at bedtime as  needed for sleep. 30 tablet 1   DULoxetine  (CYMBALTA ) 30 MG capsule Take 1 capsule (30 mg total) by mouth daily with Duloxetine  60 mg for a total of 90 mg. 90 capsule 0   indapamide  (LOZOL ) 1.25 MG tablet Take 1 tablet (1.25 mg total) by mouth every morning. (Patient not taking: Reported on 07/15/2024) 90 tablet 3   traZODone  (DESYREL ) 50 MG tablet Take 1/2-1 tablet (25-50 mg total) by mouth at bedtime as needed for sleep. (Patient not taking: Reported on 07/15/2024) 90 tablet 0   No facility-administered medications prior to visit.    Allergies[1]  ROS    See HPI Objective:    Physical Exam Constitutional:      General: She is not in acute distress.    Appearance: Normal appearance. She is well-developed.  HENT:     Head: Normocephalic and atraumatic.     Right Ear: External ear normal.     Left Ear: External ear normal.  Eyes:     General: No scleral icterus. Neck:     Thyroid : No thyromegaly.  Cardiovascular:     Rate and Rhythm: Normal rate and regular rhythm.     Heart sounds: Normal heart sounds. No murmur heard. Pulmonary:     Effort: Pulmonary effort is normal. No respiratory distress.     Breath sounds: Normal breath sounds. No wheezing.  Musculoskeletal:     Cervical back: Neck supple.  Skin:    General: Skin is warm and dry.  Neurological:     Mental Status: She is alert and oriented to person, place, and time.  Psychiatric:        Mood and Affect: Mood normal.  Behavior: Behavior normal.        Thought Content: Thought content normal.        Judgment: Judgment normal.      BP 127/81 (BP Location: Right Arm, Patient Position: Sitting, Cuff Size: Normal)   Pulse 78   Temp 98 F (36.7 C) (Oral)   Resp 16   Ht 5' 2 (1.575 m)   Wt 161 lb (73 kg)   SpO2 100%   BMI 29.45 kg/m  Wt Readings from Last 3 Encounters:  10/14/24 161 lb (73 kg)  07/15/24 164 lb (74.4 kg)  05/15/24 157 lb (71.2 kg)       Assessment & Plan:   Problem List Items  Addressed This Visit       Unprioritized   Hyperlipidemia   Lab Results  Component Value Date   CHOL 140 04/21/2024   HDL 52 04/21/2024   LDLCALC 66 04/21/2024   LDLDIRECT 87.0 03/30/2021   TRIG 124 04/21/2024   CHOLHDL 2.7 04/21/2024  Last lipid panel stable on crestor .  Monitor.        Depression with anxiety   Reports stable mood on cymbalta .  Rarely needing the xanax .       Controlled type 2 diabetes mellitus without complication, without long-term current use of insulin  (HCC) - Primary   Lab Results  Component Value Date   HGBA1C 5.7 12/28/2023   HGBA1C 5.4 11/24/2022   HGBA1C 6.0 12/30/2021   Lab Results  Component Value Date   MICROALBUR <0.7 12/28/2023   LDLCALC 66 04/21/2024   CREATININE 1.21 (H) 04/21/2024   Clinically stable on mounjaro .  Continue same.        Relevant Orders   Comp Met (CMET)   Urine Microalbumin w/creat. ratio   HgB A1c   Assessment & Plan  General Health Maintenance Hepatitis B series completed. Prevnar 13 received. Shingles vaccine caused reaction. Flu shot received in October. Last eye exam in May. - Consider Prevnar 20 booster after daughter's birth. - Continue annual mammograms and biennial Pap smears. - Continue routine eye exams.    I am having Jane Montes maintain her indapamide , Multiple Vitamin (MULTIVITAMIN ADULT PO), traZODone , rosuvastatin , fenofibrate , pantoprazole , ALPRAZolam , DULoxetine , tirzepatide , and DULoxetine .  No orders of the defined types were placed in this encounter.     [1]  Allergies Allergen Reactions   Cephalexin Hives, Nausea Only and Rash   "

## 2024-10-14 NOTE — Telephone Encounter (Signed)
 Please request pap from [facility] office.

## 2024-10-14 NOTE — Assessment & Plan Note (Signed)
 BP looks good without medications.

## 2024-10-14 NOTE — Telephone Encounter (Signed)
 Also, request DM eye exam from [facility] in [facility].

## 2024-10-14 NOTE — Assessment & Plan Note (Signed)
 Reports stable mood on cymbalta .  Rarely needing the xanax .

## 2024-10-14 NOTE — Assessment & Plan Note (Signed)
 Lab Results  Component Value Date   HGBA1C 5.7 12/28/2023   HGBA1C 5.4 11/24/2022   HGBA1C 6.0 12/30/2021   Lab Results  Component Value Date   MICROALBUR <0.7 12/28/2023   LDLCALC 66 04/21/2024   CREATININE 1.21 (H) 04/21/2024   Clinically stable on mounjaro .  Continue same.

## 2024-10-14 NOTE — Patient Instructions (Signed)
" °  VISIT SUMMARY: Today, we reviewed your ongoing management for type 2 diabetes, depression with anxiety, mixed hyperlipidemia, and fatty liver disease. We also discussed your general health maintenance and addressed your recent weight gain and changes in urine output.  YOUR PLAN: -TYPE 2 DIABETES MELLITUS: Type 2 diabetes is a condition where your body does not use insulin  properly, leading to high blood sugar levels. Your diabetes is well-controlled with an A1c of 5.7%. Continue taking Tirzepatide  (Mounjaro ) 10 mg subcutaneously once a week. Try to return to your regular exercise routine to help manage your weight.  -DEPRESSION WITH ANXIETY: Depression with anxiety is a mental health condition that affects your mood and can cause feelings of sadness and worry. Your mood is well-managed with your current medication. Continue taking Duloxetine  (Cymbalta ) 60 mg orally daily. Use Xanax  and Trazodone  as needed.  -MIXED HYPERLIPIDEMIA: Mixed hyperlipidemia is a condition where you have high levels of different types of fats in your blood. Your cholesterol levels are stable. Continue taking Rosuvastatin  (Crestor ) 20 mg orally daily.  -FATTY LIVER: Fatty liver is a condition where fat builds up in your liver. Your weight loss is likely beneficial for this condition. We have ordered a metabolic panel and liver function test to monitor your liver health.  -NEPHROLITHIASIS: Nephrolithiasis is the condition of having kidney stones. You have no current symptoms or changes in kidney function. We have ordered a urine microalbumin test to check your kidney health.  -GENERAL HEALTH MAINTENANCE: You have completed the Hepatitis B series and received the flu shot in October. You had a reaction to the shingles vaccine and have not yet received the Prevnar 20 booster. Continue with annual mammograms, biennial Pap smears, and routine eye exams. Consider getting the Prevnar 20 booster after your daughter's  birth.  INSTRUCTIONS: Please follow up with the ordered metabolic panel, liver function test, and urine microalbumin test. Continue with your regular health maintenance schedule, including annual mammograms, biennial Pap smears, and routine eye exams. Consider the Prevnar 20 booster after your daughter's birth.                 "

## 2024-10-14 NOTE — Assessment & Plan Note (Addendum)
 Lab Results  Component Value Date   CHOL 140 04/21/2024   HDL 52 04/21/2024   LDLCALC 66 04/21/2024   LDLDIRECT 87.0 03/30/2021   TRIG 124 04/21/2024   CHOLHDL 2.7 04/21/2024  Last lipid panel stable on crestor .  Monitor.

## 2024-10-15 ENCOUNTER — Ambulatory Visit: Payer: Self-pay | Admitting: Family

## 2024-10-15 LAB — COMPREHENSIVE METABOLIC PANEL WITH GFR
ALT: 15 U/L (ref 3–35)
AST: 20 U/L (ref 5–37)
Albumin: 4.5 g/dL (ref 3.5–5.2)
Alkaline Phosphatase: 50 U/L (ref 39–117)
BUN: 27 mg/dL — ABNORMAL HIGH (ref 6–23)
CO2: 30 meq/L (ref 19–32)
Calcium: 10 mg/dL (ref 8.4–10.5)
Chloride: 103 meq/L (ref 96–112)
Creatinine, Ser: 1.17 mg/dL (ref 0.40–1.20)
GFR: 53.29 mL/min — ABNORMAL LOW
Glucose, Bld: 93 mg/dL (ref 70–99)
Potassium: 3.7 meq/L (ref 3.5–5.1)
Sodium: 140 meq/L (ref 135–145)
Total Bilirubin: 0.4 mg/dL (ref 0.2–1.2)
Total Protein: 6.8 g/dL (ref 6.0–8.3)

## 2024-10-15 LAB — MICROALBUMIN / CREATININE URINE RATIO
Creatinine,U: 95.8 mg/dL
Microalb Creat Ratio: UNDETERMINED mg/g (ref 0.0–30.0)
Microalb, Ur: <0.7 mg/dL

## 2024-10-15 LAB — HEMOGLOBIN A1C: Hgb A1c MFr Bld: 5.7 % (ref 4.6–6.5)

## 2024-10-20 NOTE — Telephone Encounter (Signed)
 Fax request sent over already for DM eye exam

## 2024-10-28 ENCOUNTER — Other Ambulatory Visit (HOSPITAL_COMMUNITY): Payer: Self-pay

## 2024-10-28 ENCOUNTER — Encounter: Payer: Self-pay | Admitting: Pharmacist

## 2024-11-03 ENCOUNTER — Other Ambulatory Visit (HOSPITAL_COMMUNITY): Payer: Self-pay

## 2024-11-03 MED ORDER — DULOXETINE HCL 30 MG PO CPEP
30.0000 mg | ORAL_CAPSULE | Freq: Every day | ORAL | 1 refills | Status: AC
  Filled 2024-11-03: qty 90, 90d supply, fill #0

## 2024-11-03 MED ORDER — DULOXETINE HCL 60 MG PO CPEP
60.0000 mg | ORAL_CAPSULE | Freq: Every day | ORAL | 1 refills | Status: AC
  Filled 2024-11-03: qty 90, 90d supply, fill #0

## 2024-11-03 MED ORDER — ALPRAZOLAM 0.5 MG PO TABS
0.5000 mg | ORAL_TABLET | Freq: Every day | ORAL | 2 refills | Status: AC | PRN
  Filled 2024-11-03: qty 10, 10d supply, fill #0
  Filled 2024-11-05: qty 10, 30d supply, fill #0

## 2024-11-04 ENCOUNTER — Other Ambulatory Visit (HOSPITAL_COMMUNITY): Payer: Self-pay

## 2024-11-05 ENCOUNTER — Other Ambulatory Visit (HOSPITAL_COMMUNITY): Payer: Self-pay

## 2024-11-06 ENCOUNTER — Other Ambulatory Visit (HOSPITAL_COMMUNITY): Payer: Self-pay

## 2025-04-21 ENCOUNTER — Encounter: Admitting: Family
